# Patient Record
Sex: Male | Born: 1960 | Race: White | Hispanic: No | Marital: Married | Smoking: Current every day smoker
Health system: Southern US, Community
[De-identification: ages and names within clinical notes are randomized; demographics above are authoritative.]

## PROBLEM LIST (undated history)

## (undated) DIAGNOSIS — E785 Hyperlipidemia, unspecified: Secondary | ICD-10-CM

## (undated) DIAGNOSIS — I739 Peripheral vascular disease, unspecified: Secondary | ICD-10-CM

## (undated) HISTORY — PX: SHOULDER SURGERY: SHX246

## (undated) HISTORY — DX: Hyperlipidemia, unspecified: E78.5

## (undated) HISTORY — DX: Peripheral vascular disease, unspecified: I73.9

## (undated) HISTORY — PX: OTHER SURGICAL HISTORY: SHX169

## (undated) HISTORY — PX: TONSILLECTOMY: SUR1361

---

## 2001-02-23 ENCOUNTER — Emergency Department (HOSPITAL_COMMUNITY): Admission: EM | Admit: 2001-02-23 | Discharge: 2001-02-23 | Payer: Self-pay | Admitting: Emergency Medicine

## 2016-03-27 DIAGNOSIS — G5603 Carpal tunnel syndrome, bilateral upper limbs: Secondary | ICD-10-CM | POA: Insufficient documentation

## 2016-12-31 LAB — LIPID PANEL
Cholesterol: 165 mg/dL (ref 0–200)
HDL: 66 mg/dL (ref 35–70)
LDL CALC: 88 mg/dL
Triglycerides: 63 mg/dL (ref 40–160)

## 2017-01-06 ENCOUNTER — Ambulatory Visit: Payer: Self-pay | Admitting: Nurse Practitioner

## 2017-01-06 ENCOUNTER — Ambulatory Visit (INDEPENDENT_AMBULATORY_CARE_PROVIDER_SITE_OTHER)
Admission: RE | Admit: 2017-01-06 | Discharge: 2017-01-06 | Disposition: A | Discharge status: Home / Self Care | Payer: BLUE CROSS/BLUE SHIELD | Source: Ambulatory Visit | Attending: Nurse Practitioner | Admitting: Nurse Practitioner

## 2017-01-06 ENCOUNTER — Ambulatory Visit (INDEPENDENT_AMBULATORY_CARE_PROVIDER_SITE_OTHER): Payer: BLUE CROSS/BLUE SHIELD | Admitting: Nurse Practitioner

## 2017-01-06 ENCOUNTER — Other Ambulatory Visit: Payer: Self-pay | Admitting: Nurse Practitioner

## 2017-01-06 ENCOUNTER — Encounter: Payer: Self-pay | Admitting: Nurse Practitioner

## 2017-01-06 VITALS — BP 164/86 | HR 75 | Temp 98.3°F | Ht 69.0 in | Wt 162.0 lb

## 2017-01-06 DIAGNOSIS — G8929 Other chronic pain: Secondary | ICD-10-CM | POA: Diagnosis not present

## 2017-01-06 DIAGNOSIS — M5416 Radiculopathy, lumbar region: Secondary | ICD-10-CM

## 2017-01-06 DIAGNOSIS — I739 Peripheral vascular disease, unspecified: Secondary | ICD-10-CM | POA: Diagnosis not present

## 2017-01-06 DIAGNOSIS — M25552 Pain in left hip: Secondary | ICD-10-CM

## 2017-01-06 DIAGNOSIS — F17200 Nicotine dependence, unspecified, uncomplicated: Secondary | ICD-10-CM

## 2017-01-06 DIAGNOSIS — M51369 Other intervertebral disc degeneration, lumbar region without mention of lumbar back pain or lower extremity pain: Secondary | ICD-10-CM

## 2017-01-06 DIAGNOSIS — M5136 Other intervertebral disc degeneration, lumbar region: Secondary | ICD-10-CM | POA: Diagnosis not present

## 2017-01-06 MED ORDER — GABAPENTIN 100 MG PO CAPS: 100.0000 mg | ORAL_CAPSULE | Freq: Three times a day (TID) | ORAL | 1 refills | Status: DC

## 2017-01-06 MED ORDER — ATORVASTATIN CALCIUM 20 MG PO TABS: 20.0000 mg | ORAL_TABLET | Freq: Every day | ORAL | 3 refills | Status: DC

## 2017-01-06 MED ORDER — ATORVASTATIN CALCIUM 40 MG PO TABS: 40.0000 mg | ORAL_TABLET | Freq: Every day | ORAL | 3 refills | Status: DC

## 2017-01-06 MED ORDER — PREDNISONE 10 MG (21) PO TBPK: ORAL_TABLET | ORAL | 0 refills | Status: DC

## 2017-01-06 MED ORDER — CILOSTAZOL 100 MG PO TABS: 100.0000 mg | ORAL_TABLET | Freq: Two times a day (BID) | ORAL | 3 refills | Status: DC

## 2017-01-06 NOTE — Progress Notes (Signed)
Pre visit review using our clinic review tool, if applicable. No additional management support is needed unless otherwise documented below in the visit note. 

## 2017-01-06 NOTE — Patient Instructions (Addendum)
Go to basement for x-ray. You will be called with results.  Please sign release to get lab results from wellness visit.   PAD vs Lumbar radiculopathy? Or a combination of both. Refer to vascular surgeon if severe PAD per ABI.  Intermittent Claudication Intermittent claudication is pain in your leg that occurs when you walk or exercise and goes away when you rest. The pain can occur in one or both legs. What are the causes? Intermittent claudication is caused by the buildup of plaque within the major arteries in the body (atherosclerosis). The plaque, which makes arteries stiff and narrow, prevents enough blood from reaching your leg muscles. The pain occurs when you walk or exercise because your muscles need more blood when you are moving and exercising. What increases the risk? Risk factors include:  A family history of atherosclerosis.  A personal history of stroke or heart disease.  Older age.  Being inactive or overweight.  Smoking cigarettes.  Having another health condition such as:  Diabetes.  High blood pressure.  High cholesterol. What are the signs or symptoms? Your hip or leg may:  Ache.  Cramp.  Feel tight.  Feel weak.  Feel heavy. Over time, you may feel pain in your calf, thigh, or hip. How is this diagnosed? Your health care provider may diagnose intermittent claudication based on your symptoms and medical history. Your health care provider may also do tests to learn more about your condition. These may include:  Blood tests.  An ultrasound.  Imaging tests such as angiography, magnetic resonance angiography (MRA), and computed tomography angiography (CTA). How is this treated? You may be treated for problems such as:  High blood pressure.  High cholesterol.  Diabetes. Other treatments may include:  Lifestyle changes such as:  Starting an exercise program.  Losing weight.  Quitting smoking.  Medicines to help restore blood flow  through your legs.  Blood vessel surgery (angioplasty) to restore blood flow if your intermittent claudication is caused by severe peripheral artery disease. Follow these instructions at home:  Manage any other health conditions you have.  Eat a diet low in saturated fats and calories to maintain a healthy weight.  Quit smoking, if you smoke.  Take medicines only as directed by your health care provider.  If your health care provider recommended an exercise program for you, follow it as directed. Your exercise program may involve:  Walking three or more times a week.  Walking until you have certain symptoms of intermittent claudication.  Resting until symptoms go away.  Gradually increasing walking time to about 50 minutes a day. Contact a health care provider if: Your condition is not getting better or is getting worse. Get help right away if:  You have chest pain.  You have difficulty breathing.  You develop arm weakness.  You have trouble speaking.  Your face begins to droop. This information is not intended to replace advice given to you by your health care provider. Make sure you discuss any questions you have with your health care provider. Document Released: 08/15/2004 Document Revised: 03/20/2016 Document Reviewed: 01/19/2014 Elsevier Interactive Patient Education  2017 [employer]

## 2017-01-06 NOTE — Progress Notes (Signed)
Subjective:    Patient ID: Eddie Chan, male    DOB: 12-11-1960, 56 y.o.   MRN: [id number]  Patient presents today for establish care (new patient)  Back Pain  This is a chronic problem. Episode onset: 6months ago. The problem occurs intermittently. The problem has been gradually worsening since onset. Pain location: left gluteal and hip. The quality of the pain is described as aching and shooting. The pain radiates to the left foot and left knee. The pain is at a severity of 10/10. The pain is severe. The pain is worse during the night. The symptoms are aggravated by lying down (and walking). Associated symptoms include leg pain, numbness, paresthesias and tingling. Pertinent negatives include no abdominal pain, bladder incontinence, bowel incontinence, chest pain, dysuria, fever, headaches, paresis, pelvic pain, perianal numbness, weakness or weight loss. 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 He has tried NSAIDs for the symptoms. The treatment provided no relief.  wears wallet on right side.   CPE done last week, labs done.  Immunizations: (TDAP, Hep C screen, Pneumovax, Influenza, zoster)  Health Maintenance  Topic Date Due  .  Hepatitis C: One time screening is recommended by Center for Disease Control  (CDC) for  adults born from [birth year] through 1966.   02-May-1961  . HIV Screening  04/30/1975  . Colon Cancer Screening  04/29/2010  . Flu Shot  01/24/2017*  . Tetanus Vaccine  03/27/2026  *Topic was postponed. The date shown is not the original due date.   Diet:regular Weight:  Wt Readings from Last 3 Encounters:  01/06/17 162 lb (73.5 kg)   Exercise:none Fall Risk: Fall Risk  01/06/2017  Falls in the past year? Yes  Number falls in past yr: 1  Injury with Fall? No   Home Safety:home with wife Depression/Suicide: Depression screen [facility] 2/9 01/06/2017  Decreased Interest 0  Down, Depressed, Hopeless 0  PHQ - 2 Score 0   No flowsheet data  found. Colonoscopy (every 5-31yrs, >50-9yrs):needed PSA (yearly, >32yrs):needed Vision:needed Dental:needed Sexual History (birth control, marital status, STD):married, sexually active  Medications and allergies reviewed with patient and updated if appropriate.  Patient Active Problem List   Diagnosis Date Noted  . DDD (degenerative disc disease), lumbar 01/06/2017  . Tobacco use disorder 01/06/2017  . Chronic radicular pain of lower back 01/06/2017  . Chronic left hip pain 01/06/2017  . Intermittent claudication (HCC) 01/06/2017    No current outpatient prescriptions on file prior to visit.   No current facility-administered medications on file prior to visit.     History reviewed. No pertinent past medical history.  Past Surgical History:  Procedure Laterality Date  . SHOULDER SURGERY      Social History   Social History  . Marital status: Single    Spouse name: N/A  . Number of children: N/A  . Years of education: N/A   Social History Main Topics  . Smoking status: Current Every Day Smoker    Packs/day: 0.50    Years: 20.00    Types: Cigarettes  . Smokeless tobacco: Never Used     Comment: quit in past then resumed, nothing used  . Alcohol use 1.8 oz/week    3 Cans of beer per week     Comment: social  . Drug use: No  . Sexual activity: Yes    Birth control/ protection: None   Other Topics Concern  . None   Social History Narrative  . None    Family  History  Problem Relation Age of Onset  . Cancer Mother     lung cancer  . Heart disease Father   . Diabetes Father         Review of Systems  Constitutional: Negative for fever, malaise/fatigue and weight loss.  HENT: Negative for congestion and sore throat.   Eyes:       Negative for visual changes  Respiratory: Negative for cough and shortness of breath.   Cardiovascular: Negative for chest pain, palpitations and leg swelling.  Gastrointestinal: Negative for abdominal pain, blood in stool,  bowel incontinence, constipation, diarrhea and heartburn.  Genitourinary: Negative for bladder incontinence, dysuria, frequency, pelvic pain and urgency.  Musculoskeletal: Positive for back pain. Negative for falls, joint pain and myalgias.  Skin: Negative for rash.  Neurological: Positive for tingling, numbness and paresthesias. Negative for dizziness, sensory change, weakness and headaches.  Endo/Heme/Allergies: Does not bruise/bleed easily.  Psychiatric/Behavioral: Negative for depression, substance abuse and suicidal ideas. The patient is not nervous/anxious.     Objective:   Vitals:   01/06/17 1016  BP: (!) 164/86  Pulse: 75  Temp: 98.3 F (36.8 C)    Body mass index is 23.92 kg/m.   Physical Examination:  Physical Exam  Constitutional: He is oriented to person, place, and time. No distress.  Neck: Neck supple. Thyromegaly present.  Cardiovascular: Normal rate, regular rhythm and normal heart sounds.   Pulmonary/Chest: Effort normal and breath sounds normal.  Musculoskeletal: He exhibits tenderness. He exhibits no edema.       Right hip: Normal.       Left hip: He exhibits tenderness. He exhibits normal range of motion, normal strength and no bony tenderness.       Right knee: Normal.       Left knee: Normal.       Right ankle: Normal.       Left ankle: Normal.       Lumbar back: He exhibits tenderness and pain. He exhibits normal range of motion, no bony tenderness, no edema, no spasm and normal pulse.       Right upper leg: Normal.       Left upper leg: Normal.       Right lower leg: Normal.       Left lower leg: Normal.       Right foot: Normal.       Left foot: Normal.  Lymphadenopathy:    He has no cervical adenopathy.  Neurological: He is alert and oriented to person, place, and time. No cranial nerve deficit. Gait normal. Coordination normal.  Decreased microfilament to dorsal aspect of left foot. Negative straight leg raise.  Vitals  reviewed.   ASSESSMENT and PLAN:  Eddie Chan was seen today for establish care.  Diagnoses and all orders for this visit:  Intermittent claudication (HCC) -     gabapentin (NEURONTIN) 100 MG capsule; Take 1 capsule (100 mg total) by mouth 3 (three) times daily. -     [place] ABI W/WO TBI; Future -     cilostazol (PLETAL) 100 MG tablet; Take 1 tablet (100 mg total) by mouth 2 (two) times daily. before or 2hrs after meal -     Discontinue: atorvastatin (LIPITOR) 20 MG tablet; Take 1 tablet (20 mg total) by mouth daily at 6 PM. -     atorvastatin (LIPITOR) 40 MG tablet; Take 1 tablet (40 mg total) by mouth daily.  Chronic left hip pain -     DG Lumbar Spine  Complete; Future -     Cancel: DG HIP UNILAT WITH PELVIS 1V LEFT; Future -     gabapentin (NEURONTIN) 100 MG capsule; Take 1 capsule (100 mg total) by mouth 3 (three) times daily. -     Discontinue: predniSONE (STERAPRED UNI-PAK 21 TAB) 10 MG (21) TBPK tablet; Take by mouth as directed. -     US ABI W/WO TBI; Future -     atorvastatin (LIPITOR) 40 MG tablet; Take 1 tablet (40 mg total) by mouth daily.  Chronic radicular pain of lower back -     DG Lumbar Spine Complete; Future -     Cancel: DG HIP UNILAT WITH PELVIS 1V LEFT; Future -     gabapentin (NEURONTIN) 100 MG capsule; Take 1 capsule (100 mg total) by mouth 3 (three) times daily. -     Discontinue: predniSONE (STERAPRED UNI-PAK 21 TAB) 10 MG (21) TBPK tablet; Take by mouth as directed. -     US ABI W/WO TBI; Future -     atorvastatin (LIPITOR) 40 MG tablet; Take 1 tablet (40 mg total) by mouth daily.  Tobacco use disorder -     US ABI W/WO TBI; Future -     cilostazol (PLETAL) 100 MG tablet; Take 1 tablet (100 mg total) by mouth 2 (two) times daily. 30mins before or 2hrs after meal -     Discontinue: atorvastatin (LIPITOR) 20 MG tablet; Take 1 tablet (20 mg total) by mouth daily at 6 PM. -     atorvastatin (LIPITOR) 40 MG tablet; Take 1 tablet (40 mg total) by mouth  daily.  DDD (degenerative disc disease), lumbar   No problem-specific Assessment & Plan notes found for this encounter.     Follow up: Return in about 2 weeks (around 01/20/2017) for left hip and back pain.  Alysia Pennaharlotte Phelix Fudala, NP

## 2017-01-08 ENCOUNTER — Telehealth: Payer: Self-pay | Admitting: Nurse Practitioner

## 2017-01-08 NOTE — Telephone Encounter (Signed)
The pts wife call earlier checking on the [facility] [place] ABI W/WO TBI-60. I gave her the information to [facility] to check and see if they have scheduled him an appointment yet and she said that they acted like they would not be able to do this. Please advise. Thanks [employer]

## 2017-01-09 NOTE — Addendum Note (Signed)
Addended by: Alysia PennaNCHE, Codylee Patil L on: 01/09/2017 09:01 AM   Modules accepted: Orders

## 2017-01-12 NOTE — Telephone Encounter (Signed)
Order fixed by Claris Gowercharlotte, [facility].

## 2017-01-15 ENCOUNTER — Ambulatory Visit
Admission: RE | Admit: 2017-01-15 | Discharge: 2017-01-15 | Disposition: A | Discharge status: Home / Self Care | Payer: BLUE CROSS/BLUE SHIELD | Source: Ambulatory Visit | Attending: Nurse Practitioner | Admitting: Nurse Practitioner

## 2017-01-15 DIAGNOSIS — M25552 Pain in left hip: Secondary | ICD-10-CM

## 2017-01-15 DIAGNOSIS — M5416 Radiculopathy, lumbar region: Secondary | ICD-10-CM

## 2017-01-15 DIAGNOSIS — I739 Peripheral vascular disease, unspecified: Secondary | ICD-10-CM

## 2017-01-15 DIAGNOSIS — F17200 Nicotine dependence, unspecified, uncomplicated: Secondary | ICD-10-CM

## 2017-01-15 DIAGNOSIS — G8929 Other chronic pain: Secondary | ICD-10-CM

## 2017-01-20 ENCOUNTER — Ambulatory Visit: Payer: BLUE CROSS/BLUE SHIELD | Admitting: Nurse Practitioner

## 2017-01-26 ENCOUNTER — Telehealth: Payer: Self-pay | Admitting: Nurse Practitioner

## 2017-01-26 ENCOUNTER — Encounter: Payer: Self-pay | Admitting: Nurse Practitioner

## 2017-01-26 NOTE — Telephone Encounter (Signed)
Spoke with pt spouse (on [place]). She said his left foot is in severe pain and really need something to help with this pain. Pt has an appt with you on 01/28/17 and I sent a massage to check on referral already. Please advise.

## 2017-01-26 NOTE — Progress Notes (Signed)
Abstracted result and sent to scan  

## 2017-01-26 NOTE — Telephone Encounter (Signed)
Wife called stating that she thought follow up on wed was about patients foot.  Requesting call back in regard.

## 2017-01-27 NOTE — Telephone Encounter (Signed)
appt is set on 01/28/17.

## 2017-01-27 NOTE — Telephone Encounter (Signed)
Needs OV.  

## 2017-01-28 ENCOUNTER — Encounter: Payer: Self-pay | Admitting: Nurse Practitioner

## 2017-01-28 ENCOUNTER — Ambulatory Visit (INDEPENDENT_AMBULATORY_CARE_PROVIDER_SITE_OTHER): Payer: BLUE CROSS/BLUE SHIELD | Admitting: Nurse Practitioner

## 2017-01-28 ENCOUNTER — Other Ambulatory Visit (INDEPENDENT_AMBULATORY_CARE_PROVIDER_SITE_OTHER): Payer: BLUE CROSS/BLUE SHIELD

## 2017-01-28 VITALS — BP 134/72 | HR 82 | Temp 98.1°F | Ht 69.0 in | Wt 164.0 lb

## 2017-01-28 DIAGNOSIS — F17200 Nicotine dependence, unspecified, uncomplicated: Secondary | ICD-10-CM

## 2017-01-28 DIAGNOSIS — M5416 Radiculopathy, lumbar region: Secondary | ICD-10-CM | POA: Diagnosis not present

## 2017-01-28 DIAGNOSIS — I739 Peripheral vascular disease, unspecified: Secondary | ICD-10-CM

## 2017-01-28 DIAGNOSIS — G8929 Other chronic pain: Secondary | ICD-10-CM

## 2017-01-28 DIAGNOSIS — M25552 Pain in left hip: Secondary | ICD-10-CM | POA: Diagnosis not present

## 2017-01-28 DIAGNOSIS — I70222 Atherosclerosis of native arteries of extremities with rest pain, left leg: Secondary | ICD-10-CM | POA: Insufficient documentation

## 2017-01-28 LAB — CBC
HEMATOCRIT: 43.9 % (ref 39.0–52.0)
Hemoglobin: 15.4 g/dL (ref 13.0–17.0)
MCHC: 35.1 g/dL (ref 30.0–36.0)
MCV: 97.8 fl (ref 78.0–100.0)
PLATELETS: 194 10*3/uL (ref 150.0–400.0)
RBC: 4.49 Mil/uL (ref 4.22–5.81)
RDW: 12.9 % (ref 11.5–15.5)
WBC: 9.2 10*3/uL (ref 4.0–10.5)

## 2017-01-28 LAB — COMPREHENSIVE METABOLIC PANEL
ALT: 24 U/L (ref 0–53)
AST: 20 U/L (ref 0–37)
Albumin: 4.2 g/dL (ref 3.5–5.2)
Alkaline Phosphatase: 55 U/L (ref 39–117)
BUN: 7 mg/dL (ref 6–23)
CALCIUM: 9.5 mg/dL (ref 8.4–10.5)
CO2: 32 mEq/L (ref 19–32)
CREATININE: 1.13 mg/dL (ref 0.40–1.50)
Chloride: 104 mEq/L (ref 96–112)
GFR: 71.15 mL/min (ref >60.00)
GLUCOSE: 97 mg/dL (ref 70–99)
Potassium: 4 mEq/L (ref 3.5–5.1)
Sodium: 140 mEq/L (ref 135–145)
TOTAL PROTEIN: 6.7 g/dL (ref 6.0–8.3)
Total Bilirubin: 0.3 mg/dL (ref 0.2–1.2)

## 2017-01-28 MED ORDER — PRAVASTATIN SODIUM 40 MG PO TABS: 40.0000 mg | ORAL_TABLET | Freq: Every day | ORAL | 3 refills | Status: DC

## 2017-01-28 MED ORDER — ASPIRIN EC 81 MG PO TBEC: 81.0000 mg | DELAYED_RELEASE_TABLET | Freq: Every day | ORAL | Status: DC

## 2017-01-28 MED ORDER — CO Q-10 100 MG PO CAPS: 1.0000 | ORAL_CAPSULE | Freq: Every day | ORAL | 3 refills | Status: DC

## 2017-01-28 MED ORDER — GABAPENTIN 300 MG PO CAPS: 300.0000 mg | ORAL_CAPSULE | Freq: Three times a day (TID) | ORAL | 3 refills | Status: DC

## 2017-01-28 MED ORDER — TRAMADOL HCL 50 MG PO TABS: 100.0000 mg | ORAL_TABLET | Freq: Every evening | ORAL | 0 refills | Status: DC | PRN

## 2017-01-28 NOTE — Progress Notes (Signed)
Pre visit review using our clinic review tool, if applicable. No additional management support is needed unless otherwise documented below in the visit note. 

## 2017-01-28 NOTE — Patient Instructions (Addendum)
You have an appointment with [facility] 02/05/2017 at 10:30am. [address], [place], [place] [address] [phone].   Patient declined tramadol prescription due to risk of impairing his driving. He has a Music therapist.  Peripheral Vascular Disease Peripheral vascular disease (PVD) is a disease of the blood vessels that are not part of your heart and brain. A simple term for PVD is poor circulation. In most cases, PVD narrows the blood vessels that carry blood from your heart to the rest of your body. This can result in a decreased supply of blood to your arms, legs, and internal organs, like your stomach or kidneys. However, it most often affects a person's lower legs and feet. There are two types of PVD.  Organic PVD. This is the more common type. It is caused by damage to the structure of blood vessels.  Functional PVD. This is caused by conditions that make blood vessels contract and tighten (spasm). Without treatment, PVD tends to get worse over time. PVD can also lead to acute ischemic limb. This is when an arm or limb suddenly has trouble getting enough blood. This is a medical emergency. Follow these instructions at home:  Take medicines only as told by your doctor.  Do not use any tobacco products, including cigarettes, chewing tobacco, or electronic cigarettes. If you need help quitting, ask your doctor.  Lose weight if you are overweight, and maintain a healthy weight as told by your doctor.  Eat a diet that is low in fat and cholesterol. If you need help, ask your doctor.  Exercise regularly. Ask your doctor for some good activities for you.  Take good care of your feet.  Wear comfortable shoes that fit well.  Check your feet often for any cuts or sores. Contact a doctor if:  You have cramps in your legs while walking.  You have leg pain when you are at rest.  You have coldness in a leg or foot.  Your skin changes.  You are unable to get  or have an erection (erectile dysfunction).  You have cuts or sores on your feet that are not healing. Get help right away if:  Your arm or leg turns cold and blue.  Your arms or legs become red, warm, swollen, painful, or numb.  You have chest pain or trouble breathing.  You suddenly have weakness in your face, arm, or leg.  You become very confused or you cannot speak.  You suddenly have a very bad headache.  You suddenly cannot see. This information is not intended to replace advice given to you by your health care provider. Make sure you discuss any questions you have with your health care provider. Document Released: 01/07/2010 Document Revised: 03/20/2016 Document Reviewed: 03/23/2014 Elsevier Interactive Patient Education  2017 [employer].

## 2017-01-28 NOTE — Progress Notes (Signed)
Normal results

## 2017-01-28 NOTE — Progress Notes (Signed)
Subjective:  Patient ID: Eddie Chan, male    DOB: February 11, 1961  Age: 56 y.o. MRN: [id number]  CC: Follow-up (2 wk fu for left foot pain/none of the meds work,BP,cholesterol,gabapentin did not owrk. )   HPI Intermittent claudication:  Presents with worsening left leg pain, scheduled to see vascular surgeon next month. He could not be scheduled sooner. he is unable to sleep, nor ambulate more 1000 feet without stopping due to left calf pain. FH of CAD and PAD with stents (father and brother)  Tobacco use: States it is unlikely that he will stop smoking. And is not interested in quitting.  He was unable to tolerate Lipitor after 1dose. Caused back pain per patient.  Outpatient Medications Prior to Visit  Medication Sig Dispense Refill  . cilostazol (PLETAL) 100 MG tablet Take 1 tablet (100 mg total) by mouth 2 (two) times daily. before or 2hrs after meal 60 tablet 3  . gabapentin (NEURONTIN) 100 MG capsule Take 1 capsule (100 mg total) by mouth 3 (three) times daily. 30 capsule 1  . atorvastatin (LIPITOR) 40 MG tablet Take 1 tablet (40 mg total) by mouth daily. (Patient not taking: Reported on 01/28/2017) 30 tablet 3   No facility-administered medications prior to visit.     ROS See HPI  Objective:  BP 134/72   Pulse 82   Temp 98.1 F (36.7 C)   Ht  (1.753 m)   Wt 164 lb (74.4 kg)   SpO2 95%   BMI 24.22 kg/m   BP Readings from Last 3 Encounters:  01/28/17 134/72  01/06/17 (!) 164/86    Wt Readings from Last 3 Encounters:  01/28/17 164 lb (74.4 kg)  01/06/17 162 lb (73.5 kg)    Physical Exam  Constitutional: He is oriented to person, place, and time.  Cardiovascular: Normal rate.   Pulmonary/Chest: Effort normal.  Musculoskeletal: He exhibits tenderness. He exhibits no edema.  Left LE with Ruby color and cool temperature.  Neurological: He is alert and oriented to person, place, and time.  Skin: Skin is dry. There is erythema.  Vitals  reviewed.   Lab Results  Component Value Date   CHOL 165 12/31/2016   TRIG 63 12/31/2016   HDL 66 12/31/2016   LDLCALC 88 12/31/2016    US Arterial Seg Multiple  Result Date: 01/15/2017 CLINICAL DATA:  Left calf claudication. Left foot rest pain. Atherosclerosis, hyperlipidemia, previous tobacco abuse. EXAM: NONINVASIVE PHYSIOLOGIC VASCULAR STUDY OF BILATERAL LOWER EXTREMITIES TECHNIQUE: Evaluation of both lower extremities was performed at rest, including calculation of ankle-brachial indices, multiple segmental pressure evaluation, segmental Doppler and segmental pulse volume recording. COMPARISON:  None. FINDINGS: Right ABI:  0.86 Left ABI:  0.42 Right Lower Extremity: Triphasic waveforms throughout. Toe brachial index 0.69. Left Lower Extremity: Segmental pressures suggest primarily inflow occlusive disease. There are monophasic waveforms throughout. Toe brachial index 0.33. Pulse volume regarding demonstrates attenuated waveforms throughout the left lower extremity compared to the right. IMPRESSION: 1. 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 2. Mild right lower extremity arterial occlusive disease at rest. Electronically Signed   By: Corlis Leak M.D.   On: 01/15/2017 15:08    Assessment & Plan:   Eddie Chan was seen today for follow-up.  Diagnoses and all orders for this visit:  PAD (peripheral artery disease) (HCC) -     gabapentin (NEURONTIN) 300 MG capsule; Take 1 capsule (300 mg total) by mouth 3 (three) times daily. -  pravastatin (PRAVACHOL) 40 MG tablet; Take 1 tablet (40 mg total) by mouth daily. -     aspirin EC 81 MG tablet; Take 1 tablet (81 mg total) by mouth daily. -     Coenzyme Q10 (CO Q-10) 100 MG CAPS; Take 1 capsule by mouth daily. -     Comprehensive metabolic panel; Future -     CBC; Future  Intermittent claudication (HCC) -     gabapentin (NEURONTIN) 300 MG capsule; Take 1 capsule (300 mg total) by mouth 3 (three)  times daily. -     pravastatin (PRAVACHOL) 40 MG tablet; Take 1 tablet (40 mg total) by mouth daily. -     aspirin EC 81 MG tablet; Take 1 tablet (81 mg total) by mouth daily. -     Coenzyme Q10 (CO Q-10) 100 MG CAPS; Take 1 capsule by mouth daily. -     Comprehensive metabolic panel; Future -     CBC; Future  Chronic left hip pain -     gabapentin (NEURONTIN) 300 MG capsule; Take 1 capsule (300 mg total) by mouth 3 (three) times daily. -     Discontinue: traMADol (ULTRAM) 50 MG tablet; Take 2 tablets (100 mg total) by mouth at bedtime as needed. -     Coenzyme Q10 (CO Q-10) 100 MG CAPS; Take 1 capsule by mouth daily.  Chronic radicular pain of lower back -     gabapentin (NEURONTIN) 300 MG capsule; Take 1 capsule (300 mg total) by mouth 3 (three) times daily. -     Discontinue: traMADol (ULTRAM) 50 MG tablet; Take 2 tablets (100 mg total) by mouth at bedtime as needed. -     Coenzyme Q10 (CO Q-10) 100 MG CAPS; Take 1 capsule by mouth daily.  Tobacco use disorder   I have discontinued Eddie Chan gabapentin, atorvastatin, and traMADol. I am also having him start on gabapentin, pravastatin, aspirin EC, and Co Q-10. Additionally, I am having him maintain his cilostazol.  Meds ordered this encounter  Medications  . gabapentin (NEURONTIN) 300 MG capsule    Sig: Take 1 capsule (300 mg total) by mouth 3 (three) times daily.    Dispense:  90 capsule    Refill:  3    Order Specific Question:   Supervising Provider    Answer:   Eddie Chan [[id number]]  . pravastatin (PRAVACHOL) 40 MG tablet    Sig: Take 1 tablet (40 mg total) by mouth daily.    Dispense:  30 tablet    Refill:  3    Order Specific Question:   Supervising Provider    Answer:   Eddie Chan [[id number]]  . aspirin EC 81 MG tablet    Sig: Take 1 tablet (81 mg total) by mouth daily.    Dispense:  30 tablet    Order Specific Question:   Supervising Provider    Answer:   Eddie Chan [[id number]]  . DISCONTD:  traMADol (ULTRAM) 50 MG tablet    Sig: Take 2 tablets (100 mg total) by mouth at bedtime as needed.    Dispense:  10 tablet    Refill:  0    Order Specific Question:   Supervising Provider    Answer:   Eddie Chan [[id number]]  . Coenzyme Q10 (CO Q-10) 100 MG CAPS    Sig: Take 1 capsule by mouth daily.    Dispense:  30 each    Refill:  3    Order Specific Question:  Supervising Provider    Answer:   Cassandria Anger [[id number]]    Follow-up: Return if symptoms worsen or fail to improve.  Wilfred Lacy, NP

## 2017-02-13 ENCOUNTER — Telehealth: Payer: Self-pay | Admitting: Nurse Practitioner

## 2017-02-13 NOTE — Telephone Encounter (Signed)
Incoming records from [facility].

## 2017-03-09 ENCOUNTER — Telehealth: Payer: Self-pay | Admitting: Nurse Practitioner

## 2017-03-09 DIAGNOSIS — G8929 Other chronic pain: Secondary | ICD-10-CM

## 2017-03-09 DIAGNOSIS — M5136 Other intervertebral disc degeneration, lumbar region: Secondary | ICD-10-CM

## 2017-03-09 DIAGNOSIS — M5416 Radiculopathy, lumbar region: Secondary | ICD-10-CM

## 2017-03-09 DIAGNOSIS — M79672 Pain in left foot: Secondary | ICD-10-CM

## 2017-03-09 DIAGNOSIS — G629 Polyneuropathy, unspecified: Secondary | ICD-10-CM | POA: Insufficient documentation

## 2017-03-09 NOTE — Telephone Encounter (Signed)
Pt called in and said that Dr Milderd MeagerNestor Curz told pt that he needs to see a Neurologist for his foot. He would like to know if Claris GowerCharlotte can put a referral in for a neurologist

## 2017-03-09 NOTE — Telephone Encounter (Signed)
Referral entered  

## 2017-03-09 NOTE — Telephone Encounter (Signed)
Please advise 

## 2017-03-10 NOTE — Telephone Encounter (Signed)
Pt verbalized understand.  

## 2017-03-11 ENCOUNTER — Encounter (HOSPITAL_COMMUNITY): Payer: BLUE CROSS/BLUE SHIELD

## 2017-03-11 ENCOUNTER — Encounter: Payer: BLUE CROSS/BLUE SHIELD | Admitting: Vascular Surgery

## 2017-03-17 ENCOUNTER — Encounter: Payer: Self-pay | Admitting: Neurology

## 2017-03-17 ENCOUNTER — Telehealth: Payer: Self-pay | Admitting: Nurse Practitioner

## 2017-03-17 NOTE — Telephone Encounter (Signed)
Pt wife wants a new referral to Neurology and [facility] [address]

## 2017-03-17 NOTE — Telephone Encounter (Signed)
Spoke to pt - referral sent to [facility]

## 2017-06-04 ENCOUNTER — Telehealth: Payer: Self-pay | Admitting: Nurse Practitioner

## 2017-06-04 DIAGNOSIS — I739 Peripheral vascular disease, unspecified: Secondary | ICD-10-CM

## 2017-06-04 MED ORDER — PRAVASTATIN SODIUM 40 MG PO TABS: 40.0000 mg | ORAL_TABLET | Freq: Every day | ORAL | 3 refills | Status: DC

## 2017-06-04 NOTE — Telephone Encounter (Signed)
Notified pt wife [facility] has [facility]

## 2017-06-04 NOTE — Telephone Encounter (Signed)
Pt called regarding his pravastatin (PRAVACHOL) 40 MG tablet He would like a refill  Last seen in 01/28/17

## 2017-06-05 ENCOUNTER — Encounter: Payer: Self-pay | Admitting: *Deleted

## 2017-06-05 ENCOUNTER — Encounter: Payer: Self-pay | Admitting: Neurology

## 2017-06-05 ENCOUNTER — Ambulatory Visit (INDEPENDENT_AMBULATORY_CARE_PROVIDER_SITE_OTHER): Payer: BLUE CROSS/BLUE SHIELD | Admitting: Neurology

## 2017-06-05 VITALS — BP 148/80 | HR 64 | Ht 69.0 in | Wt 164.1 lb

## 2017-06-05 DIAGNOSIS — M792 Neuralgia and neuritis, unspecified: Secondary | ICD-10-CM

## 2017-06-05 DIAGNOSIS — Z720 Tobacco use: Secondary | ICD-10-CM | POA: Diagnosis not present

## 2017-06-05 DIAGNOSIS — G63 Polyneuropathy in diseases classified elsewhere: Secondary | ICD-10-CM | POA: Diagnosis not present

## 2017-06-05 DIAGNOSIS — I739 Peripheral vascular disease, unspecified: Secondary | ICD-10-CM | POA: Diagnosis not present

## 2017-06-05 MED ORDER — NORTRIPTYLINE HCL 10 MG PO CAPS: ORAL_CAPSULE | ORAL | 5 refills | Status: DC

## 2017-06-05 NOTE — Patient Instructions (Addendum)
Start nortriptyline 10mg  at bedtime for 2 week, then increase to 2 tablet at bedtime Start over the counter lidocaine ointment to your left foot NCS/EMG of the left leg Encouraged to stop smoking  Return to clinic 4 months

## 2017-06-05 NOTE — Progress Notes (Signed)
[facility] Neurology Division Clinic Note - Initial Visit   Date: 06/05/17  Eddie Chan MRN: [id number] DOB: May 08, 1961   Dear Alysia Penna, NP:  Thank you for your kind referral of Eddie Chan for consultation of left foot pain. Although his history is well known to you, please allow [place] to reiterate it for the purpose of our medical record. The patient was accompanied to the clinic by wife who also provides collateral information.     History of Present Illness: Eddie Chan is a 56 y.o. right-handed Caucasian male with peripheral arterial disease s/p left iliac stenting, tobacco use, and alcohol use presenting for evaluation of left foot pain.  Starting early 2018, he started having left leg pain which was exacerbated by exercise and exertion.  He was found to have left iliac disease and underwent stenting in May which provided relief of claudication, but he has burning pain over the medial lower leg, dorsum, and into the left 1st - 3rd toes on the left.  He has constant numbness, but there is extreme hypersensitivity to touch.  Pain is worse when a blanket touches his skin or when he wears socks and shoes. When he showers, he has severe pain as if there are sharp needles poking it. Pain is ranked as 8/10 when severe.  He denies weakness of the toes or imbalance.  He tried gabapentin which did not provide relief and made him very sleepy where he could not function (works as a dump Naval architect).  His Vascular Surgery clinic notes were reviewed.  Past Medical History:  Diagnosis Date  . Peripheral arterial disease [facility])     Past Surgical History:  Procedure Laterality Date  . Left iliac stenting    . SHOULDER SURGERY       Medications:  Outpatient Encounter Prescriptions as of 06/05/2017  Medication Sig  . cilostazol (PLETAL) 100 MG tablet Take 1 tablet (100 mg total) by mouth 2 (two) times daily. before or 2hrs after meal  . clopidogrel (PLAVIX) 75  MG tablet Take 75 mg by mouth daily.  Marland Kitchen ibuprofen (ADVIL,MOTRIN) 200 MG tablet Take 200 mg by mouth every 6 (six) hours as needed.  . naproxen sodium (ANAPROX) 220 MG tablet Take 220 mg by mouth 2 (two) times daily with a meal.  . pravastatin (PRAVACHOL) 40 MG tablet Take 1 tablet (40 mg total) by mouth daily.  . [DISCONTINUED] aspirin EC 81 MG tablet Take 1 tablet (81 mg total) by mouth daily.  . [DISCONTINUED] Coenzyme Q10 (CO Q-10) 100 MG CAPS Take 1 capsule by mouth daily.  . [DISCONTINUED] gabapentin (NEURONTIN) 300 MG capsule Take 1 capsule (300 mg total) by mouth 3 (three) times daily.   No facility-administered encounter medications on file as of 06/05/2017.      Allergies:  Allergies  Allergen Reactions  . Penicillins Other (See Comments)    Break out    Family History: Family History  Problem Relation Age of Onset  . Cancer Mother        lung cancer  . Heart disease Father   . Diabetes Father     Social History: Social History  Substance Use Topics  . Smoking status: Current Every Day Smoker    Packs/day: 1.50    Years: 40.00    Types: Cigarettes  . Smokeless tobacco: Never Used     Comment: quit in past then resumed, nothing used, previously smoked 3PPD  x + 20 years  . Alcohol use  1.8 oz/week    3 Cans of beer per week     Comment: social, 2 beers nightly (12 oz)    Social History   Social History Narrative   He drives a dump truck.  Lives at wife and mother.     He has two children.   Highest level of education: 7th grade    Review of Systems:  CONSTITUTIONAL: No fevers, chills, night sweats, or weight loss.   EYES: No visual changes or eye pain ENT: No hearing changes.  No history of nose bleeds.   RESPIRATORY: No cough, wheezing and shortness of breath.   CARDIOVASCULAR: Negative for chest pain, and palpitations.   GI: Negative for abdominal discomfort, blood in stools or black stools.  No recent change in bowel habits.   GU:  No history of  incontinence.   MUSCLOSKELETAL: No history of joint pain or swelling.  No myalgias.  +left foot pain SKIN: Negative for lesions, rash, and itching.   HEMATOLOGY/ONCOLOGY: Negative for prolonged bleeding, bruising easily, and swollen nodes.  No history of cancer.   ENDOCRINE: Negative for cold or heat intolerance, polydipsia or goiter.   PSYCH:  No depression or anxiety symptoms.   NEURO: As Above.   Vital Signs:  BP (!) 148/80   Pulse 64   Ht 5\' 9"  (1.753 m)   Wt 164 lb 2 oz (74.4 kg)   SpO2 96%   BMI 24.24 kg/m    General Medical Exam:   General:  Well appearing, comfortable.   Eyes/ENT: see cranial nerve examination.   Neck: No masses appreciated.  Full range of motion without tenderness.  No carotid bruits. Respiratory:  Clear to auscultation, good air entry bilaterally.   Cardiac:  Regular rate and rhythm, no murmur.   Extremities:  No deformities, edema, or skin discoloration.  Skin:  No rashes or lesions.  Neurological Exam: MENTAL STATUS including orientation to time, place, person, recent and remote memory, attention span and concentration, language, and fund of knowledge is normal.  Speech is not dysarthric.  CRANIAL NERVES: II:  No visual field defects.  Unremarkable fundi.   III-IV-VI: Pupils equal round and reactive to light.  Normal conjugate, extra-ocular eye movements in all directions of gaze.  No nystagmus.  No ptosis.   V:  Normal facial sensation.   VII:  Normal facial symmetry and movements.  VIII:  Normal hearing and vestibular function.   IX-X:  Normal palatal movement.   XI:  Normal shoulder shrug and head rotation.   XII:  Normal tongue strength and range of motion, no deviation or fasciculation.  MOTOR:  No atrophy, fasciculations or abnormal movements.  No pronator drift.  Tone is normal.    Right Upper Extremity:    Left Upper Extremity:    Deltoid  5/5   Deltoid  5/5   Biceps  5/5   Biceps  5/5   Triceps  5/5   Triceps  5/5   Wrist extensors   5/5   Wrist extensors  5/5   Wrist flexors  5/5   Wrist flexors  5/5   Finger extensors  5/5   Finger extensors  5/5   Finger flexors  5/5   Finger flexors  5/5   Dorsal interossei  5/5   Dorsal interossei  5/5   Abductor pollicis  5/5   Abductor pollicis  5/5   Tone (Ashworth scale)  0  Tone (Ashworth scale)  0   Right Lower Extremity:  Left Lower Extremity:    Hip flexors  5/5   Hip flexors  5/5   Hip extensors  5/5   Hip extensors  5/5   Knee flexors  5/5   Knee flexors  5/5   Knee extensors  5/5   Knee extensors  5/5   Dorsiflexors  5/5   Dorsiflexors  4/5   Eversion 5/5  Eversion 4/5  Inversion 5/5  Inversion 5/5  Plantarflexors  5/5   Plantarflexors  5/5   Toe extensors  5/5   Toe extensors  4/5   Toe flexors  5/5   Toe flexors  4/5   Tone (Ashworth scale)  0  Tone (Ashworth scale)  0   MSRs:  Right                                                                 Left brachioradialis 2+  brachioradialis 2+  biceps 2+  biceps 2+  triceps 2+  triceps 2+  patellar 2+  patellar 2+  ankle jerk 1+  ankle jerk 1+  Hoffman no  Hoffman no  plantar response down  plantar response down   SENSORY:  Hyperesthesia to temperature, light touch, pin prick, and vibration over the dorsum of the foot and into the first 3 toes on the left.   COORDINATION/GAIT: Normal finger-to- nose-finger.  Gait is unassisted and slightly antalgic with the left foot inverted as to prevent weight bearing on the medial foot.  He is able to stand on heels and toes.    IMPRESSION: 1.  Left neuropathic foot pain with hyperesthesia over the superficial peroneal nerve, weakness with dorsiflexion, eversion, and toe flexion suggesting he may have has some ischemic involvement of his more proximal sciatic nerve, as both tibial and peroneal nerves seem to be involved.  Symptoms were present before he had iliac stenting and therefore are unrelated to his procedure.  I recommend NCS/EMG to help localize symptoms.  For  symptom management, he will be started on nortriptyline 10mg  at bedtime for 2 week, then increase to 2 tablet at bedtime.  OTC lidocaine ointment was also recommended.    2.  Peripheral arterial disease s/p left iliac stent  3.  Long history of tobacco use since the age of 56.  Previously up to 3ppd now down to 1.5 ppd and is not keen on stopping.  Smoking cessation instruction/counseling given:  counseled patient on the dangers of tobacco use, advised patient to stop smoking, and reviewed strategies to maximize success  4.  Letter provided to limit night time driving due to potential side effects of medication  Return to clinic in 4 months.   The duration of this appointment visit was 45 minutes of face-to-face time with the patient.  Greater than 50% of this time was spent in counseling, explanation of diagnosis, planning of further management, and coordination of care.   Thank you for allowing me to participate in patient's care.  If I can answer any additional questions, I would be pleased to do so.    Sincerely,    Meghann Landing K. Allena KatzPatel, DO

## 2017-06-16 NOTE — Telephone Encounter (Signed)
Error

## 2017-06-18 ENCOUNTER — Encounter: Payer: Self-pay | Admitting: Nurse Practitioner

## 2017-06-18 ENCOUNTER — Ambulatory Visit (INDEPENDENT_AMBULATORY_CARE_PROVIDER_SITE_OTHER): Payer: BLUE CROSS/BLUE SHIELD | Admitting: Nurse Practitioner

## 2017-06-18 ENCOUNTER — Other Ambulatory Visit (INDEPENDENT_AMBULATORY_CARE_PROVIDER_SITE_OTHER): Payer: BLUE CROSS/BLUE SHIELD

## 2017-06-18 VITALS — BP 146/80 | HR 61 | Temp 97.7°F | Ht 69.0 in | Wt 165.0 lb

## 2017-06-18 DIAGNOSIS — G629 Polyneuropathy, unspecified: Secondary | ICD-10-CM

## 2017-06-18 DIAGNOSIS — I739 Peripheral vascular disease, unspecified: Secondary | ICD-10-CM | POA: Diagnosis not present

## 2017-06-18 DIAGNOSIS — F17200 Nicotine dependence, unspecified, uncomplicated: Secondary | ICD-10-CM | POA: Diagnosis not present

## 2017-06-18 LAB — CBC
HCT: 45 % (ref 39.0–52.0)
Hemoglobin: 15.4 g/dL (ref 13.0–17.0)
MCHC: 34.3 g/dL (ref 30.0–36.0)
MCV: 97 fl (ref 78.0–100.0)
PLATELETS: 194 10*3/uL (ref 150.0–400.0)
RBC: 4.64 Mil/uL (ref 4.22–5.81)
RDW: 12.7 % (ref 11.5–15.5)
WBC: 7.4 10*3/uL (ref 4.0–10.5)

## 2017-06-18 LAB — BASIC METABOLIC PANEL
BUN: 9 mg/dL (ref 6–23)
CALCIUM: 9.7 mg/dL (ref 8.4–10.5)
CO2: 31 meq/L (ref 19–32)
Chloride: 102 mEq/L (ref 96–112)
Creatinine, Ser: 0.85 mg/dL (ref 0.40–1.50)
GFR: 98.7 mL/min (ref >60.00)
Glucose, Bld: 100 mg/dL — ABNORMAL HIGH (ref 70–99)
POTASSIUM: 4.3 meq/L (ref 3.5–5.1)
SODIUM: 139 meq/L (ref 135–145)

## 2017-06-18 LAB — VITAMIN B12: VITAMIN B 12: 257 pg/mL (ref 211–911)

## 2017-06-18 LAB — LIPID PANEL
Cholesterol: 127 mg/dL (ref 0–200)
HDL: 51.2 mg/dL (ref >39.00)
LDL CALC: 66 mg/dL (ref 0–99)
NONHDL: 75.48
Total CHOL/HDL Ratio: 2
Triglycerides: 47 mg/dL (ref 0.0–149.0)
VLDL: 9.4 mg/dL (ref 0.0–40.0)

## 2017-06-18 LAB — TSH: TSH: 1.56 u[IU]/mL (ref 0.35–4.50)

## 2017-06-18 LAB — FOLATE: Folate: 8.1 ng/mL (ref >5.9)

## 2017-06-18 MED ORDER — LIDOCAINE 5 % EX CREA: 1.0000 "application " | TOPICAL_CREAM | CUTANEOUS | Status: DC | PRN

## 2017-06-18 MED ORDER — PREGABALIN 25 MG PO CAPS: 25.0000 mg | ORAL_CAPSULE | Freq: Two times a day (BID) | ORAL | 0 refills | Status: DC

## 2017-06-18 NOTE — Patient Instructions (Addendum)
Ok to order cologuard.   continue follow up with neurology and vascular surgeon  Go to basement for blood draw.  Continue follow up with Dr. Vincente Liberty and Dr. Sondra Come.  Avoid use of NSAIDs. Use tylenol only prn for pain.

## 2017-06-18 NOTE — Progress Notes (Signed)
Subjective:  Patient ID: Eddie Chan, male    DOB: 08-Nov-1960  Age: 56 y.o. MRN: [id number]  CC: Follow-up (medication refills/left foot still painful---has appt with 06/25/17 the check the foot out. )   HPI  PAD with neuropathy: Under care of Dr. Sondra Come (vascular surgeon) and neurology (Dr. Allena Katz) Has not been able to tolerate gabapentin and nortriptyline due to increased sedation. Resolved right leg claudication with left iliac stent, but has persistent numbness and tingling of left foot. Current use of pletal and palvix. Continuous tobacco and ETOH use. He has no interest in quitting. Denies any signs of GI bleed.  Outpatient Medications Prior to Visit  Medication Sig Dispense Refill  . cilostazol (PLETAL) 100 MG tablet Take 1 tablet (100 mg total) by mouth 2 (two) times daily. before or 2hrs after meal 60 tablet 3  . clopidogrel (PLAVIX) 75 MG tablet Take 75 mg by mouth daily.  5  . pravastatin (PRAVACHOL) 40 MG tablet Take 1 tablet (40 mg total) by mouth daily. 30 tablet 3  . ibuprofen (ADVIL,MOTRIN) 200 MG tablet Take 200 mg by mouth every 6 (six) hours as needed.    . naproxen sodium (ANAPROX) 220 MG tablet Take 220 mg by mouth 2 (two) times daily with a meal.    . nortriptyline (PAMELOR) 10 MG capsule Start nortriptyline 10mg  at bedtime for 2 week, then increase to 2 tablet at bedtime (Patient not taking: Reported on 06/18/2017) 60 capsule 5   No facility-administered medications prior to visit.     ROS See HPI  Objective:  BP (!) 146/80   Pulse 61   Temp 97.7 F (36.5 C)   Ht 5\' 9"  (1.753 m)   Wt 165 lb (74.8 kg)   SpO2 98%   BMI 24.37 kg/m   BP Readings from Last 3 Encounters:  06/18/17 (!) 146/80  06/05/17 (!) 148/80  01/28/17 134/72    Wt Readings from Last 3 Encounters:  06/18/17 165 lb (74.8 kg)  06/05/17 164 lb 2 oz (74.4 kg)  01/28/17 164 lb (74.4 kg)    Physical Exam  Constitutional: He is oriented to person, place, and time. No  distress.  Cardiovascular: Normal rate, regular rhythm, normal heart sounds and intact distal pulses.   Pulmonary/Chest: Effort normal and breath sounds normal.  Abdominal: Soft. Bowel sounds are normal.  Musculoskeletal: Normal range of motion. He exhibits no edema.  Neurological: He is alert and oriented to person, place, and time.  Diminished sensation on left dorsal foot.  Skin: Skin is warm and dry. No rash noted. No erythema.  Psychiatric: He has a normal mood and affect. His behavior is normal.  Vitals reviewed.   Lab Results  Component Value Date   WBC 7.4 06/18/2017   HGB 15.4 06/18/2017   HCT 45.0 06/18/2017   PLT 194.0 06/18/2017   GLUCOSE 100 (H) 06/18/2017   CHOL 127 06/18/2017   TRIG 47.0 06/18/2017   HDL 51.20 06/18/2017   LDLCALC 66 06/18/2017   ALT 24 01/28/2017   AST 20 01/28/2017   NA 139 06/18/2017   K 4.3 06/18/2017   CL 102 06/18/2017   CREATININE 0.85 06/18/2017   BUN 9 06/18/2017   CO2 31 06/18/2017   TSH 1.56 06/18/2017    US Arterial Seg Multiple  Result Date: 01/15/2017 CLINICAL DATA:  Left calf claudication. Left foot rest pain. Atherosclerosis, hyperlipidemia, previous tobacco abuse. EXAM: NONINVASIVE PHYSIOLOGIC VASCULAR STUDY OF BILATERAL LOWER EXTREMITIES TECHNIQUE: Evaluation of both lower  extremities was performed at rest, including calculation of ankle-brachial indices, multiple segmental pressure evaluation, segmental Doppler and segmental pulse volume recording. COMPARISON:  None. FINDINGS: Right ABI:  0.86 Left ABI:  0.42 Right Lower Extremity: Triphasic waveforms throughout. Toe brachial index 0.69. Left Lower Extremity: Segmental pressures suggest primarily inflow occlusive disease. There are monophasic waveforms throughout. Toe brachial index 0.33. Pulse volume regarding demonstrates attenuated waveforms throughout the left lower extremity compared to the right. IMPRESSION: 1. 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 2. Mild right lower extremity arterial occlusive disease at rest. Electronically Signed   By: Corlis Leak M.D.   On: 01/15/2017 15:08    Assessment & Plan:   Eddie Chan was seen today for follow-up.  Diagnoses and all orders for this visit:  PAD (peripheral artery disease) (HCC) -     Lipid panel; Future -     Basic metabolic panel; Future -     CBC; Future  Neuropathy -     Lipid panel; Future -     Basic metabolic panel; Future -     CBC; Future -     pregabalin (LYRICA) 25 MG capsule; Take 1 capsule (25 mg total) by mouth 2 (two) times daily. -     Lidocaine 5 % CREA; Apply 1 application topically every 2 (two) hours as needed. -     TSH; Future -     B12; Future -     Vitamin D 1,25 dihydroxy; Future -     Folate; Future  Tobacco use disorder   I have discontinued Eddie Chan ibuprofen, naproxen sodium, and nortriptyline. I am also having him start on pregabalin and Lidocaine. Additionally, I am having him maintain his cilostazol, pravastatin, and clopidogrel.  Meds ordered this encounter  Medications  . pregabalin (LYRICA) 25 MG capsule    Sig: Take 1 capsule (25 mg total) by mouth 2 (two) times daily.    Dispense:  30 capsule    Refill:  0    Order Specific Question:   Supervising Provider    Answer:   Tresa Garter [[id number]]  . Lidocaine 5 % CREA    Sig: Apply 1 application topically every 2 (two) hours as needed.    Order Specific Question:   Supervising Provider    Answer:   Tresa Garter [[id number]]    Follow-up: Return in about 4 weeks (around 07/16/2017) for neuropathy.  Alysia Penna, NP

## 2017-06-19 ENCOUNTER — Ambulatory Visit: Payer: BLUE CROSS/BLUE SHIELD | Admitting: Nurse Practitioner

## 2017-06-22 LAB — VITAMIN D 1,25 DIHYDROXY
VITAMIN D3 1, 25 (OH): 43 pg/mL
Vitamin D 1, 25 (OH)2 Total: 43 pg/mL (ref 18–72)

## 2017-06-25 ENCOUNTER — Encounter: Payer: BLUE CROSS/BLUE SHIELD | Admitting: Neurology

## 2017-06-30 ENCOUNTER — Telehealth: Payer: Self-pay | Admitting: Nurse Practitioner

## 2017-06-30 NOTE — Telephone Encounter (Signed)
Pt wife called stating the pts insurance company will not cover his pregabalin (LYRICA) 25 MG capsule  Can a PA be started?

## 2017-07-01 NOTE — Telephone Encounter (Signed)
PA for Lyrica 25mg  started  Demon Postell (Key: [id number]).

## 2017-07-06 NOTE — Telephone Encounter (Signed)
Spoke with PA department, they said this med is still under review and it can take up to 15 days after the PA started.

## 2017-07-09 NOTE — Telephone Encounter (Signed)
PA for this rx is denied. Please advise.

## 2017-07-09 NOTE — Telephone Encounter (Signed)
Please inform patient. I do not have any other alternative

## 2017-07-09 NOTE — Telephone Encounter (Signed)
Spoke with spouse, advise her of the result of PA and [place] response.   Advise pt to speak with the neurologist for more information about other med to help.   appt on 07/16/2017 is cancel--pt will call back to reschedule.

## 2017-07-14 ENCOUNTER — Encounter: Payer: BLUE CROSS/BLUE SHIELD | Admitting: Neurology

## 2017-07-16 ENCOUNTER — Ambulatory Visit: Payer: BLUE CROSS/BLUE SHIELD | Admitting: Nurse Practitioner

## 2017-07-23 ENCOUNTER — Telehealth: Payer: Self-pay | Admitting: Nurse Practitioner

## 2017-07-23 DIAGNOSIS — I739 Peripheral vascular disease, unspecified: Secondary | ICD-10-CM

## 2017-07-23 DIAGNOSIS — F17200 Nicotine dependence, unspecified, uncomplicated: Secondary | ICD-10-CM

## 2017-07-23 MED ORDER — CILOSTAZOL 100 MG PO TABS: 100.0000 mg | ORAL_TABLET | Freq: Two times a day (BID) | ORAL | 0 refills | Status: DC

## 2017-07-23 NOTE — Telephone Encounter (Signed)
Pt needs an appt for future refills. 

## 2017-07-23 NOTE — Telephone Encounter (Signed)
Pt wife called for the pt, he need a refill of the  cilostazol (PLETAL) 100 MG tablet  [facility] [address]  Would like this done today if possible, please advise

## 2017-08-28 ENCOUNTER — Telehealth: Payer: Self-pay | Admitting: Nurse Practitioner

## 2017-08-28 DIAGNOSIS — I739 Peripheral vascular disease, unspecified: Secondary | ICD-10-CM

## 2017-08-28 DIAGNOSIS — F17200 Nicotine dependence, unspecified, uncomplicated: Secondary | ICD-10-CM

## 2017-08-28 MED ORDER — CILOSTAZOL 100 MG PO TABS: 100.0000 mg | ORAL_TABLET | Freq: Two times a day (BID) | ORAL | 0 refills | Status: DC

## 2017-08-28 NOTE — Telephone Encounter (Signed)
Inform will send 30 day only no future refills will be given until appt...Raechel Chute/lmb

## 2017-08-28 NOTE — Telephone Encounter (Signed)
Pt wife called in said that pt need refill of the cilostazol (PLETAL) 100 MG tablet [[id number]][[id number]]   she said he completely out and needs it asap.  Told her that he would need appt.  She said that he has been working out of town and hasnt been able to come in?

## 2017-10-07 ENCOUNTER — Telehealth: Payer: Self-pay | Admitting: Nurse Practitioner

## 2017-10-07 NOTE — Telephone Encounter (Signed)
Copied from CRM [id number]. Topic: Quick Communication - See Telephone Encounter >> Oct 07, 2017  4:14 PM Floria RavelingStovall, Shana A wrote: CRM for notification. See Telephone encounter for: pt wife called in and pt is out on the rd,  he is not due to come back til next month.  He was a pt of [place].  I explain to pt wife that he was told last month that he would have to come in for appt for any more refills. I also explain to her that [place].  [facility] [place] [facility] [place] in [place]. Pt is aware he will have to est will new provider but not sure what to do about this med?  She wanted me to send over a message and see if pt could get enough to make it to [place] and then he can come in?        Best number  [phone]    10/07/17.

## 2017-10-08 NOTE — Telephone Encounter (Signed)
Per chart appt has not been set up yet. Appt must be made before a rx can be sent to pharmacy. Pls call pt/wife make an appt w/new provider then we will send a 30 day to whichever pharmacy he would like...Raechel Chute/lmb

## 2017-10-08 NOTE — Telephone Encounter (Signed)
Copied from CRM [phone]. Topic: Appointment Scheduling - Scheduling Inquiry for Clinic >> Oct 08, 2017  1:19 PM Debroah LoopLander, Lumin L wrote: Reason for CRM: Patient's wife Rosey Batheresa see's Dr. Okey Duprerawford and her husband was [facility]. She wants to know if Dr. Okey Duprerawford would be willing to take him on as a new "transfer" patient? [place] office is too far top see Nche.  Dr.Crawford would you want to accept patient has a transfer?

## 2017-10-09 ENCOUNTER — Telehealth: Payer: Self-pay | Admitting: Internal Medicine

## 2017-10-09 ENCOUNTER — Other Ambulatory Visit: Payer: Self-pay | Admitting: Nurse Practitioner

## 2017-10-09 NOTE — Telephone Encounter (Signed)
Refill not on protocol.

## 2017-10-09 NOTE — Telephone Encounter (Signed)
Copied from CRM [phone]. Topic: Inquiry >> Oct 09, 2017  1:20 PM Moton, Tresa EndoKelly, [place] wrote: Reason for CRM: Patient wife called again because she is a patient of Dr.Crawford and he husband would like to become a patient of hers as well. He is a former patient of Dr.Nche. Wife stated that the drive is to far for them to go to the [place]. That's why he would like to change doctors so that they will be coming to the same doctors office. If someone could please give her a call back about this matter at [phone]

## 2017-10-09 NOTE — Telephone Encounter (Signed)
He was informed about the need for an office visit 07/23/2017 and 08/28/2017. I will not be refilling the medication.

## 2017-10-09 NOTE — Telephone Encounter (Signed)
FYI  Advise pt's spouse that [facility]is [facility] wants to see him before she can give him any more refills, 08/28/2017 30 day supply sent and advise pt to make an appt before he runs out.   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 She she is concern and doesn't him to have blood clot.   They are trying to get in with Dr. Okey Duprerawford as well.

## 2017-10-09 NOTE — Telephone Encounter (Signed)
Would you be willing to see this patient to establish care? °

## 2017-10-09 NOTE — Telephone Encounter (Signed)
Copied from CRM [phone]. Topic: Quick Communication - See Telephone Encounter >> Oct 09, 2017  1:15 PM Everardo PacificMoton, Whittaker Lenis, [place] wrote: CRM for notification. See Telephone encounter for: Patient wife called because her husband needs a refill on his Cilostazol. If someone could please give her a call back at [phone]  10/09/17.

## 2017-10-09 NOTE — Telephone Encounter (Signed)
This is duplicative, please close this encounter.

## 2017-10-12 ENCOUNTER — Telehealth: Payer: Self-pay | Admitting: Internal Medicine

## 2017-10-12 DIAGNOSIS — F17200 Nicotine dependence, unspecified, uncomplicated: Secondary | ICD-10-CM

## 2017-10-12 DIAGNOSIS — I739 Peripheral vascular disease, unspecified: Secondary | ICD-10-CM

## 2017-10-12 MED ORDER — PRAVASTATIN SODIUM 40 MG PO TABS: 40.0000 mg | ORAL_TABLET | Freq: Every day | ORAL | 0 refills | Status: DC

## 2017-10-12 MED ORDER — CLOPIDOGREL BISULFATE 75 MG PO TABS: 75.0000 mg | ORAL_TABLET | Freq: Every day | ORAL | 0 refills | Status: DC

## 2017-10-12 MED ORDER — CILOSTAZOL 100 MG PO TABS: 100.0000 mg | ORAL_TABLET | Freq: Two times a day (BID) | ORAL | 0 refills | Status: DC

## 2017-10-12 NOTE — Telephone Encounter (Signed)
Should go to PCP.

## 2017-10-12 NOTE — Telephone Encounter (Signed)
I will not be refilling lyrica. Please do not give any further refill under my name. He has not maintained his f/up appts. Thank you

## 2017-10-12 NOTE — Telephone Encounter (Signed)
Spoke with patient & his wife. The wife will call back and set up a Transfer care appointment to Dr.Crawford.

## 2017-10-12 NOTE — Telephone Encounter (Signed)
Per office policy sent 30 day to local pharmacy until appt on Maintenance meds. pls advise on Lyrica.....Raechel Chute/lmb

## 2017-10-12 NOTE — Telephone Encounter (Signed)
Copied from CRM #[id number]. Topic: Quick Communication - Rx Refill/Question >> Oct 12, 2017 12:56 PM Leafy Roobinson, Norma J wrote: Has the patient contacted their pharmacy? no (Agent: If no, request that the patient contact the pharmacy for the refill.) pt has a new pt appt with dr Okey Duprecrawford on 11-18-17.  Pt needs cilostazol 100 mg #60 with one refill and pravastatin#60 and lyrica 25 mg #60 Pharmacy (with phone number or street name):[facility] [place] [phone] Agent: Please be advised that RX refills may take up to 3 business days. We ask that you follow-up with your pharmacy.

## 2017-10-12 NOTE — Addendum Note (Signed)
Addended by: Deatra JamesBRAND, Caroline Matters M on: 10/12/2017 01:17 PM   Modules accepted: Orders

## 2017-10-12 NOTE — Telephone Encounter (Signed)
fine

## 2017-11-18 ENCOUNTER — Encounter: Payer: Self-pay | Admitting: Internal Medicine

## 2017-11-18 ENCOUNTER — Ambulatory Visit (INDEPENDENT_AMBULATORY_CARE_PROVIDER_SITE_OTHER): Payer: BLUE CROSS/BLUE SHIELD | Admitting: Internal Medicine

## 2017-11-18 VITALS — BP 138/78 | HR 70 | Temp 98.1°F | Ht 69.0 in | Wt 166.0 lb

## 2017-11-18 DIAGNOSIS — F17200 Nicotine dependence, unspecified, uncomplicated: Secondary | ICD-10-CM

## 2017-11-18 DIAGNOSIS — G629 Polyneuropathy, unspecified: Secondary | ICD-10-CM | POA: Diagnosis not present

## 2017-11-18 DIAGNOSIS — Z0000 Encounter for general adult medical examination without abnormal findings: Secondary | ICD-10-CM | POA: Diagnosis not present

## 2017-11-18 DIAGNOSIS — I739 Peripheral vascular disease, unspecified: Secondary | ICD-10-CM | POA: Diagnosis not present

## 2017-11-18 NOTE — Patient Instructions (Signed)
You can try capsaicin cream over the counter for the pain in the foot.   We do not need labs today.  Think about quitting smoking as this is the best thing you can do for the health.

## 2017-11-18 NOTE — Progress Notes (Signed)
   Subjective:    Patient ID: Eddie Chan, male    DOB: 07/30/1961, 57 y.o.   MRN: [id number]  HPI The patient is a 57 YO man coming in for physical and transfer care from another provider. He denies new problems. Has been struggling with PAD for some time.   [facility], [facility], social history reviewed and updated.   Review of Systems  Constitutional: Negative for activity change, appetite change, diaphoresis, fatigue and unexpected weight change.  HENT: Negative.   Eyes: Negative.   Respiratory: Negative for cough, chest tightness and shortness of breath.   Cardiovascular: Negative for chest pain, palpitations and leg swelling.  Gastrointestinal: Negative for abdominal distention, abdominal pain, constipation, diarrhea, nausea and vomiting.  Musculoskeletal: Positive for myalgias.  Skin: Negative.   Neurological: Positive for numbness. Negative for dizziness, tremors, syncope and weakness.  Psychiatric/Behavioral: Negative.       Objective:   Physical Exam  Constitutional: He is oriented to person, place, and time. He appears well-developed and well-nourished.  HENT:  Head: Normocephalic and atraumatic.  Eyes: EOM are normal.  Neck: Normal range of motion.  Cardiovascular: Normal rate and regular rhythm.  Pulmonary/Chest: Effort normal and breath sounds normal. No respiratory distress. He has no wheezes. He has no rales.  Abdominal: Soft. Bowel sounds are normal. He exhibits no distension. There is no tenderness. There is no rebound.  Musculoskeletal: He exhibits no edema.  Neurological: He is alert and oriented to person, place, and time. Coordination normal.  Skin: Skin is warm and dry.  Psychiatric: He has a normal mood and affect.   Vitals:   11/18/17 0951  BP: 138/78  Pulse: 70  Temp: 98.1 F (36.7 C)  TempSrc: Oral  SpO2: 98%  Weight: 166 lb (75.3 kg)  Height: 5\' 9"  (1.753 m)      Assessment & Plan:

## 2017-11-20 DIAGNOSIS — Z0000 Encounter for general adult medical examination without abnormal findings: Secondary | ICD-10-CM | POA: Insufficient documentation

## 2017-11-20 NOTE — Assessment & Plan Note (Signed)
Flu declines and tetanus up to date. Cologuard ordered. Counseled about sun safety and mole surveillance. Declines hiv screening. Given 10 year screening recommendations. Counseled about need to stop smoking.

## 2017-11-20 NOTE — Assessment & Plan Note (Signed)
Cannot take gabapentin or lyrica due to sedation. Will remove from list.

## 2017-11-20 NOTE — Assessment & Plan Note (Signed)
Taking cilostazol. Still smoking and we talked about how this is his largest risk for progression.

## 2017-11-20 NOTE — Assessment & Plan Note (Signed)
He is still smoking.Take cilostazol. He has had prior stenting and is high risk for recurrence of blockages due to ongoing smoking.

## 2017-11-20 NOTE — Assessment & Plan Note (Signed)
Time spent counseling about tobacco usage: 3 minutes. I have asked about smoking and is smoking same as usual. The patient is advised to quit. The patient is not willing to quit. They would like to try to quit in the next 6 months. We will follow up with them in 6 months. 

## 2017-12-14 ENCOUNTER — Other Ambulatory Visit: Payer: Self-pay | Admitting: Internal Medicine

## 2017-12-14 DIAGNOSIS — I739 Peripheral vascular disease, unspecified: Secondary | ICD-10-CM

## 2017-12-14 NOTE — Telephone Encounter (Signed)
Routing to dr crawford---last labs aug/2018--previously prescribed by former pcp, [facility]---are you ok with sending refill?  Please advise, thanks

## 2017-12-17 ENCOUNTER — Telehealth: Payer: Self-pay | Admitting: Internal Medicine

## 2017-12-17 NOTE — Telephone Encounter (Signed)
Patient states that he is unable to get his results. Had a blockage in his left leg and had a stent in for him. States he had a test done and no one has called him with results and when he calls they do not give him his results stating that doctor has not signed off on the results. States that he is having horrible foot pain and wants to know if another test can be [place]

## 2017-12-17 NOTE — Telephone Encounter (Signed)
I'm not sure what test he wants. Would recommend a visit to see if more testing is appropriate. Or he can call the office he already had testing done or make a follow up with them.

## 2017-12-17 NOTE — Telephone Encounter (Signed)
Contacted and informed patient of MD response making a visit to see Dr. Okey Duprerawford

## 2017-12-17 NOTE — Telephone Encounter (Signed)
Copied from CRM [id number]. Topic: Quick Communication - See Telephone Encounter >> Dec 17, 2017  1:06 PM Cipriano BunkerLambe, Annette S wrote: CRM for notification. See Telephone encounter for:   Rosey Batheresa [facility] and they have not heard anything from them.. They have called but won't discuss with them..  Please call patient and let him know what the results were.  12/17/17.

## 2017-12-24 ENCOUNTER — Other Ambulatory Visit: Payer: Self-pay | Admitting: Internal Medicine

## 2017-12-24 DIAGNOSIS — I739 Peripheral vascular disease, unspecified: Secondary | ICD-10-CM

## 2017-12-24 DIAGNOSIS — F17200 Nicotine dependence, unspecified, uncomplicated: Secondary | ICD-10-CM

## 2017-12-24 MED ORDER — CILOSTAZOL 100 MG PO TABS: 100.0000 mg | ORAL_TABLET | Freq: Two times a day (BID) | ORAL | 0 refills | Status: DC

## 2017-12-24 NOTE — Telephone Encounter (Signed)
LOV 11/18/2017 with Dr. Okey Duprerawford / Refill request for Cilostazol / Refilled per protocol

## 2017-12-24 NOTE — Telephone Encounter (Signed)
Copied from CRM [id number]. Topic: Quick Communication - Rx Refill/Question >> Dec 24, 2017 10:06 AM Guinevere FerrariMorris, Ala Capri E, NT wrote: Medication: cilostazol (PLETAL) 100 MG tablet   Has the patient contacted their pharmacy? Yes. Patient has been out of medication for over a week.    (Agent: If no, request that the patient contact the pharmacy for the refill.)   Preferred Pharmacy (with phone number or street name): [id number]- [place], [place] - [address] [phone] (Phone) [phone] (Fax)     Agent: Please be advised that RX refills may take up to 3 business days. We ask that you follow-up with your pharmacy.

## 2017-12-28 ENCOUNTER — Encounter: Payer: Self-pay | Admitting: Internal Medicine

## 2017-12-28 ENCOUNTER — Ambulatory Visit (INDEPENDENT_AMBULATORY_CARE_PROVIDER_SITE_OTHER): Payer: BLUE CROSS/BLUE SHIELD | Admitting: Internal Medicine

## 2017-12-28 VITALS — BP 132/70 | HR 85 | Temp 98.6°F | Ht 69.0 in | Wt 163.0 lb

## 2017-12-28 DIAGNOSIS — I739 Peripheral vascular disease, unspecified: Secondary | ICD-10-CM | POA: Diagnosis not present

## 2017-12-28 NOTE — Progress Notes (Signed)
   Subjective:    Patient ID: Eddie Chan, male    DOB: 08/19/1961, 57 y.o.   MRN: [id number]  HPI The patient is a 57 YO man coming in for frustration with his vascular office. He was not able to get in with the group in town quickly enough so stenting was done [facility]. He had a recent visit with them about 1 month ago and a procedure done to check circulation. His doctor left abruptly from that group and he never heard about the results. He has called a lot and they have not given him the results. He is still having pain in the left leg.   Review of Systems  Constitutional: Positive for activity change. Negative for appetite change, chills, fatigue, fever and unexpected weight change.  Respiratory: Negative.   Cardiovascular: Negative.        Claudication  Gastrointestinal: Negative.   Musculoskeletal: Negative for arthralgias, back pain, gait problem, myalgias, neck pain and neck stiffness.  Skin: Negative.   Neurological: Negative.   Psychiatric/Behavioral: Negative.       Objective:   Physical Exam  Constitutional: He is oriented to person, place, and time. He appears well-developed and well-nourished.  HENT:  Head: Normocephalic and atraumatic.  Eyes: EOM are normal.  Neck: Normal range of motion.  Cardiovascular: Normal rate and regular rhythm.  Pulmonary/Chest: Effort normal and breath sounds normal. No respiratory distress. He has no wheezes. He has no rales.  Abdominal: Soft. He exhibits no distension. There is no tenderness. There is no rebound.  Musculoskeletal: He exhibits no edema.  Neurological: He is alert and oriented to person, place, and time. Coordination normal.  Skin: Skin is warm and dry.   Vitals:   12/28/17 1534  BP: 132/70  Pulse: 85  Temp: 98.6 F (37 C)  TempSrc: Oral  SpO2: 93%  Weight: 163 lb (73.9 kg)  Height: 5\' 9"  (1.753 m)      Assessment & Plan:

## 2017-12-28 NOTE — Patient Instructions (Signed)
Think about taking zyrtec daily to help the drainage to get the cough better quicker.   Stop smoking as this is the best thing you can do for your health.

## 2017-12-29 NOTE — Assessment & Plan Note (Signed)
Referral done to vascular surgery to establish in town. He signed records release form to get results from the group [place] [facility]. He is encouraged that he also can request his medical records from the group to get results.

## 2018-01-06 NOTE — Telephone Encounter (Signed)
We received the results from [facility] about the stent. Dr. Okey Duprerawford looked over results and I LVM informing patient that everything is unchanged and stent is still open. Let them know to call back with any questions

## 2018-01-29 ENCOUNTER — Other Ambulatory Visit: Payer: Self-pay | Admitting: Internal Medicine

## 2018-01-29 DIAGNOSIS — F17200 Nicotine dependence, unspecified, uncomplicated: Secondary | ICD-10-CM

## 2018-01-29 DIAGNOSIS — I739 Peripheral vascular disease, unspecified: Secondary | ICD-10-CM

## 2018-02-04 ENCOUNTER — Encounter: Payer: BLUE CROSS/BLUE SHIELD | Admitting: Vascular Surgery

## 2018-04-14 ENCOUNTER — Ambulatory Visit (INDEPENDENT_AMBULATORY_CARE_PROVIDER_SITE_OTHER): Payer: BLUE CROSS/BLUE SHIELD | Admitting: Internal Medicine

## 2018-04-14 ENCOUNTER — Encounter: Payer: Self-pay | Admitting: Internal Medicine

## 2018-04-14 VITALS — BP 140/90 | HR 97 | Temp 98.3°F | Ht 69.0 in | Wt 159.0 lb

## 2018-04-14 DIAGNOSIS — I739 Peripheral vascular disease, unspecified: Secondary | ICD-10-CM

## 2018-04-14 DIAGNOSIS — F17200 Nicotine dependence, unspecified, uncomplicated: Secondary | ICD-10-CM

## 2018-04-14 DIAGNOSIS — R030 Elevated blood-pressure reading, without diagnosis of hypertension: Secondary | ICD-10-CM | POA: Diagnosis not present

## 2018-04-14 NOTE — Patient Instructions (Addendum)
We will have you stop taking the cilostazol for about 1 week prior to DOT physical to help with blood pressure.   We will get you in with the vascular surgeon.   Stop smoking when you are able.

## 2018-04-14 NOTE — Progress Notes (Signed)
   Subjective:    Patient ID: Eddie Chan, male    DOB: 01/01/1961, 57 y.o.   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 He is wanting to get this under control so he can pass. He is concerned that the new cilostazol is causing the problem. He has never had high BP before and has never had problems with his DOT physical. Denies chest pains or SOB or abdominal pains.  He also needs new referral for monitoring of prior vascular stenting. He did not like other provider and they were out of network and far away. He is still smoking but is down to 1-2 packs per day from 3 packs per day at maximum. He does not feel able to quit at this time but he is working on getting down and off cigarettes.   Review of Systems  Constitutional: Negative.   HENT: Negative.   Eyes: Negative.   Respiratory: Negative for cough, chest tightness and shortness of breath.   Cardiovascular: Negative for chest pain, palpitations and leg swelling.  Gastrointestinal: Negative for abdominal distention, abdominal pain, constipation, diarrhea, nausea and vomiting.  Musculoskeletal: Negative.   Skin: Negative.   Neurological: Negative.   Psychiatric/Behavioral: Negative.       Objective:   Physical Exam  Constitutional: He is oriented to person, place, and time. He appears well-developed and well-nourished.  HENT:  Head: Normocephalic and atraumatic.  Eyes: EOM are normal.  Neck: Normal range of motion.  Cardiovascular: Normal rate and regular rhythm.  Pulmonary/Chest: Effort normal and breath sounds normal. No respiratory distress. He has no wheezes. He has no rales.  Abdominal: Soft. Bowel sounds are normal. He exhibits no distension. There is no tenderness. There is no rebound.  Musculoskeletal: He exhibits no edema.  Neurological: He is alert and oriented to person, place, and time. Coordination normal.  Skin: Skin is warm and dry.    Psychiatric: He has a normal mood and affect.   Vitals:   04/14/18 1036  BP: 140/90  Pulse: 97  Temp: 98.3 F (36.8 C)  TempSrc: Oral  SpO2: 96%  Weight: 159 lb (72.1 kg)  Height: 5\' 9"  (1.753 m)      Assessment & Plan:

## 2018-04-15 ENCOUNTER — Other Ambulatory Visit: Payer: Self-pay | Admitting: Internal Medicine

## 2018-04-15 ENCOUNTER — Telehealth: Payer: Self-pay

## 2018-04-15 MED ORDER — AMLODIPINE BESYLATE 10 MG PO TABS: 10.0000 mg | ORAL_TABLET | Freq: Every day | ORAL | 3 refills | Status: DC

## 2018-04-15 NOTE — Telephone Encounter (Signed)
Sent in amlodipine 10 mg, take 1 pill daily for BP. Can take 2 weeks to get in your system.

## 2018-04-15 NOTE — Telephone Encounter (Signed)
Patient's wife informed

## 2018-04-15 NOTE — Telephone Encounter (Signed)
Copied from CRM [id number]. Topic: Inquiry >> Apr 15, 2018 11:12 AM Yvonna Alanisobinson, Andra M wrote: Reason for CRM: Patient's wife Delmar Landaueresa Barker called stating that the patient now wants Dr. Okey Duprerawford to prescribe a blood pressure medication. Patient's wife states that the patient and Dr. Okey Duprerawford discussed this on yesterday at his visit. The patient did not want the medication at the time, but has changed his mind due to his BP being elevated. Patient's wife would,like a call back from Dr. Okey Duprerawford.       Thank You!!!

## 2018-04-16 DIAGNOSIS — R030 Elevated blood-pressure reading, without diagnosis of hypertension: Secondary | ICD-10-CM | POA: Insufficient documentation

## 2018-04-16 NOTE — Assessment & Plan Note (Signed)
BP in the 150s/90s at DOT physical and rx for amlodipine 10 mg daily to help get BP at goal so he can pass and continue his current employment.

## 2018-04-16 NOTE — Assessment & Plan Note (Signed)
Time spent counseling about tobacco usage: 3 minutes. I have asked about smoking and is smoking less than usual. The patient is advised to quit. The patient is not willing to quit. They would like to try to quit in the next 6 months. We will follow up with them in 6 months.  

## 2018-04-16 NOTE — Assessment & Plan Note (Signed)
Referral to vascular surgery for monitoring. He is on plavix and cilostazol for prevention of symptoms and we discussed again how continued smoking is detrimental to his health and vascular health in particular.

## 2018-05-18 ENCOUNTER — Ambulatory Visit: Payer: BLUE CROSS/BLUE SHIELD | Admitting: Internal Medicine

## 2018-05-18 ENCOUNTER — Telehealth: Payer: Self-pay | Admitting: Internal Medicine

## 2018-05-18 NOTE — Telephone Encounter (Signed)
Copied from CRM [id number]. Topic: Quick Communication - See Telephone Encounter >> May 18, 2018 11:46 AM Jolayne Hainesaylor, Brittany L wrote: CRM for notification. See Telephone encounter for: 05/18/18.  Patient's wife called and wants to know does he need to still be taking both of his blood thinner medications? She said the pharmacy brought it to his attention that he was on two.  cilostazol (PLETAL) 100 MG tablet & she does not know the name of the other one. Please advise.

## 2018-05-18 NOTE — Telephone Encounter (Signed)
Patients wife informed of MD response and stated understanding  

## 2018-05-18 NOTE — Telephone Encounter (Signed)
Plavix is the other medication. Please advise

## 2018-05-18 NOTE — Telephone Encounter (Signed)
Yes , should take both

## 2018-05-19 ENCOUNTER — Encounter: Payer: Self-pay | Admitting: Vascular Surgery

## 2018-05-19 ENCOUNTER — Ambulatory Visit (INDEPENDENT_AMBULATORY_CARE_PROVIDER_SITE_OTHER): Payer: BLUE CROSS/BLUE SHIELD | Admitting: Vascular Surgery

## 2018-05-19 VITALS — BP 139/86 | HR 66 | Resp 20 | Ht 69.0 in | Wt 160.2 lb

## 2018-05-19 DIAGNOSIS — I739 Peripheral vascular disease, unspecified: Secondary | ICD-10-CM

## 2018-05-19 NOTE — Progress Notes (Signed)
Patient name: Eddie Chan MRN: [id number] DOB: 03/01/1961 Sex: male   REASON FOR CONSULT:    Peripheral vascular disease.  The consult is requested by Dr. Hillard Danker  HPI:   Eddie Chan is a pleasant 57 y.o. male, who presents for follow-up of his peripheral vascular disease.  He reportedly has significant pain in the left leg and underwent angioplasty and stenting by Dr. Ivin Booty  in [facility].  He presents to have his follow-up here as I believe this position is no longer practicing there.  He states that he had angioplasty and stenting of the left leg but is not sure where the stent was placed.  He has had some paresthesias in the left leg since that time.  However his symptoms improved after his revascularization.  His main complaint now is pain in both calves which is brought on by ambulation and relieved with rest.  This is been going on for about 3 weeks.  His symptoms are stable.  There are no other aggravating or alleviating factors.  He does smoke greater than a pack per day of cigarettes and is been smoking for a long time.  He is on Pletal, Plavix, and a statin.  I do not get any history of rest pain or nonhealing ulcers.  He does have some hip esthesia is in the left foot.  This is not new.  Past Medical History:  Diagnosis Date  . Hyperlipidemia   . Peripheral arterial disease (HCC)     Family History  Problem Relation Age of Onset  . Cancer Mother        lung cancer  . Heart disease Father   . Diabetes Father     SOCIAL HISTORY: Social History   Socioeconomic History  . Marital status: Married    Spouse name: Not on file  . Number of children: 2  . Years of education: 7  . Highest education level: Not on file  Occupational History  . Occupation: truck Public librarian Needs  . Financial resource strain: Not on file  . Food insecurity:    Worry: Not on file    Inability: Not on file  . Transportation needs:    Medical: Not  on file    Non-medical: Not on file  Tobacco Use  . Smoking status: Current Every Day Smoker    Packs/day: 1.50    Years: 40.00    Pack years: 60.00    Types: Cigarettes  . Smokeless tobacco: Never Used  . Tobacco comment: quit in past then resumed, nothing used, previously smoked 3PPD  x + 20 years  Substance and Sexual Activity  . Alcohol use: Yes    Alcohol/week: 1.8 oz    Types: 3 Cans of beer per week    Comment: social, 2 beers nightly (12 oz)   . Drug use: No  . Sexual activity: Yes    Birth control/protection: None  Lifestyle  . Physical activity:    Days per week: Not on file    Minutes per session: Not on file  . Stress: Not on file  Relationships  . Social connections:    Talks on phone: Not on file    Gets together: Not on file    Attends religious service: Not on file    Active member of club or organization: Not on file    Attends meetings of clubs or organizations: Not on file    Relationship status: Not on file  .  Intimate partner violence:    Fear of current or ex partner: Not on file    Emotionally abused: Not on file    Physically abused: Not on file    Forced sexual activity: Not on file  Other Topics Concern  . Not on file  Social History Narrative   He drives a dump truck.  Lives at wife and mother in law in a one story home.    He has two children.   Highest level of education: 7th grade    Allergies  Allergen Reactions  . Penicillins Other (See Comments)    Break out    Current Outpatient Medications  Medication Sig Dispense Refill  . amLODipine (NORVASC) 10 MG tablet Take 1 tablet (10 mg total) by mouth daily. 90 tablet 3  . cilostazol (PLETAL) 100 MG tablet TAKE 1 TABLET (100 MG TOTAL) BY MOUTH 2 (TWO) TIMES DAILY. BEFORE OR 2HRS AFTER MEAL 60 tablet 3  . clopidogrel (PLAVIX) 75 MG tablet TAKE 1 TABLET (75 MG TOTAL) BY MOUTH DAILY. 90 tablet 3  . pravastatin (PRAVACHOL) 40 MG tablet Take 1 tablet (40 mg total) by mouth daily. 90  tablet 3   No current facility-administered medications for this visit.     REVIEW OF SYSTEMS:  [X]  denotes positive finding, [ ]  denotes negative finding Cardiac  Comments:  Chest pain or chest pressure:    Shortness of breath upon exertion:    Short of breath when lying flat:    Irregular heart rhythm:        Vascular    Pain in calf, thigh, or hip brought on by ambulation:    Pain in feet at night that wakes you up from your sleep:     Blood clot in your veins:    Leg swelling:         Pulmonary    Oxygen at home:    Productive cough:     Wheezing:         Neurologic    Sudden weakness in arms or legs:     Sudden numbness in arms or legs:     Sudden onset of difficulty speaking or slurred speech:    Temporary loss of vision in one eye:     Problems with dizziness:         Gastrointestinal    Blood in stool:     Vomited blood:         Genitourinary    Burning when urinating:     Blood in urine:        Psychiatric    Major depression:         Hematologic    Bleeding problems:    Problems with blood clotting too easily:        Skin    Rashes or ulcers:        Constitutional    Fever or chills:     PHYSICAL EXAM:   Vitals:   05/19/18 1453  BP: 139/86  Pulse: 66  Resp: 20  SpO2: 98%  Weight: 160 lb 3.2 oz (72.7 kg)  Height: 5\' 9"  (1.753 m)    GENERAL: The patient is a well-nourished male, in no acute distress. The vital signs are documented above. CARDIAC: There is a regular rate and rhythm.  VASCULAR: I do not detect carotid bruits. On the right side, he has a palpable femoral, posterior tibial, and dorsalis pedis pulse.  He has biphasic Doppler signals in the right foot  in the dorsalis pedis and posterior tibial positions. On the left side he has a palpable femoral pulse.  He also has a palpable dorsalis pedis pulse.  I cannot palpate a posterior tibial pulse.  He has monophasic Doppler signals in the left foot in the dorsalis pedis and posterior  tibial positions. PULMONARY: There is good air exchange bilaterally without wheezing or rales. ABDOMEN: Soft and non-tender with normal pitched bowel sounds.  MUSCULOSKELETAL: There are no major deformities or cyanosis. NEUROLOGIC: No focal weakness or paresthesias are detected. SKIN: There are no ulcers or rashes noted. PSYCHIATRIC: The patient has a normal affect.  DATA:    He has not had any recent noninvasive studies that I can locate on the computer.  MEDICAL ISSUES:   PERIPHERAL VASCULAR DISEASE: This patient has had previous angioplasty and stenting of the left lower extremity in [facility].  I do not have these records and I do not know where the stent was placed.  However, he has palpable pedal pulses with stable claudication and I would not recommend any further work-up.  I have recommended that we get him on a follow-up protocol and have ordered follow-up ABIs and a left iliac duplex in 6 months.  I suspect that he has a left iliac stent.  I have encouraged him to get on a structured walking program.  We have discussed the importance of tobacco cessation.  I will see him back in 6 months.  He knows to call sooner if he has problems.  Waverly Ferrarihristopher Dickson [facility] [phone]

## 2018-05-19 NOTE — Telephone Encounter (Signed)
Pt's spouse called in to be advised. She would like to know if pt could stop taking his BP medication? If so, how should he wing himself off?   Please advise.

## 2018-05-20 NOTE — Telephone Encounter (Signed)
Called patients wife back and she stated that the reason patient was on BP medication was to pass DOT physical. Patient has since passed physical and BP has been normal, when asking what reading wife stated 138/74 is around what patient gets. I informed patient wife that Dr. Delight Starearwford will likely want patient to stay on BP medication but I will pass the note along to MD.

## 2018-05-20 NOTE — Telephone Encounter (Signed)
Patients wife informed of MD response and stated understanding  

## 2018-05-20 NOTE — Telephone Encounter (Signed)
Up to patient. Likely okay to stop.

## 2018-06-14 ENCOUNTER — Other Ambulatory Visit: Payer: Self-pay

## 2018-06-14 DIAGNOSIS — I739 Peripheral vascular disease, unspecified: Secondary | ICD-10-CM

## 2018-07-05 ENCOUNTER — Other Ambulatory Visit: Payer: Self-pay | Admitting: Internal Medicine

## 2018-07-05 DIAGNOSIS — F17200 Nicotine dependence, unspecified, uncomplicated: Secondary | ICD-10-CM

## 2018-07-05 DIAGNOSIS — I739 Peripheral vascular disease, unspecified: Secondary | ICD-10-CM

## 2018-08-25 ENCOUNTER — Encounter: Payer: Self-pay | Admitting: Internal Medicine

## 2018-08-25 ENCOUNTER — Ambulatory Visit (INDEPENDENT_AMBULATORY_CARE_PROVIDER_SITE_OTHER): Payer: BLUE CROSS/BLUE SHIELD | Admitting: Internal Medicine

## 2018-08-25 VITALS — BP 138/80 | HR 66 | Temp 97.4°F | Ht 69.0 in | Wt 160.0 lb

## 2018-08-25 DIAGNOSIS — R030 Elevated blood-pressure reading, without diagnosis of hypertension: Secondary | ICD-10-CM | POA: Diagnosis not present

## 2018-08-25 DIAGNOSIS — Z23 Encounter for immunization: Secondary | ICD-10-CM | POA: Diagnosis not present

## 2018-08-25 DIAGNOSIS — F17200 Nicotine dependence, unspecified, uncomplicated: Secondary | ICD-10-CM | POA: Diagnosis not present

## 2018-08-25 NOTE — Assessment & Plan Note (Signed)
He is off blood pressure medicine currently and BP at goal. He does have goal of <140/80 but preferred closer to 130/80 due to PAD. He is close to goal today. Will monitor closely.

## 2018-08-25 NOTE — Assessment & Plan Note (Addendum)
Patient does not feel able to make an attempt to quit at this time. Given pneumonia 23 at visit today due to smoker.

## 2018-08-25 NOTE — Progress Notes (Signed)
   Subjective:    Patient ID: Eddie Chan, male    DOB: 15-Jul-1961, 57 y.o.   MRN: [id number]  HPI The patient is a 57 YO man coming in for concerns about his blood pressure. He was given blood pressure medicine which he took to keep BP good for his DOT physical. This went fine and he was able to get approved with that. He then stopped taking the blood pressure medicine. He wanted to make sure that the blood pressure is okay. He is still smoking. Denies headaches or chest pains.   Review of Systems  Constitutional: Negative.   HENT: Negative.   Eyes: Negative.   Respiratory: Negative for cough, chest tightness and shortness of breath.   Cardiovascular: Negative for chest pain, palpitations and leg swelling.  Gastrointestinal: Negative for abdominal distention, abdominal pain, constipation, diarrhea, nausea and vomiting.  Musculoskeletal: Negative.   Skin: Negative.   Neurological: Negative.   Psychiatric/Behavioral: Negative.       Objective:   Physical Exam  Constitutional: He is oriented to person, place, and time. He appears well-developed and well-nourished.  HENT:  Head: Normocephalic and atraumatic.  Eyes: EOM are normal.  Neck: Normal range of motion.  Cardiovascular: Normal rate and regular rhythm.  Pulmonary/Chest: Effort normal and breath sounds normal. No respiratory distress. He has no wheezes. He has no rales.  Abdominal: Soft. Bowel sounds are normal. He exhibits no distension. There is no tenderness. There is no rebound.  Musculoskeletal: He exhibits no edema.  Neurological: He is alert and oriented to person, place, and time. Coordination normal.  Skin: Skin is warm and dry.  Psychiatric: He has a normal mood and affect.   Vitals:   08/25/18 0907  BP: 138/80  Pulse: 66  Temp: (!) 97.4 F (36.3 C)  TempSrc: Oral  SpO2: 96%  Weight: 160 lb (72.6 kg)  Height: 5\' 9"  (1.753 m)      Assessment & Plan:  Pneumonia 23 given at visit

## 2018-08-25 NOTE — Patient Instructions (Signed)
We have given you the pneumonia shot today.   

## 2018-10-05 ENCOUNTER — Other Ambulatory Visit: Payer: Self-pay | Admitting: Internal Medicine

## 2018-10-05 DIAGNOSIS — F17200 Nicotine dependence, unspecified, uncomplicated: Secondary | ICD-10-CM

## 2018-10-05 DIAGNOSIS — I739 Peripheral vascular disease, unspecified: Secondary | ICD-10-CM

## 2018-10-06 ENCOUNTER — Ambulatory Visit (INDEPENDENT_AMBULATORY_CARE_PROVIDER_SITE_OTHER): Payer: BLUE CROSS/BLUE SHIELD

## 2018-10-06 DIAGNOSIS — Z23 Encounter for immunization: Secondary | ICD-10-CM

## 2018-11-17 ENCOUNTER — Ambulatory Visit (INDEPENDENT_AMBULATORY_CARE_PROVIDER_SITE_OTHER): Payer: BLUE CROSS/BLUE SHIELD | Admitting: Vascular Surgery

## 2018-11-17 ENCOUNTER — Ambulatory Visit (INDEPENDENT_AMBULATORY_CARE_PROVIDER_SITE_OTHER)
Admission: RE | Admit: 2018-11-17 | Discharge: 2018-11-17 | Disposition: A | Discharge status: Home / Self Care | Payer: BLUE CROSS/BLUE SHIELD | Source: Ambulatory Visit | Attending: Family | Admitting: Family

## 2018-11-17 ENCOUNTER — Encounter: Payer: Self-pay | Admitting: Vascular Surgery

## 2018-11-17 ENCOUNTER — Ambulatory Visit (HOSPITAL_COMMUNITY)
Admission: RE | Admit: 2018-11-17 | Discharge: 2018-11-17 | Disposition: A | Discharge status: Home / Self Care | Payer: BLUE CROSS/BLUE SHIELD | Source: Ambulatory Visit | Attending: Family | Admitting: Family

## 2018-11-17 ENCOUNTER — Other Ambulatory Visit: Payer: Self-pay

## 2018-11-17 VITALS — BP 131/78 | HR 68 | Temp 97.6°F | Resp 16 | Ht 69.0 in | Wt 162.0 lb

## 2018-11-17 DIAGNOSIS — I739 Peripheral vascular disease, unspecified: Secondary | ICD-10-CM | POA: Diagnosis present

## 2018-11-17 NOTE — Progress Notes (Signed)
Patient name: Eddie Chan MRN: [id number] DOB: 10-Oct-1961 Sex: male  REASON FOR VISIT:   Follow-up of peripheral vascular disease.  HPI:   Eddie Chan is a pleasant 58 y.o. male who I last saw on 05/19/2018.  This patient had undergone previous angioplasty and stenting of a left common iliac artery stenosis at [facility].  We have put him on our protocol and he comes in for 52-month follow-up visit.  Patient does complain of some persistent pain in the left foot which she attributes to nerve related pain from prolonged ischemia prior to his intervention.  I do not get any history of claudication, rest pain, or nonhealing ulcers.  He does continue to smoke 1-1/2 packs/day of cigarettes and has been smoking for as long as he can remember.  There have been no significant changes to his medical history.  He is on Plavix, a statin, and Pletal.  Past Medical History:  Diagnosis Date  . Hyperlipidemia   . Peripheral arterial disease (HCC)     Family History  Problem Relation Age of Onset  . Cancer Mother        lung cancer  . Heart disease Father   . Diabetes Father     SOCIAL HISTORY: Social History   Tobacco Use  . Smoking status: Current Every Day Smoker    Packs/day: 1.50    Years: 40.00    Pack years: 60.00    Types: Cigarettes  . Smokeless tobacco: Never Used  Substance Use Topics  . Alcohol use: Yes    Alcohol/week: 3.0 standard drinks    Types: 3 Cans of beer per week    Comment: social, 2 beers nightly (12 oz)     Allergies  Allergen Reactions  . Penicillins Other (See Comments)    Break out    Current Outpatient Medications  Medication Sig Dispense Refill  . cilostazol (PLETAL) 100 MG tablet TAKE 1 TABLET (100 MG TOTAL) BY MOUTH 2 (TWO) TIMES DAILY. BEFORE OR 2HRS AFTER MEAL 180 tablet 1  . clopidogrel (PLAVIX) 75 MG tablet TAKE 1 TABLET (75 MG TOTAL) BY MOUTH DAILY. 90 tablet 3  . pravastatin (PRAVACHOL) 40 MG tablet Take 1  tablet (40 mg total) by mouth daily. 90 tablet 3   No current facility-administered medications for this visit.     REVIEW OF SYSTEMS:  [X]  denotes positive finding, [ ]  denotes negative finding Cardiac  Comments:  Chest pain or chest pressure:    Shortness of breath upon exertion:    Short of breath when lying flat:    Irregular heart rhythm:        Vascular    Pain in calf, thigh, or hip brought on by ambulation:    Pain in feet at night that wakes you up from your sleep:  x   Blood clot in your veins:    Leg swelling:         Pulmonary    Oxygen at home:    Productive cough:  x   Wheezing:         Neurologic    Sudden weakness in arms or legs:     Sudden numbness in arms or legs:     Sudden onset of difficulty speaking or slurred speech:    Temporary loss of vision in one eye:     Problems with dizziness:         Gastrointestinal    Blood in stool:  Vomited blood:         Genitourinary    Burning when urinating:     Blood in urine:        Psychiatric    Major depression:         Hematologic    Bleeding problems:    Problems with blood clotting too easily:        Skin    Rashes or ulcers:        Constitutional    Fever or chills:     PHYSICAL EXAM:   Vitals:   11/17/18 1022  BP: 131/78  Pulse: 68  Resp: 16  Temp: 97.6 F (36.4 C)  TempSrc: Oral  SpO2: 97%  Weight: 162 lb (73.5 kg)  Height: 5\' 9"  (1.753 m)   GENERAL: The patient is a well-nourished male, in no acute distress. The vital signs are documented above. CARDIAC: There is a regular rate and rhythm.  VASCULAR: I do not detect carotid bruits. On the left side, which is the symptomatic side, he has a palpable femoral pulse.  I cannot palpate a popliteal pulse.  He does have a diminished posterior tibial pulse. On the right side he has a palpable femoral, popliteal, dorsalis pedis, and posterior tibial pulse. He has no significant lower extremity swelling. PULMONARY: There is good air  exchange bilaterally without wheezing or rales. ABDOMEN: Soft and non-tender with normal pitched bowel sounds.  I do not palpate an abdominal aortic aneurysm. MUSCULOSKELETAL: There are no major deformities or cyanosis. NEUROLOGIC: No focal weakness or paresthesias are detected. SKIN: There are no ulcers or rashes noted. PSYCHIATRIC: The patient has a normal affect.  DATA:    ARTERIAL DOPPLER STUDY: I have independently interpreted his arterial Doppler study today.  On the right side he has a triphasic dorsalis pedis and posterior tibial signal.  ABI is 100%.  Toe pressure is 122 mmHg.  On the left side he has a biphasic dorsalis pedis and posterior tibial signal.  ABIs 93%.  Toe pressure is 107 mmHg.  MEDICAL ISSUES:   PERIPHERAL VASCULAR DISEASE: His left common iliac artery stent is widely patent.  He does have infrainguinal arterial occlusive disease on the left but is essentially asymptomatic except for some chronic nerve pain in the foot related to prolonged ischemia prior to his intervention.  I have encouraged him to stay as active as possible.  We have discussed the importance of tobacco cessation.  He does not seem especially interested in trying to quit.  He is on Plavix and a statin.  I have ordered follow-up studies in 1 year and I will see him back at that time.  He knows to call sooner if he has problems.  [name] [facility] [phone]

## 2019-01-13 ENCOUNTER — Telehealth: Payer: Self-pay | Admitting: Internal Medicine

## 2019-01-13 ENCOUNTER — Other Ambulatory Visit: Payer: Self-pay | Admitting: Internal Medicine

## 2019-01-13 DIAGNOSIS — I739 Peripheral vascular disease, unspecified: Secondary | ICD-10-CM

## 2019-01-13 DIAGNOSIS — F17200 Nicotine dependence, unspecified, uncomplicated: Secondary | ICD-10-CM

## 2019-01-13 MED ORDER — CLOPIDOGREL BISULFATE 75 MG PO TABS: ORAL_TABLET | ORAL | 1 refills | Status: DC

## 2019-01-13 MED ORDER — CILOSTAZOL 100 MG PO TABS: 100.0000 mg | ORAL_TABLET | Freq: Two times a day (BID) | ORAL | 1 refills | Status: DC

## 2019-01-13 NOTE — Telephone Encounter (Signed)
Copied from CRM [id number]. Topic: Quick Communication - Rx Refill/Question >> Jan 13, 2019  1:44 PM Jens Som A wrote: Medication: clopidogrel (PLAVIX) 75 MG tablet [[id number]]   Has the patient contacted their pharmacy? Yes  (Agent: If no, request that the patient contact the pharmacy for the refill.) (Agent: If yes, when and what did the pharmacy advise?)  Preferred Pharmacy (with phone number or street name): [id number] [place], [place] - [address] [phone] (Phone) [phone] (Fax)    Agent: Please be advised that RX refills may take up to 3 business days. We ask that you follow-up with your pharmacy.

## 2019-01-13 NOTE — Telephone Encounter (Signed)
Copied from CRM [id number]. Topic: Quick Communication - Rx Refill/Question >> Jan 13, 2019  1:43 PM Jens Som A wrote: Medication: cilostazol (PLETAL) 100 MG tablet [[id number]]   Has the patient contacted their pharmacy? Yes  (Agent: If no, request that the patient contact the pharmacy for the refill.) (Agent: If yes, when and what did the pharmacy advise?)  Preferred Pharmacy (with phone number or street name): [id number] [place], [place] - [address] [phone] (Phone) [phone] (Fax)    Agent: Please be advised that RX refills may take up to 3 business days. We ask that you follow-up with your pharmacy.

## 2019-01-13 NOTE — Telephone Encounter (Signed)
Taken care of in other telephone encounter.

## 2019-01-13 NOTE — Telephone Encounter (Signed)
rx sent

## 2019-04-08 ENCOUNTER — Other Ambulatory Visit: Payer: Self-pay | Admitting: Internal Medicine

## 2019-04-08 DIAGNOSIS — I739 Peripheral vascular disease, unspecified: Secondary | ICD-10-CM

## 2019-06-10 ENCOUNTER — Telehealth: Payer: Self-pay | Admitting: *Deleted

## 2019-06-10 DIAGNOSIS — Z205 Contact with and (suspected) exposure to viral hepatitis: Secondary | ICD-10-CM

## 2019-06-10 NOTE — Telephone Encounter (Signed)
Pt wife called back and advised pt's lab is ordered

## 2019-06-10 NOTE — Telephone Encounter (Signed)
Tried calling patient no answer and voicemail full  

## 2019-06-10 NOTE — Telephone Encounter (Signed)
Patient's wife, Helene Kelp recently diagnosed with hepatitis B and wants to know if/when patient can come be tested.

## 2019-06-10 NOTE — Telephone Encounter (Signed)
Labs placed to be tested for hepatitis.

## 2019-06-10 NOTE — Telephone Encounter (Signed)
Called pt- no answer, unable to leave vm.  

## 2019-06-10 NOTE — Telephone Encounter (Signed)
noted 

## 2019-06-20 ENCOUNTER — Other Ambulatory Visit: Payer: BLUE CROSS/BLUE SHIELD

## 2019-06-20 DIAGNOSIS — Z205 Contact with and (suspected) exposure to viral hepatitis: Secondary | ICD-10-CM

## 2019-06-21 ENCOUNTER — Other Ambulatory Visit: Payer: Self-pay | Admitting: Internal Medicine

## 2019-06-21 DIAGNOSIS — Z205 Contact with and (suspected) exposure to viral hepatitis: Secondary | ICD-10-CM

## 2019-06-21 LAB — HEPATITIS PANEL, ACUTE
Hep A IgM: NONREACTIVE
Hep B C IgM: NONREACTIVE
Hepatitis B Surface Ag: NONREACTIVE
Hepatitis C Ab: NONREACTIVE
SIGNAL TO CUT-OFF: 0.02 (ref <1.00)

## 2019-06-21 LAB — HEPATITIS B SURFACE ANTIBODY,QUALITATIVE: Hep B S Ab: NONREACTIVE

## 2019-06-29 ENCOUNTER — Telehealth: Payer: Self-pay | Admitting: Internal Medicine

## 2019-06-29 NOTE — Telephone Encounter (Signed)
[facility] called in and stated that pt does not understand why he was referred to them ?  She said that that she had to refer him back to [place] so we could tell him why he was referred to them .    Best number for inf [facility]  [phone] Schmid

## 2019-06-29 NOTE — Telephone Encounter (Signed)
Called patient to explain what the referral is for he stated understanding but wants to talk about it on Friday at [facility] appointment

## 2019-07-01 ENCOUNTER — Ambulatory Visit (INDEPENDENT_AMBULATORY_CARE_PROVIDER_SITE_OTHER): Payer: BLUE CROSS/BLUE SHIELD

## 2019-07-01 ENCOUNTER — Other Ambulatory Visit: Payer: Self-pay

## 2019-07-01 DIAGNOSIS — Z23 Encounter for immunization: Secondary | ICD-10-CM | POA: Diagnosis not present

## 2019-07-10 ENCOUNTER — Other Ambulatory Visit: Payer: Self-pay | Admitting: Internal Medicine

## 2019-07-10 DIAGNOSIS — I739 Peripheral vascular disease, unspecified: Secondary | ICD-10-CM

## 2019-07-13 ENCOUNTER — Other Ambulatory Visit: Payer: Self-pay | Admitting: Internal Medicine

## 2019-07-13 DIAGNOSIS — F17200 Nicotine dependence, unspecified, uncomplicated: Secondary | ICD-10-CM

## 2019-07-13 DIAGNOSIS — I739 Peripheral vascular disease, unspecified: Secondary | ICD-10-CM

## 2019-07-25 ENCOUNTER — Ambulatory Visit (INDEPENDENT_AMBULATORY_CARE_PROVIDER_SITE_OTHER): Payer: BLUE CROSS/BLUE SHIELD | Admitting: Family

## 2019-07-25 ENCOUNTER — Other Ambulatory Visit: Payer: Self-pay

## 2019-07-25 ENCOUNTER — Encounter: Payer: Self-pay | Admitting: Family

## 2019-07-25 VITALS — BP 154/79 | HR 73 | Temp 97.9°F

## 2019-07-25 DIAGNOSIS — Z205 Contact with and (suspected) exposure to viral hepatitis: Secondary | ICD-10-CM | POA: Insufficient documentation

## 2019-07-25 NOTE — Assessment & Plan Note (Signed)
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 He is outside the window for any treatment with HbIG at this point. Will retest Hepatitis B status and recommend that he continue with the Hepatitis B vaccination series. We discussed the pathophysiology, transmission, prevention, potential complication and treatments for Hepatitis B including vaccination. Await blood work results and follow up with ID as needed.

## 2019-07-25 NOTE — Patient Instructions (Addendum)
Nice to see you.  We will check your blood work today.  Recommend continuing with the Hepatitis B vaccination.  We will call with the results.  Information about Hepatitis B is below.   Hepatitis B Hepatitis B is a liver infection that is caused by a virus. Hepatitis B is contagious. This means that it can spread from person to person. You can get this disease through:  Blood.  Birth.  Body fluids, like breast milk, semen, vaginal fluids, and spit (saliva). Hepatitis B may last for six months or less (acute) or for longer than six months (chronic). Follow these instructions at home: Medicines   Take over-the-counter and prescription medicines only as told by your doctor.  Take your antiviral medicine as told by your doctor. Do not stop taking the antiviral medicine even if you start to feel better.  Do not take any over-the-counter medicines that have acetaminophen.  Do not take any new medicines unless your doctor says that this is okay. This includes over-the-counter medicines for fever or pain. Activity  Rest when you feel tired.  Do not have sex until your doctor says this is okay.  Ask your doctor when you may go back to school or work. Eating and drinking  Eat a balanced diet. Eat plenty of: ? Fruits and vegetables. ? Whole grains. ? Low-fat (lean) meats or non-meat proteins, such as beans or tofu.  Drink enough fluids to keep your pee (urine) clear or pale yellow.  Avoid alcohol. How is this prevented?  Get the hepatitis B vaccine.  Wash your hands often with soap and water. If you do not have soap and water, use hand sanitizer.  Do not share needles or syringes.  Practice safe sex and use condoms.  Do not handle blood or body fluids without gloves or other protection.  Avoid getting tattoos or piercings in places that are not clean. General instructions  Do not share any of these: ? Toothbrushes. ? Nail clippers. ? Razors.  Wash your hands  often with soap and water. If you do not have soap and water, use hand sanitizer.  Keep all follow-up visits as told by your doctor. This is important. Contact a doctor if:  You get a rash.  Your skin or the whites of your eyes turn yellow (jaundice).  You have a fever. Get help right away if:  You are not able to eat or drink.  You have a fever and you: ? Feel sick to your stomach (nauseous). ? Throw up (vomit).  You feel confused.  You have trouble breathing.  You have swelling in the following areas: ? Your skin. ? Your throat. ? Your mouth. ? Your face. ? Your stomach.  You have a fit of uncontrolled movements (seizures).  You get very sleepy.  You have trouble waking up. Summary  Hepatitis B is a liver infection that is caused by a virus.  The virus can be spread by sharing items like toothbrushes, nail clippers, or razors.  Do not take any new medicines unless your doctor says that this is okay.  Wash your hands often with soap and water. This helps to prevent infection. This information is not intended to replace advice given to you by your health care provider. Make sure you discuss any questions you have with your health care provider. Document Released: 03/01/2009 Document Revised: 09/25/2017 Document Reviewed: 11/18/2016 Elsevier Patient Education  2020 [employer].

## 2019-07-25 NOTE — Progress Notes (Signed)
Subjective:    Patient ID: Eddie Chan, male    DOB: 07/22/1961, 58 y.o.   MRN: [id number]  Chief Complaint  Patient presents with  . Hepatitis B    HPI:  Eddie Chan is a 58 y.o. male with previous medical history of peripheral arterial disease and hyperlipidemia presenting today for evaluation of exposure to Hepatitis B.   Eddie Chan has been previously tested for Hepatitis B in August with a negative Hepatitis B surface antigen, surface antibody, and core IgM. His wife was recently diagnosed with acute Hepatitis B likely acquired from the nursing care facility where she works. He has had no previous history of abdominal pain or liver disease. No current abdominal pain, nausea, jaundice, fatigue or scleral icterus. His occupation is a Naval architecttruck driver. He smokes tobacco on a regular basis and denies recreational or illicit drug use.  He has started taking the Hepatitis B vaccination series.    Allergies  Allergen Reactions  . Penicillins Other (See Comments)    Break out      Outpatient Medications Prior to Visit  Medication Sig Dispense Refill  . cilostazol (PLETAL) 100 MG tablet TAKE 1 TABLET (100 MG TOTAL) BY MOUTH 2 (TWO) TIMES DAILY. 30MINS BEFORE OR 2HRS AFTER MEAL 180 tablet 1  . clopidogrel (PLAVIX) 75 MG tablet TAKE 1 TABLET (75 MG TOTAL) BY MOUTH DAILY. 90 tablet 1  . pravastatin (PRAVACHOL) 40 MG tablet TAKE 1 TABLET (40 MG TOTAL) BY MOUTH DAILY. NEEDS ANNUAL EXAM FOR FURTHER REFILLS 90 tablet 0   No facility-administered medications prior to visit.      Past Medical History:  Diagnosis Date  . Hyperlipidemia   . Peripheral arterial disease [facility](HCC)       Past Surgical History:  Procedure Laterality Date  . Left iliac stenting    . SHOULDER SURGERY        Family History  Problem Relation Age of Onset  . Cancer Mother        lung cancer  . Heart disease Father   . Diabetes Father       Social History   Socioeconomic History  . Marital  status: Married    Spouse name: Not on file  . Number of children: 2  . Years of education: 7  . Highest education level: Not on file  Occupational History  . Occupation: truck Public librariandriver  Social Needs  . Financial resource strain: Not on file  . Food insecurity    Worry: Not on file    Inability: Not on file  . Transportation needs    Medical: Not on file    Non-medical: Not on file  Tobacco Use  . Smoking status: Current Every Day Smoker    Packs/day: 1.50    Years: 40.00    Pack years: 60.00    Types: Cigarettes  . Smokeless tobacco: Never Used  Substance and Sexual Activity  . Alcohol use: Yes    Alcohol/week: 3.0 standard drinks    Types: 3 Cans of beer per week    Comment: social, 2 beers nightly (12 oz)   . Drug use: No  . Sexual activity: Yes    Birth control/protection: None  Lifestyle  . Physical activity    Days per week: Not on file    Minutes per session: Not on file  . Stress: Not on file  Relationships  . Social Musicianconnections    Talks on phone: Not on file    Gets together:  Not on file    Attends religious service: Not on file    Active member of club or organization: Not on file    Attends meetings of clubs or organizations: Not on file    Relationship status: Not on file  . Intimate partner violence    Fear of current or ex partner: Not on file    Emotionally abused: Not on file    Physically abused: Not on file    Forced sexual activity: Not on file  Other Topics Concern  . Not on file  Social History Narrative   He drives a dump truck.  Lives at wife and mother in law in a one story home.    He has two children.   Highest level of education: 7th grade      Review of Systems  Constitutional: Negative for chills, diaphoresis, fatigue and fever.  Respiratory: Negative for cough, chest tightness, shortness of breath and wheezing.   Cardiovascular: Negative for chest pain.  Gastrointestinal: Negative for abdominal distention, abdominal pain,  constipation, diarrhea, nausea and vomiting.  Neurological: Negative for weakness and headaches.  Hematological: Does not bruise/bleed easily.       Objective:    BP (!) 154/79   Pulse 73   Temp 97.9 F (36.6 C)   SpO2 95%  Nursing note and vital signs reviewed.  Physical Exam Constitutional:      General: He is not in acute distress.    Appearance: He is well-developed.  Cardiovascular:     Rate and Rhythm: Normal rate and regular rhythm.     Heart sounds: Normal heart sounds. No murmur. No friction rub. No gallop.   Pulmonary:     Effort: Pulmonary effort is normal. No respiratory distress.     Breath sounds: Normal breath sounds. No wheezing or rales.  Chest:     Chest wall: No tenderness.  Abdominal:     General: Bowel sounds are normal. There is no distension.     Palpations: Abdomen is soft. There is no mass.     Tenderness: There is no abdominal tenderness. There is no guarding or rebound.  Skin:    General: Skin is warm and dry.  Neurological:     Mental Status: He is alert and oriented to person, place, and time.  Psychiatric:        Behavior: Behavior normal.        Thought Content: Thought content normal.        Judgment: Judgment normal.         Assessment & Plan:   Patient Active Problem List   Diagnosis Date Noted  . Exposure to hepatitis B 07/25/2019  . Elevated blood pressure reading in office without diagnosis of hypertension 04/16/2018  . Routine general medical examination at a health care facility 11/20/2017  . Neuropathy 03/09/2017  . PAD (peripheral artery disease) ([place]) 01/28/2017  . DDD (degenerative disc disease), lumbar 01/06/2017  . Tobacco use disorder 01/06/2017  . Chronic radicular pain of lower back 01/06/2017  . Chronic left hip pain 01/06/2017  . Intermittent claudication ([place]) 01/06/2017     Problem List Items Addressed This Visit      Other   Exposure to hepatitis B - Primary    Eddie Chan has tested negative for  Hepatitis B with exposure in his household positive with Acute Hepatitis B. He is outside the window for any treatment with HbIG at this point. Will retest Hepatitis B status and recommend that he continue  with the Hepatitis B vaccination series. We discussed the pathophysiology, transmission, prevention, potential complication and treatments for Hepatitis B including vaccination. Await blood work results and follow up with ID as needed.       Relevant Orders   Hepatitis B core antibody, IgM   Hepatitis B surface antibody,qualitative   Hepatitis B surface antigen       I am having Eddie A. Woelfel "Alinda Money" maintain his clopidogrel, pravastatin, and cilostazol.   Follow-up: Return if symptoms worsen or fail to improve.    Marcos Eke, MSN, FNP-C Nurse Practitioner [facility] Office phone: [phone] Pager: [phone] [facility] Main number: [phone]

## 2019-07-26 LAB — HEPATITIS B SURFACE ANTIBODY,QUALITATIVE: Hep B S Ab: NONREACTIVE

## 2019-07-26 LAB — HEPATITIS B CORE ANTIBODY, IGM: Hep B C IgM: NONREACTIVE

## 2019-07-26 LAB — HEPATITIS B SURFACE ANTIGEN: Hepatitis B Surface Ag: NONREACTIVE

## 2019-07-28 ENCOUNTER — Telehealth: Payer: Self-pay

## 2019-07-28 NOTE — Telephone Encounter (Signed)
-----   Message from [place], [place] sent at 07/28/2019  3:58 PM EDT ----- Please inform Eddie Chan that his Hepatitis B lab work is negative and recommend continuation with Hepatitis B vaccination series. No further follow up with ID.

## 2019-07-28 NOTE — Telephone Encounter (Signed)
Attempted to contact patient to relay lab results per Terri Piedra, FNP. Spoke with patient's wife who was able to take a message. Informed wife that patient is negative for Hep B,and is recommended to continue hep b vaccine series. Patient's wife understands and will have patient follow up with PCP. [place]

## 2019-08-01 ENCOUNTER — Ambulatory Visit: Payer: BLUE CROSS/BLUE SHIELD

## 2019-08-05 ENCOUNTER — Other Ambulatory Visit: Payer: Self-pay

## 2019-08-05 ENCOUNTER — Ambulatory Visit (INDEPENDENT_AMBULATORY_CARE_PROVIDER_SITE_OTHER): Payer: BLUE CROSS/BLUE SHIELD

## 2019-08-05 DIAGNOSIS — Z23 Encounter for immunization: Secondary | ICD-10-CM | POA: Diagnosis not present

## 2019-08-19 ENCOUNTER — Other Ambulatory Visit: Payer: Self-pay

## 2019-08-19 DIAGNOSIS — Z20822 Contact with and (suspected) exposure to covid-19: Secondary | ICD-10-CM

## 2019-08-20 LAB — NOVEL CORONAVIRUS, NAA: SARS-CoV-2, NAA: NOT DETECTED

## 2019-08-26 ENCOUNTER — Other Ambulatory Visit: Payer: Self-pay

## 2019-08-26 DIAGNOSIS — Z20822 Contact with and (suspected) exposure to covid-19: Secondary | ICD-10-CM

## 2019-08-28 LAB — NOVEL CORONAVIRUS, NAA: SARS-CoV-2, NAA: NOT DETECTED

## 2019-08-29 ENCOUNTER — Telehealth: Payer: Self-pay | Admitting: *Deleted

## 2019-08-29 NOTE — Telephone Encounter (Signed)
Reviewed negative [phone] with patient and his wife. No questions asked.

## 2019-10-13 ENCOUNTER — Other Ambulatory Visit: Payer: Self-pay | Admitting: Internal Medicine

## 2019-10-13 DIAGNOSIS — I739 Peripheral vascular disease, unspecified: Secondary | ICD-10-CM

## 2019-11-28 ENCOUNTER — Other Ambulatory Visit: Payer: Self-pay | Admitting: Internal Medicine

## 2020-01-14 ENCOUNTER — Other Ambulatory Visit: Payer: Self-pay | Admitting: Internal Medicine

## 2020-01-14 DIAGNOSIS — F17200 Nicotine dependence, unspecified, uncomplicated: Secondary | ICD-10-CM

## 2020-01-14 DIAGNOSIS — I739 Peripheral vascular disease, unspecified: Secondary | ICD-10-CM

## 2020-01-17 ENCOUNTER — Other Ambulatory Visit: Payer: Self-pay | Admitting: *Deleted

## 2020-01-17 DIAGNOSIS — I739 Peripheral vascular disease, unspecified: Secondary | ICD-10-CM

## 2020-01-18 ENCOUNTER — Ambulatory Visit: Payer: BLUE CROSS/BLUE SHIELD | Admitting: Vascular Surgery

## 2020-01-18 ENCOUNTER — Ambulatory Visit (HOSPITAL_COMMUNITY): Payer: BLUE CROSS/BLUE SHIELD

## 2020-01-22 ENCOUNTER — Emergency Department (HOSPITAL_COMMUNITY): Payer: BLUE CROSS/BLUE SHIELD

## 2020-01-22 ENCOUNTER — Emergency Department (HOSPITAL_COMMUNITY)
Admission: EM | Admit: 2020-01-22 | Discharge: 2020-01-22 | Disposition: A | Discharge status: Home / Self Care | Payer: BLUE CROSS/BLUE SHIELD | Attending: Emergency Medicine | Admitting: Emergency Medicine

## 2020-01-22 ENCOUNTER — Other Ambulatory Visit: Payer: Self-pay

## 2020-01-22 ENCOUNTER — Encounter (HOSPITAL_COMMUNITY): Payer: Self-pay

## 2020-01-22 DIAGNOSIS — Z79899 Other long term (current) drug therapy: Secondary | ICD-10-CM | POA: Diagnosis not present

## 2020-01-22 DIAGNOSIS — S39012A Strain of muscle, fascia and tendon of lower back, initial encounter: Secondary | ICD-10-CM | POA: Diagnosis not present

## 2020-01-22 DIAGNOSIS — S3992XA Unspecified injury of lower back, initial encounter: Secondary | ICD-10-CM | POA: Diagnosis present

## 2020-01-22 DIAGNOSIS — Y929 Unspecified place or not applicable: Secondary | ICD-10-CM | POA: Insufficient documentation

## 2020-01-22 DIAGNOSIS — Y939 Activity, unspecified: Secondary | ICD-10-CM | POA: Diagnosis not present

## 2020-01-22 DIAGNOSIS — F17210 Nicotine dependence, cigarettes, uncomplicated: Secondary | ICD-10-CM | POA: Diagnosis not present

## 2020-01-22 DIAGNOSIS — X500XXA Overexertion from strenuous movement or load, initial encounter: Secondary | ICD-10-CM | POA: Insufficient documentation

## 2020-01-22 DIAGNOSIS — Y999 Unspecified external cause status: Secondary | ICD-10-CM | POA: Diagnosis not present

## 2020-01-22 IMAGING — DX DG LUMBAR SPINE COMPLETE 4+V
5 series · 5 of 5 positions shown · non-contrast
Comparison: [DATE]

CLINICAL DATA: Low back pain for 2 days.

EXAM:
LUMBAR SPINE - COMPLETE 4+ VIEW

[l-spine ap]
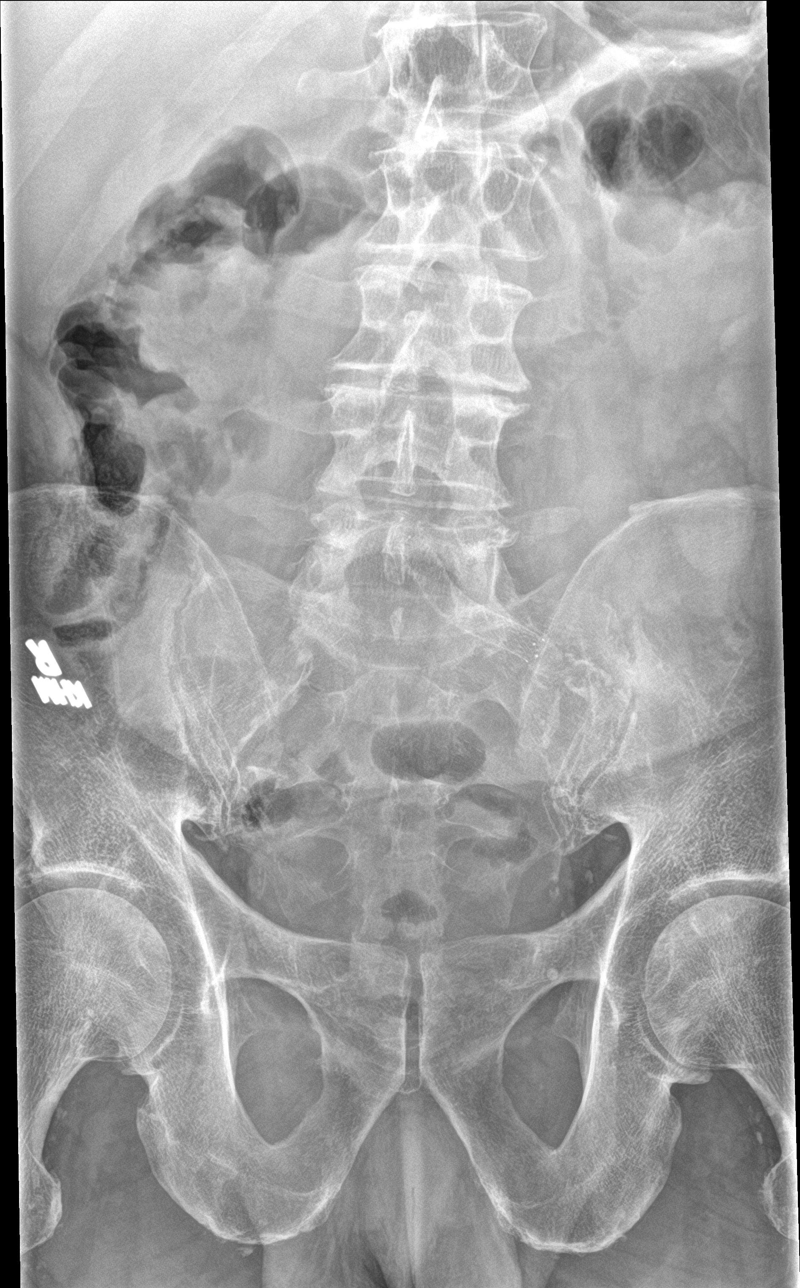

[l-spine obl (1 of 2)]
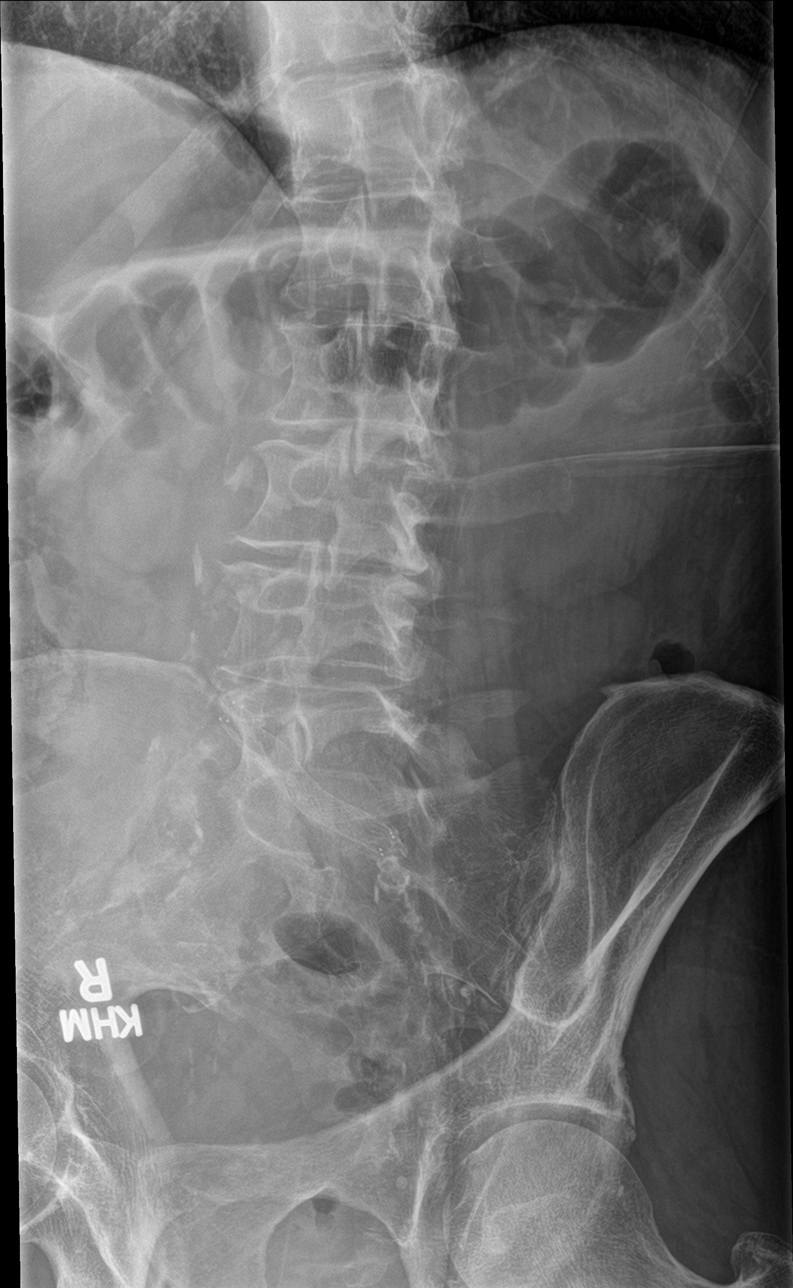

[l-spine obl (2 of 2)]
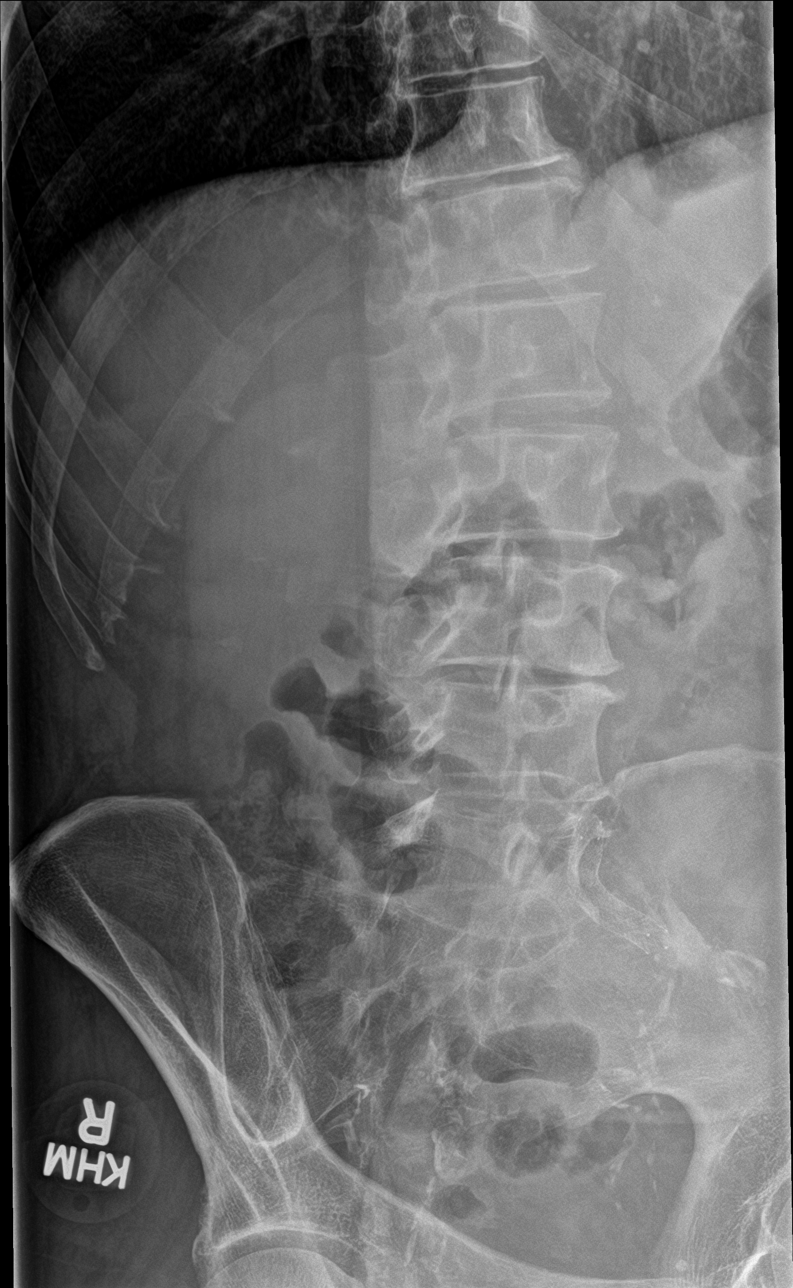

[l-spine lat]
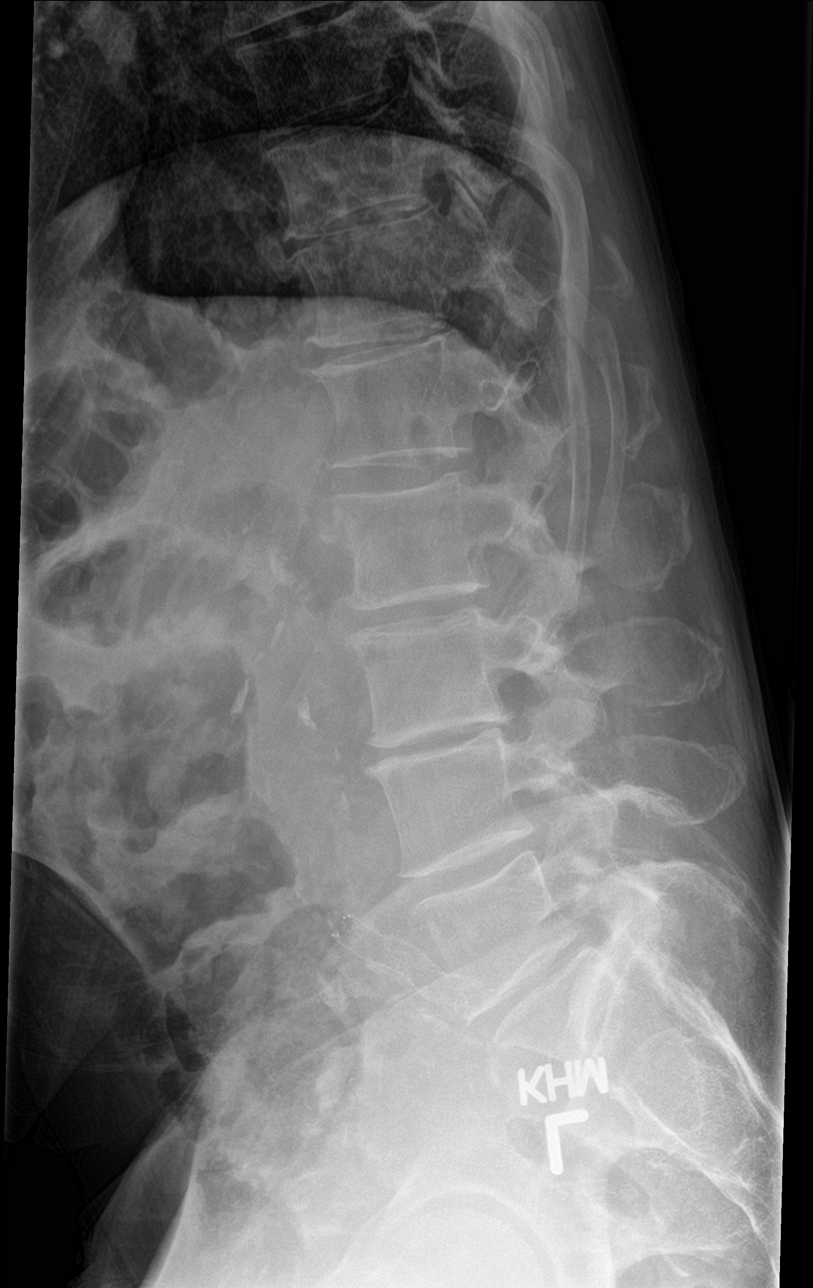

[l-spine spot]
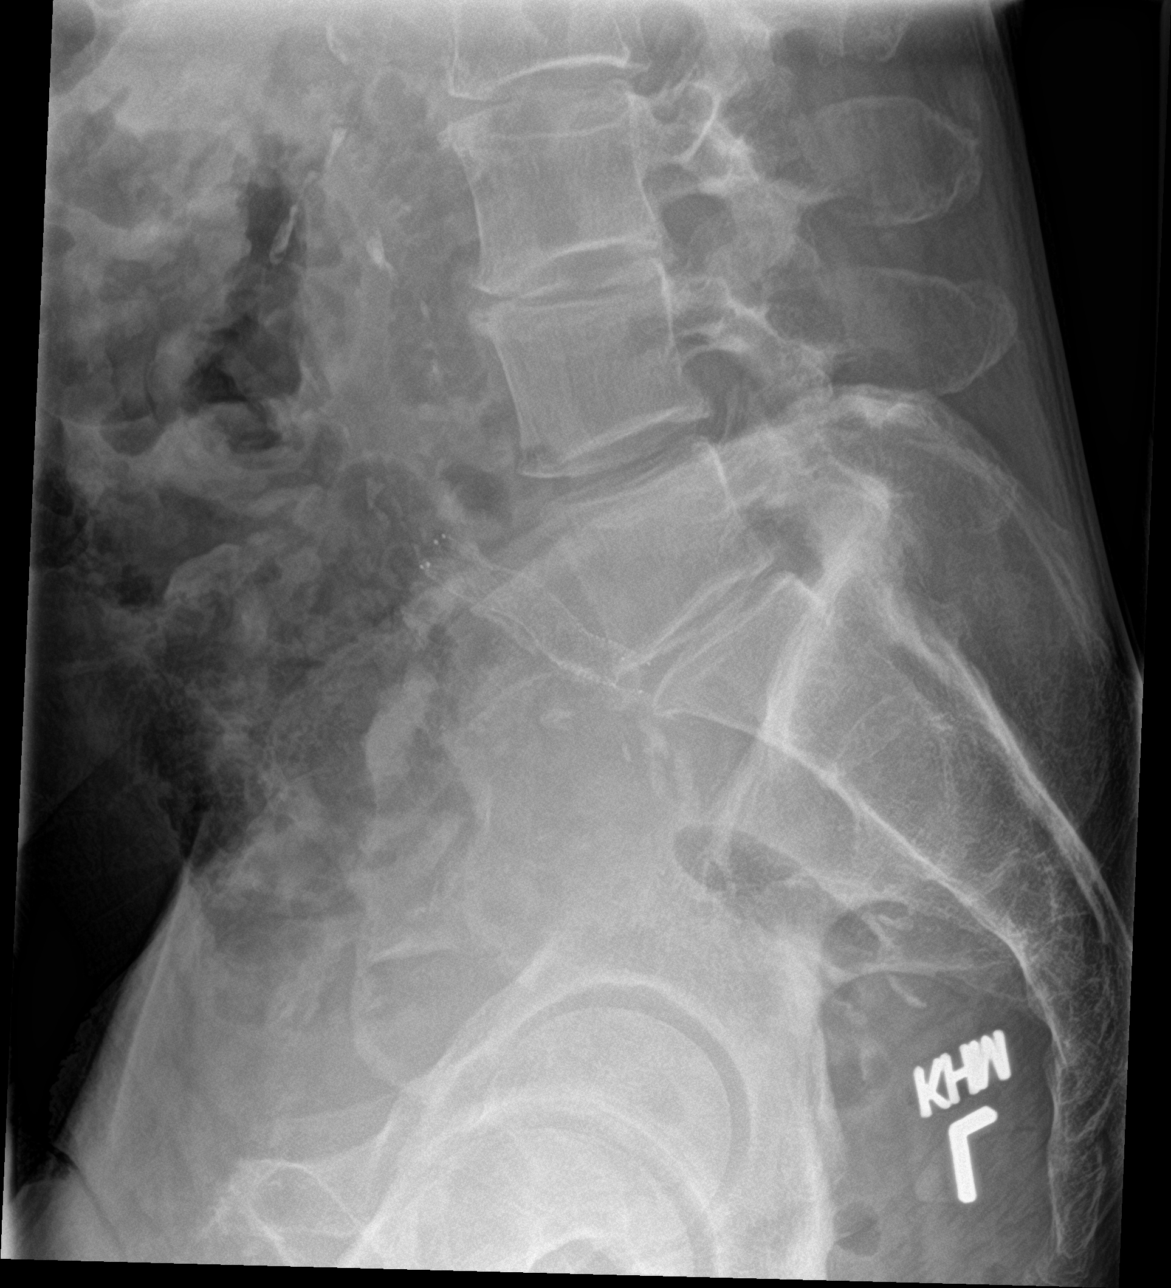

[5 of 5 positions shown; findings below may reference images not displayed]

FINDINGS: No acute fracture or dislocation is identified. Degenerative joint
changes with narrowed joint space and osteophyte formation are
identified throughout lumbar spine.
IMPRESSION: Degenerative joint changes of lumbar spine. Stable compared prior
exam.

## 2020-01-22 MED ORDER — METHOCARBAMOL 500 MG PO TABS: 500.0000 mg | ORAL_TABLET | Freq: Two times a day (BID) | ORAL | 0 refills | Status: DC | PRN

## 2020-01-22 MED ORDER — HYDROCODONE-ACETAMINOPHEN 5-325 MG PO TABS
2.0000 | ORAL_TABLET | Freq: Once | ORAL | Status: AC
  Administered 2020-01-22: 2 via ORAL
  Filled 2020-01-22: qty 2

## 2020-01-22 MED ORDER — DEXAMETHASONE SODIUM PHOSPHATE 10 MG/ML IJ SOLN
10.0000 mg | Freq: Once | INTRAMUSCULAR | Status: DC
  Filled 2020-01-22: qty 1

## 2020-01-22 MED ORDER — DEXAMETHASONE SODIUM PHOSPHATE 10 MG/ML IJ SOLN
10.0000 mg | Freq: Once | INTRAMUSCULAR | Status: AC
  Administered 2020-01-22: 10 mg via INTRAMUSCULAR

## 2020-01-22 MED ORDER — MELOXICAM 7.5 MG PO TABS: 7.5000 mg | ORAL_TABLET | Freq: Two times a day (BID) | ORAL | 0 refills | Status: DC | PRN
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
## 2020-01-22 NOTE — ED Triage Notes (Signed)
Pt reports has been moving wood and c/o pain in lower back x 2 days.  Denies any numbness or tingling in extremities, denies any problems with bowels or bladder.

## 2020-01-22 NOTE — Discharge Instructions (Signed)
Please rest for the next 48 hours, take the medications exactly as prescribed  Naproxen 500 mg by mouth twice a day or alternatively Mobic twice a day  Robaxin, 500 mg twice a day as needed for muscle spasms  Apply ice, rest, seek medical exam for increasing weakness or numbness of the legs.

## 2020-01-23 ENCOUNTER — Ambulatory Visit: Payer: BLUE CROSS/BLUE SHIELD | Admitting: Family

## 2020-02-03 ENCOUNTER — Ambulatory Visit (INDEPENDENT_AMBULATORY_CARE_PROVIDER_SITE_OTHER): Payer: BLUE CROSS/BLUE SHIELD | Admitting: Family

## 2020-02-03 ENCOUNTER — Other Ambulatory Visit: Payer: Self-pay

## 2020-02-03 ENCOUNTER — Encounter: Payer: Self-pay | Admitting: Family

## 2020-02-03 VITALS — BP 150/74 | HR 67 | Temp 98.1°F | Ht 72.0 in | Wt 145.6 lb

## 2020-02-03 DIAGNOSIS — M545 Low back pain, unspecified: Secondary | ICD-10-CM

## 2020-02-03 DIAGNOSIS — R030 Elevated blood-pressure reading, without diagnosis of hypertension: Secondary | ICD-10-CM | POA: Diagnosis not present

## 2020-02-03 MED ORDER — PREDNISONE 20 MG PO TABS: 40.0000 mg | ORAL_TABLET | Freq: Every day | ORAL | 0 refills | Status: DC

## 2020-02-03 NOTE — Patient Instructions (Signed)
Please start checking your blood pressure regularly as discussed; please let Dr. Okey Dupre know if your blood pressure is above 140/90.

## 2020-02-03 NOTE — Progress Notes (Signed)
Eddie Chan is a 59 y.o. male with the following history as recorded in [employer]:  Patient Active Problem List   Diagnosis Date Noted  . Exposure to hepatitis B 07/25/2019  . Elevated blood pressure reading in office without diagnosis of hypertension 04/16/2018  . Routine general medical examination at a health care facility 11/20/2017  . Neuropathy 03/09/2017  . PAD (peripheral artery disease) (HCC) 01/28/2017  . DDD (degenerative disc disease), lumbar 01/06/2017  . Tobacco use disorder 01/06/2017  . Chronic radicular pain of lower back 01/06/2017  . Chronic left hip pain 01/06/2017  . Intermittent claudication (HCC) 01/06/2017    Current Outpatient Medications  Medication Sig Dispense Refill  . cilostazol (PLETAL) 100 MG tablet TAKE 1 TABLET (100 MG TOTAL) BY MOUTH 2 (TWO) TIMES DAILY. BEFORE OR 2HRS AFTER MEAL 180 tablet 1  . clopidogrel (PLAVIX) 75 MG tablet TAKE 1 TABLET BY MOUTH EVERY DAY 90 tablet 1  . pravastatin (PRAVACHOL) 40 MG tablet TAKE 1 TABLET (40 MG TOTAL) BY MOUTH DAILY. NEEDS ANNUAL EXAM FOR FURTHER REFILLS 90 tablet 0  . predniSONE (DELTASONE) 20 MG tablet Take 2 tablets (40 mg total) by mouth daily with breakfast. 10 tablet 0   No current facility-administered medications for this visit.    Allergies: Penicillins  Past Medical History:  Diagnosis Date  . Hyperlipidemia   . Peripheral arterial disease [facility])     Past Surgical History:  Procedure Laterality Date  . Left iliac stenting    . SHOULDER SURGERY      Family History  Problem Relation Age of Onset  . Cancer Mother        lung cancer  . Heart disease Father   . Diabetes Father     Social History   Tobacco Use  . Smoking status: Current Every Day Smoker    Packs/day: 1.50    Years: 40.00    Pack years: 60.00    Types: Cigarettes  . Smokeless tobacco: Never Used  Substance Use Topics  . Alcohol use: Yes    Alcohol/week: 3.0 standard drinks    Types: 3 Cans of beer per week   Comment: social, 2 beers nightly (12 oz)     Subjective:  Seen at ER on 01/22/2020 with low back pain; X-ray did not show neurologic changes; using Mobic bid prn; not currently taking Robaxin due to side effects; does not want to do PT;   Objective:  Vitals:   02/03/20 1136  BP: (!) 150/74  Pulse: 67  Temp: 98.1 F (36.7 C)  TempSrc: Oral  SpO2: 96%  Weight: 145 lb 9.6 oz (66 kg)  Height: 6' (1.829 m)    General: Well developed, well nourished, in no acute distress  Skin : Warm and dry.  Head: Normocephalic and atraumatic  Lungs: Respirations unlabored;  Musculoskeletal: No deformities; no active joint inflammation  Neurologic: Alert and oriented; speech intact; face symmetrical; moves all extremities well; CNII-XII intact without focal deficit   Assessment:  1. Low back pain, unspecified back pain laterality, unspecified chronicity, unspecified whether sciatica present   2. Elevated blood pressure reading in office without diagnosis of hypertension     Plan:  1. Patient does not want to do PT; d/c Mobic; Rx for Prednisone 40 mg qd x 5 days; follow-up worse, no better. 2. Encouraged to start checking his blood pressure regularly- follow-up if consistently above 140/90.  This visit occurred during the SARS-CoV-2 public health emergency.  Safety protocols were in place,  including screening questions prior to the visit, additional usage of staff PPE, and extensive cleaning of exam room while observing appropriate contact time as indicated for disinfecting solutions.      No follow-ups on file.  No orders of the defined types were placed in this encounter.   Requested Prescriptions   Signed Prescriptions Disp Refills  . predniSONE (DELTASONE) 20 MG tablet 10 tablet 0    Sig: Take 2 tablets (40 mg total) by mouth daily with breakfast.

## 2020-03-04 ENCOUNTER — Other Ambulatory Visit: Payer: Self-pay | Admitting: Internal Medicine

## 2020-03-04 DIAGNOSIS — I739 Peripheral vascular disease, unspecified: Secondary | ICD-10-CM

## 2020-03-07 ENCOUNTER — Other Ambulatory Visit: Payer: Self-pay | Admitting: Internal Medicine

## 2020-03-07 DIAGNOSIS — I739 Peripheral vascular disease, unspecified: Secondary | ICD-10-CM

## 2020-03-09 ENCOUNTER — Other Ambulatory Visit: Payer: Self-pay | Admitting: Internal Medicine

## 2020-03-09 DIAGNOSIS — I739 Peripheral vascular disease, unspecified: Secondary | ICD-10-CM

## 2020-03-30 ENCOUNTER — Other Ambulatory Visit: Payer: Self-pay | Admitting: *Deleted

## 2020-03-30 DIAGNOSIS — I739 Peripheral vascular disease, unspecified: Secondary | ICD-10-CM

## 2020-04-04 ENCOUNTER — Encounter: Payer: Self-pay | Admitting: Vascular Surgery

## 2020-04-04 ENCOUNTER — Ambulatory Visit (HOSPITAL_COMMUNITY)
Admission: RE | Admit: 2020-04-04 | Discharge: 2020-04-04 | Disposition: A | Discharge status: Home / Self Care | Payer: BLUE CROSS/BLUE SHIELD | Source: Ambulatory Visit | Attending: Vascular Surgery | Admitting: Vascular Surgery

## 2020-04-04 ENCOUNTER — Ambulatory Visit (INDEPENDENT_AMBULATORY_CARE_PROVIDER_SITE_OTHER): Payer: BLUE CROSS/BLUE SHIELD | Admitting: Vascular Surgery

## 2020-04-04 ENCOUNTER — Other Ambulatory Visit: Payer: Self-pay

## 2020-04-04 ENCOUNTER — Ambulatory Visit (INDEPENDENT_AMBULATORY_CARE_PROVIDER_SITE_OTHER)
Admission: RE | Admit: 2020-04-04 | Discharge: 2020-04-04 | Disposition: A | Discharge status: Home / Self Care | Payer: BLUE CROSS/BLUE SHIELD | Source: Ambulatory Visit | Attending: Vascular Surgery | Admitting: Vascular Surgery

## 2020-04-04 ENCOUNTER — Ambulatory Visit: Payer: BLUE CROSS/BLUE SHIELD | Admitting: Internal Medicine

## 2020-04-04 ENCOUNTER — Other Ambulatory Visit (HOSPITAL_COMMUNITY): Payer: Self-pay | Admitting: Vascular Surgery

## 2020-04-04 VITALS — BP 150/86 | HR 71 | Temp 97.6°F | Resp 20 | Ht 72.0 in | Wt 142.0 lb

## 2020-04-04 DIAGNOSIS — I739 Peripheral vascular disease, unspecified: Secondary | ICD-10-CM

## 2020-04-04 NOTE — Progress Notes (Signed)
REASON FOR VISIT:   Follow-up of peripheral vascular disease.  MEDICAL ISSUES:   PERIPHERAL VASCULAR DISEASE: This patient had a previous left common iliac artery stent in [facility]  He comes in for a yearly follow-up visit.  He has developed a superficial femoral artery stenosis on the left with claudication.  We have again discussed the importance of tobacco cessation.  He does not seem very interested in quitting tobacco.  He is on Plavix and a statin.  I encouraged him to consider taking 81 mg of aspirin daily.  We discussed the importance of getting on a structured walking program we also discussed the importance of nutrition.  I explained that if he felt his symptoms were severe enough that certainly we could consider arteriography as the stenosis in the left superficial femoral artery may be amenable to angioplasty.  However currently he feels like his symptoms are tolerable.  He did not wish to proceed with arteriography.  I will shorten his follow-up to 9 months and I have ordered ABIs and a duplex of his left iliac stent in 9 months.  He knows to call sooner if he has problems.  HPI:   Eddie Chan is a pleasant 59 y.o. male who I last saw on 11/17/2018.  He did undergone a previous left common iliac artery stent that was widely patent.  Of note the left common iliac artery stent was done in [facility].  ABI was 100% on the right and 93% on the left.  I encouraged him to stay as active as possible.  We discussed the importance of tobacco cessation.  He was on Plavix and a statin.  He comes in for a 1 year follow-up visit.  Since I saw him last he has developed left calf claudication which began about 2 months ago.  This came on gradually.  He gets pain in his left calf which is brought on by ambulation and relieved with rest.  This occurs when he is working in the yard.  He has had no symptoms on the right side.  He denies any history of rest pain or  nonhealing ulcers.  He does have a numbness in the left foot which is chronic.  I suspect he had chronic severe peripheral vascular disease prior to his stent.  His risk factors for peripheral vascular disease are heavy tobacco use.  He smokes 1-1/2 packs/day and has been smoking for many years.  He denies any history of diabetes, hypertension, hypercholesterolemia, or family history of premature cardiovascular disease.  Past Medical History:  Diagnosis Date  . Hyperlipidemia   . Peripheral arterial disease (HCC)     Family History  Problem Relation Age of Onset  . Cancer Mother        lung cancer  . Heart disease Father   . Diabetes Father     SOCIAL HISTORY: Social History   Tobacco Use  . Smoking status: Current Every Day Smoker    Packs/day: 1.50    Years: 40.00    Pack years: 60.00    Types: Cigarettes  . Smokeless tobacco: Never Used  Substance Use Topics  . Alcohol use: Yes    Alcohol/week: 3.0 standard drinks    Types: 3 Cans of beer per week    Comment: social, 2 beers nightly (12 oz)     Allergies  Allergen Reactions  . Penicillins Other (See Comments)    Break out    Current Outpatient  Medications  Medication Sig Dispense Refill  . cilostazol (PLETAL) 100 MG tablet TAKE 1 TABLET (100 MG TOTAL) BY MOUTH 2 (TWO) TIMES DAILY. BEFORE OR 2HRS AFTER MEAL 180 tablet 1  . clopidogrel (PLAVIX) 75 MG tablet TAKE 1 TABLET BY MOUTH EVERY DAY 90 tablet 1  . pravastatin (PRAVACHOL) 40 MG tablet TAKE 1 TABLET (40 MG TOTAL) BY MOUTH DAILY. NEEDS ANNUAL EXAM FOR FURTHER REFILLS (Patient not taking: Reported on 04/04/2020) 90 tablet 0   No current facility-administered medications for this visit.    REVIEW OF SYSTEMS:  [X]  denotes positive finding, [ ]  denotes negative finding Cardiac  Comments:  Chest pain or chest pressure:    Shortness of breath upon exertion:    Short of breath when lying flat:    Irregular heart rhythm:        Vascular    Pain in calf,  thigh, or hip brought on by ambulation: x   Pain in feet at night that wakes you up from your sleep:     Blood clot in your veins:    Leg swelling:         Pulmonary    Oxygen at home:    Productive cough:     Wheezing:         Neurologic    Sudden weakness in arms or legs:     Sudden numbness in arms or legs:     Sudden onset of difficulty speaking or slurred speech:    Temporary loss of vision in one eye:     Problems with dizziness:         Gastrointestinal    Blood in stool:     Vomited blood:         Genitourinary    Burning when urinating:     Blood in urine:        Psychiatric    Major depression:         Hematologic    Bleeding problems:    Problems with blood clotting too easily:        Skin    Rashes or ulcers:        Constitutional    Fever or chills:     PHYSICAL EXAM:   Vitals:   04/04/20 0857  BP: (!) 150/86  Pulse: 71  Resp: 20  Temp: 97.6 F (36.4 C)  SpO2: 96%  Weight: 142 lb (64.4 kg)  Height: 6' (1.829 m)    GENERAL: The patient is a well-nourished male, in no acute distress. The vital signs are documented above. CARDIAC: There is a regular rate and rhythm.  VASCULAR: I do not detect carotid bruits. On the right side he has a palpable femoral, popliteal, posterior tibial, and dorsalis pedis pulse. On the left side he has a palpable femoral pulse and diminished popliteal pulse.  I cannot palpate a posterior tibial pulse.  He has a diminished dorsalis pedis pulse. He has no significant lower extremity swelling. PULMONARY: There is good air exchange bilaterally without wheezing or rales. ABDOMEN: Soft and non-tender with normal pitched bowel sounds.  MUSCULOSKELETAL: There are no major deformities or cyanosis. NEUROLOGIC: No focal weakness or paresthesias are detected. SKIN: There are no ulcers or rashes noted. PSYCHIATRIC: The patient has a normal affect.  DATA:    ARTERIAL DOPPLER STUDY: I have independently interpreted his arterial  Doppler study today.  On the right side he has a biphasic dorsalis pedis and posterior tibial signal.  His ABI is  91%.  His toe pressure is 97 mmHg.  On the left side he has a biphasic dorsalis pedis and posterior tibial signal.  His ABIs 75%.   AORTOILIAC DUPLEX: I have independently interpreted his duplex of his left iliac stent.  There is biphasic flow throughout the stent without evidence of significant stenosis in the left iliac stent.  There is mild disease in the left external iliac artery of less than 49%.  LEFT LOWER EXTREMITY ARTERIAL DUPLEX: I have independently interpreted his lower extremity arterial duplex scan.  This shows a greater than 75% stenosis in the distal left superficial femoral artery.  [name] [facility] [phone]

## 2020-04-10 ENCOUNTER — Other Ambulatory Visit: Payer: Self-pay | Admitting: *Deleted

## 2020-04-10 DIAGNOSIS — I739 Peripheral vascular disease, unspecified: Secondary | ICD-10-CM

## 2020-04-13 ENCOUNTER — Ambulatory Visit: Payer: BLUE CROSS/BLUE SHIELD | Admitting: Internal Medicine

## 2020-05-23 ENCOUNTER — Other Ambulatory Visit: Payer: Self-pay | Admitting: Internal Medicine

## 2020-05-25 ENCOUNTER — Ambulatory Visit (INDEPENDENT_AMBULATORY_CARE_PROVIDER_SITE_OTHER): Payer: BLUE CROSS/BLUE SHIELD | Admitting: Internal Medicine

## 2020-05-25 ENCOUNTER — Encounter: Payer: Self-pay | Admitting: Internal Medicine

## 2020-05-25 ENCOUNTER — Other Ambulatory Visit: Payer: Self-pay

## 2020-05-25 VITALS — BP 138/82 | HR 66 | Temp 97.9°F | Ht 72.0 in | Wt 148.0 lb

## 2020-05-25 DIAGNOSIS — Z0000 Encounter for general adult medical examination without abnormal findings: Secondary | ICD-10-CM | POA: Diagnosis not present

## 2020-05-25 DIAGNOSIS — F17200 Nicotine dependence, unspecified, uncomplicated: Secondary | ICD-10-CM

## 2020-05-25 DIAGNOSIS — G629 Polyneuropathy, unspecified: Secondary | ICD-10-CM

## 2020-05-25 DIAGNOSIS — I739 Peripheral vascular disease, unspecified: Secondary | ICD-10-CM

## 2020-05-25 MED ORDER — CLOPIDOGREL BISULFATE 75 MG PO TABS: 75.0000 mg | ORAL_TABLET | Freq: Every day | ORAL | 3 refills | Status: DC

## 2020-05-25 MED ORDER — CILOSTAZOL 100 MG PO TABS: 100.0000 mg | ORAL_TABLET | Freq: Two times a day (BID) | ORAL | 3 refills | Status: DC

## 2020-05-25 MED ORDER — PRAVASTATIN SODIUM 40 MG PO TABS: 40.0000 mg | ORAL_TABLET | Freq: Every day | ORAL | 3 refills | Status: DC

## 2020-05-25 NOTE — Patient Instructions (Signed)

## 2020-05-25 NOTE — Assessment & Plan Note (Signed)
Stable and needs another stent but is delaying that for now.

## 2020-05-25 NOTE — Progress Notes (Signed)
   Subjective:   Patient ID: Eddie Chan, male    DOB: 1961/01/14, 59 y.o.   MRN: [id number]  HPI The patient is a 59 YO man coming in for physical.   PMH, FMH, social history reviewed and updated  Review of Systems  Constitutional: Negative.   HENT: Negative.   Eyes: Negative.   Respiratory: Negative for cough, chest tightness and shortness of breath.   Cardiovascular: Negative for chest pain, palpitations and leg swelling.  Gastrointestinal: Negative for abdominal distention, abdominal pain, constipation, diarrhea, nausea and vomiting.  Musculoskeletal: Positive for arthralgias and myalgias.  Skin: Negative.   Neurological: Positive for numbness.  Psychiatric/Behavioral: Negative.     Objective:  Physical Exam Constitutional:      Appearance: He is well-developed.  HENT:     Head: Normocephalic and atraumatic.  Cardiovascular:     Rate and Rhythm: Normal rate and regular rhythm.  Pulmonary:     Effort: Pulmonary effort is normal. No respiratory distress.     Breath sounds: Normal breath sounds. No wheezing or rales.  Abdominal:     General: Bowel sounds are normal. There is no distension.     Palpations: Abdomen is soft.     Tenderness: There is no abdominal tenderness. There is no rebound.  Musculoskeletal:     Cervical back: Normal range of motion.  Skin:    General: Skin is warm and dry.     Comments: Poor peripheral pulses  Neurological:     Mental Status: He is alert and oriented to person, place, and time.     Coordination: Coordination normal.     Vitals:   05/25/20 1406  BP: (!) 138/82  Pulse: 66  Temp: 97.9 F (36.6 C)  TempSrc: Oral  SpO2: 99%  Weight: 148 lb (67.1 kg)  Height: 6' (1.829 m)    This visit occurred during the SARS-CoV-2 public health emergency.  Safety protocols were in place, including screening questions prior to the visit, additional usage of staff PPE, and extensive cleaning of exam room while observing appropriate contact  time as indicated for disinfecting solutions.   Assessment & Plan:

## 2020-05-25 NOTE — Assessment & Plan Note (Signed)
Flu shot yearly. Covid-19 declines. Shingrix declines. Tetanus up to date. Colonoscopy declines, counseled. Counseled about sun safety and mole surveillance. Counseled about the dangers of distracted driving. Given 10 year screening recommendations.

## 2020-05-25 NOTE — Assessment & Plan Note (Signed)
Seeing vascular. Taking lipitor and plavix, advised to add aspirin 81 mg daily.

## 2020-05-25 NOTE — Assessment & Plan Note (Signed)
Taking plavix and out of pletal. Advised to take aspirin 81 mg daily as well by vascular and reinforced need to stop smoking.

## 2020-05-25 NOTE — Assessment & Plan Note (Signed)
Advised to quit and reminded about risks and harms from smoking.

## 2020-05-26 LAB — CBC
HCT: 44.4 % (ref 38.5–50.0)
Hemoglobin: 15.3 g/dL (ref 13.2–17.1)
MCH: 34.5 pg — ABNORMAL HIGH (ref 27.0–33.0)
MCHC: 34.5 g/dL (ref 32.0–36.0)
MCV: 100.2 fL — ABNORMAL HIGH (ref 80.0–100.0)
MPV: 9.3 fL (ref 7.5–12.5)
Platelets: 186 10*3/uL (ref 140–400)
RBC: 4.43 10*6/uL (ref 4.20–5.80)
RDW: 11.8 % (ref 11.0–15.0)
WBC: 5.6 10*3/uL (ref 3.8–10.8)

## 2020-05-26 LAB — COMPREHENSIVE METABOLIC PANEL
AG Ratio: 1.8 (calc) (ref 1.0–2.5)
ALT: 10 U/L (ref 9–46)
AST: 13 U/L (ref 10–35)
Albumin: 4.3 g/dL (ref 3.6–5.1)
Alkaline phosphatase (APISO): 45 U/L (ref 35–144)
BUN/Creatinine Ratio: 7 (calc) (ref 6–22)
BUN: 6 mg/dL — ABNORMAL LOW (ref 7–25)
CO2: 32 mmol/L (ref 20–32)
Calcium: 9.4 mg/dL (ref 8.6–10.3)
Chloride: 100 mmol/L (ref 98–110)
Creat: 0.83 mg/dL (ref 0.70–1.25)
Globulin: 2.4 g/dL (calc) (ref 1.9–3.7)
Glucose, Bld: 82 mg/dL (ref 65–99)
Potassium: 4 mmol/L (ref 3.5–5.3)
Sodium: 138 mmol/L (ref 135–146)
Total Bilirubin: 0.6 mg/dL (ref 0.2–1.2)
Total Protein: 6.7 g/dL (ref 6.1–8.1)

## 2020-05-26 LAB — LIPID PANEL
Cholesterol: 158 mg/dL (ref <200)
HDL: 55 mg/dL (ref >=40)
LDL Cholesterol (Calc): 86 mg/dL (calc)
Non-HDL Cholesterol (Calc): 103 mg/dL (calc) (ref <130)
Total CHOL/HDL Ratio: 2.9 (calc) (ref <5.0)
Triglycerides: 79 mg/dL (ref <150)

## 2020-09-17 ENCOUNTER — Other Ambulatory Visit: Payer: Self-pay

## 2020-09-27 ENCOUNTER — Other Ambulatory Visit: Payer: Self-pay

## 2020-09-27 DIAGNOSIS — E785 Hyperlipidemia, unspecified: Secondary | ICD-10-CM | POA: Insufficient documentation

## 2020-09-27 DIAGNOSIS — I82409 Acute embolism and thrombosis of unspecified deep veins of unspecified lower extremity: Secondary | ICD-10-CM | POA: Insufficient documentation

## 2020-09-27 DIAGNOSIS — I739 Peripheral vascular disease, unspecified: Secondary | ICD-10-CM

## 2020-10-03 ENCOUNTER — Ambulatory Visit (HOSPITAL_COMMUNITY)
Admission: RE | Admit: 2020-10-03 | Discharge: 2020-10-03 | Disposition: A | Discharge status: Home / Self Care | Payer: BLUE CROSS/BLUE SHIELD | Source: Ambulatory Visit | Attending: Physician Assistant | Admitting: Physician Assistant

## 2020-10-03 ENCOUNTER — Ambulatory Visit (INDEPENDENT_AMBULATORY_CARE_PROVIDER_SITE_OTHER)
Admission: RE | Admit: 2020-10-03 | Discharge: 2020-10-03 | Disposition: A | Discharge status: Home / Self Care | Payer: BLUE CROSS/BLUE SHIELD | Source: Ambulatory Visit | Attending: Physician Assistant | Admitting: Physician Assistant

## 2020-10-03 ENCOUNTER — Other Ambulatory Visit: Payer: Self-pay

## 2020-10-03 ENCOUNTER — Ambulatory Visit (INDEPENDENT_AMBULATORY_CARE_PROVIDER_SITE_OTHER): Payer: BLUE CROSS/BLUE SHIELD | Admitting: Physician Assistant

## 2020-10-03 VITALS — BP 148/86 | HR 98 | Temp 98.6°F | Resp 20 | Ht 72.0 in | Wt 141.5 lb

## 2020-10-03 DIAGNOSIS — I739 Peripheral vascular disease, unspecified: Secondary | ICD-10-CM

## 2020-10-03 NOTE — H&P (View-Only) (Signed)
Office Note     CC:  follow up Requesting Provider:  Myrlene Broker, *  HPI: Eddie Chan is a 59 y.o. (Dec 27, 1960) male who presents for follow up of peripheral artery disease. He has history of left common iliac artery stent that was performed in [place], [place]. He has been followed by Dr. Edilia Bo. He was last seen in June of this year, at which time he was having some left lower extremity claudication. He has known SFA stenosis. Angiography was offered but patient did not want to proceed at that time. He has been compliant with Plavix and Statin. Daily Aspirin 81 mg was encouraged. Smoking cessation was discussed in depth with patient but he was interested in stopping at time of his visit. He was also encouraged to do a walking program. He was given shorter interval follow up.  He presents today for that follow up and to review his non invasive studies. He says his claudication symptoms, especially over the past 1 month, have become worse. He explains that he can barely walk far without pain in his calf. Left leg is worse than right significantly. He gets tightness in right calf with more prolonged ambulation. He does have neuropathy like pain in his legs as well and this often wakes him at night. He explains he cannot tolerate the sheets touching his feet. He does not have any open wounds. He does continue to take Statin and Plavix. He is also taking Pletal  The pt is on a statin for cholesterol management.  The pt is not on a daily aspirin.   Other AC: Plavix The pt is not on medication for hypertension.   The pt is not diabetic.  Tobacco hx: current  Past Medical History:  Diagnosis Date  . Hyperlipidemia   . Peripheral arterial disease [facility])     Past Surgical History:  Procedure Laterality Date  . Left iliac stenting    . SHOULDER SURGERY      Social History   Socioeconomic History  . Marital status: Married    Spouse name: Not on file  . Number of children: 2  .  Years of education: 7  . Highest education level: Not on file  Occupational History  . Occupation: truck Hospital doctor  Tobacco Use  . Smoking status: Current Every Day Smoker    Packs/day: 1.50    Years: 40.00    Pack years: 60.00    Types: Cigarettes  . Smokeless tobacco: Never Used  Vaping Use  . Vaping Use: Never used  Substance and Sexual Activity  . Alcohol use: Yes    Alcohol/week: 3.0 standard drinks    Types: 3 Cans of beer per week    Comment: social, 2 beers nightly (12 oz)   . Drug use: No  . Sexual activity: Yes    Birth control/protection: None  Other Topics Concern  . Not on file  Social History Narrative   He drives a dump truck.  Lives at wife and mother in law in a one story home.    He has two children.   Highest level of education: 7th grade   Social Determinants of Health   Financial Resource Strain:   . Difficulty of Paying Living Expenses: Not on file  Food Insecurity:   . Worried About Programme researcher, broadcasting/film/video in the Last Year: Not on file  . Ran Out of Food in the Last Year: Not on file  Transportation Needs:   . Lack of  Transportation (Medical): Not on file  . Lack of Transportation (Non-Medical): Not on file  Physical Activity:   . Days of Exercise per Week: Not on file  . Minutes of Exercise per Session: Not on file  Stress:   . Feeling of Stress : Not on file  Social Connections:   . Frequency of Communication with Friends and Family: Not on file  . Frequency of Social Gatherings with Friends and Family: Not on file  . Attends Religious Services: Not on file  . Active Member of Clubs or Organizations: Not on file  . Attends Banker Meetings: Not on file  . Marital Status: Not on file  Intimate Partner Violence:   . Fear of Current or Ex-Partner: Not on file  . Emotionally Abused: Not on file  . Physically Abused: Not on file  . Sexually Abused: Not on file    Family History  Problem Relation Age of Onset  . Cancer Mother         lung cancer  . Heart disease Father   . Diabetes Father     Current Outpatient Medications  Medication Sig Dispense Refill  . cilostazol (PLETAL) 100 MG tablet Take 1 tablet (100 mg total) by mouth 2 (two) times daily. before or 2hrs after meal 180 tablet 3  . clopidogrel (PLAVIX) 75 MG tablet Take 1 tablet (75 mg total) by mouth daily. 90 tablet 3  . pravastatin (PRAVACHOL) 40 MG tablet Take 1 tablet (40 mg total) by mouth daily. 90 tablet 3   No current facility-administered medications for this visit.    Allergies  Allergen Reactions  . Penicillins Other (See Comments)    Break out     REVIEW OF SYSTEMS:   [X]  denotes positive finding, [ ]  denotes negative finding Cardiac  Comments:  Chest pain or chest pressure:    Shortness of breath upon exertion:    Short of breath when lying flat:    Irregular heart rhythm:        Vascular    Pain in calf, thigh, or hip brought on by ambulation:    Pain in feet at night that wakes you up from your sleep:     Blood clot in your veins:    Leg swelling:         Pulmonary    Oxygen at home:    Productive cough:     Wheezing:         Neurologic    Sudden weakness in arms or legs:     Sudden numbness in arms or legs:     Sudden onset of difficulty speaking or slurred speech:    Temporary loss of vision in one eye:     Problems with dizziness:         Gastrointestinal    Blood in stool:     Vomited blood:         Genitourinary    Burning when urinating:     Blood in urine:        Psychiatric    Major depression:         Hematologic    Bleeding problems:    Problems with blood clotting too easily:        Skin    Rashes or ulcers:        Constitutional    Fever or chills:      PHYSICAL EXAMINATION:  Vitals:   10/03/20 0853  BP: (!) 148/86  Pulse: 98  Resp: 20  Temp: 98.6 F (37 C)  TempSrc: Temporal  SpO2: 95%  Weight: 141 lb 8 oz (64.2 kg)  Height: 6' (1.829 m)    General:  WDWN in NAD; vital  signs documented above Gait: Normal HENT: WNL, normocephalic Pulmonary: normal non-labored breathing , without Rales, rhonchi,  wheezing Cardiac: regular HR, without  Murmurs without carotid bruit Abdomen: soft, NT, no masses Vascular Exam/Pulses:  Right Left  Radial 2+ (normal) 2+ (normal)  Femoral 1+ (weak) 2+ (normal)  Popliteal 2+ (normal) Not palpable  DP 2+ (normal) Not palpable  PT Not palpable Not palpable   Extremities: without ischemic changes, without Gangrene , without cellulitis; without open wounds;  Musculoskeletal: no muscle wasting or atrophy  Neurologic: A&O X 3;  No focal weakness or paresthesias are detected Psychiatric:  The pt has Normal affect.   Non-Invasive Vascular Imaging:   10/03/20 +-------+-----------+-----------+------------+------------+  ABI/TBIToday's ABIToday's TBIPrevious ABIPrevious TBI  +-------+-----------+-----------+------------+------------+  Right 0.94    0.67       0.91     0.60      +-------+-----------+-----------+------------+------------+  Left  0.55    0.00       0.75      0.00     +-------+-----------+-----------+------------+------------+     Abdominal Aorta Findings:  +-------------+-------+----------+----------+---------+--------+--------+  Location   AP (cm)Trans (cm)PSV (cm/s)Waveform ThrombusComments  +-------------+-------+----------+----------+---------+--------+--------+  Proximal   2.01  2.36   51                   +-------------+-------+----------+----------+---------+--------+--------+  RT CIA Distal         115    triphasic          +-------------+-------+----------+----------+---------+--------+--------+   Limited imaging of left leg shows distal occlusion of superficial femoral artery.    Left Stent(s):  +---------------+--------+--------+--------+--------+  common iliac  PSV  cm/sStenosisWaveformComments  +---------------+--------+--------+--------+--------+  Prox to Stent 108                 +---------------+--------+--------+--------+--------+  Proximal Stent 114                 +---------------+--------+--------+--------+--------+  Mid Stent   106                 +---------------+--------+--------+--------+--------+  Distal Stent  112                 +---------------+--------+--------+--------+--------+  Distal to Stent86                 +---------------+--------+--------+--------+--------+   Patent stent with no evidence of restenosis.   ASSESSMENT/PLAN:: 59 y.o. male here for follow up for peripheral artery disease. He has had progression of his claudication symptoms from his last visit. He had high grade stenosis in the left SFA on prior duplex and this area is now occluded on duplex today. His left iliac stent is otherwise patent. His symptoms are now lifestyle limiting and he is agreeable now to proceed with Angiography with possible intervention. Risks/ benefits were discussed with the patient and his questions were answered - I discussed again the importance of smoking cessation, walking program and continuing his Statin and Plavix -I will arrange for him to have Aortogram/ Arteriogram with Dr. Edilia Bo at his next earliest availability   Graceann Congress, [place] [facility] [phone]  Clinic MD:  Dr. Randie Heinz

## 2020-10-03 NOTE — Progress Notes (Signed)
Office Note     CC:  follow up Requesting Provider:  Myrlene Broker, *  HPI: Eddie Chan is a 59 y.o. (Dec 27, 1960) male who presents for follow up of peripheral artery disease. He has history of left common iliac artery stent that was performed in [place], [place]. He has been followed by Dr. Edilia Bo. He was last seen in June of this year, at which time he was having some left lower extremity claudication. He has known SFA stenosis. Angiography was offered but patient did not want to proceed at that time. He has been compliant with Plavix and Statin. Daily Aspirin 81 mg was encouraged. Smoking cessation was discussed in depth with patient but he was interested in stopping at time of his visit. He was also encouraged to do a walking program. He was given shorter interval follow up.  He presents today for that follow up and to review his non invasive studies. He says his claudication symptoms, especially over the past 1 month, have become worse. He explains that he can barely walk far without pain in his calf. Left leg is worse than right significantly. He gets tightness in right calf with more prolonged ambulation. He does have neuropathy like pain in his legs as well and this often wakes him at night. He explains he cannot tolerate the sheets touching his feet. He does not have any open wounds. He does continue to take Statin and Plavix. He is also taking Pletal  The pt is on a statin for cholesterol management.  The pt is not on a daily aspirin.   Other AC: Plavix The pt is not on medication for hypertension.   The pt is not diabetic.  Tobacco hx: current  Past Medical History:  Diagnosis Date  . Hyperlipidemia   . Peripheral arterial disease [facility])     Past Surgical History:  Procedure Laterality Date  . Left iliac stenting    . SHOULDER SURGERY      Social History   Socioeconomic History  . Marital status: Married    Spouse name: Not on file  . Number of children: 2  .  Years of education: 7  . Highest education level: Not on file  Occupational History  . Occupation: truck Hospital doctor  Tobacco Use  . Smoking status: Current Every Day Smoker    Packs/day: 1.50    Years: 40.00    Pack years: 60.00    Types: Cigarettes  . Smokeless tobacco: Never Used  Vaping Use  . Vaping Use: Never used  Substance and Sexual Activity  . Alcohol use: Yes    Alcohol/week: 3.0 standard drinks    Types: 3 Cans of beer per week    Comment: social, 2 beers nightly (12 oz)   . Drug use: No  . Sexual activity: Yes    Birth control/protection: None  Other Topics Concern  . Not on file  Social History Narrative   He drives a dump truck.  Lives at wife and mother in law in a one story home.    He has two children.   Highest level of education: 7th grade   Social Determinants of Health   Financial Resource Strain:   . Difficulty of Paying Living Expenses: Not on file  Food Insecurity:   . Worried About Programme researcher, broadcasting/film/video in the Last Year: Not on file  . Ran Out of Food in the Last Year: Not on file  Transportation Needs:   . Lack of  Transportation (Medical): Not on file  . Lack of Transportation (Non-Medical): Not on file  Physical Activity:   . Days of Exercise per Week: Not on file  . Minutes of Exercise per Session: Not on file  Stress:   . Feeling of Stress : Not on file  Social Connections:   . Frequency of Communication with Friends and Family: Not on file  . Frequency of Social Gatherings with Friends and Family: Not on file  . Attends Religious Services: Not on file  . Active Member of Clubs or Organizations: Not on file  . Attends Banker Meetings: Not on file  . Marital Status: Not on file  Intimate Partner Violence:   . Fear of Current or Ex-Partner: Not on file  . Emotionally Abused: Not on file  . Physically Abused: Not on file  . Sexually Abused: Not on file    Family History  Problem Relation Age of Onset  . Cancer Mother         lung cancer  . Heart disease Father   . Diabetes Father     Current Outpatient Medications  Medication Sig Dispense Refill  . cilostazol (PLETAL) 100 MG tablet Take 1 tablet (100 mg total) by mouth 2 (two) times daily. before or 2hrs after meal 180 tablet 3  . clopidogrel (PLAVIX) 75 MG tablet Take 1 tablet (75 mg total) by mouth daily. 90 tablet 3  . pravastatin (PRAVACHOL) 40 MG tablet Take 1 tablet (40 mg total) by mouth daily. 90 tablet 3   No current facility-administered medications for this visit.    Allergies  Allergen Reactions  . Penicillins Other (See Comments)    Break out     REVIEW OF SYSTEMS:   [X]  denotes positive finding, [ ]  denotes negative finding Cardiac  Comments:  Chest pain or chest pressure:    Shortness of breath upon exertion:    Short of breath when lying flat:    Irregular heart rhythm:        Vascular    Pain in calf, thigh, or hip brought on by ambulation:    Pain in feet at night that wakes you up from your sleep:     Blood clot in your veins:    Leg swelling:         Pulmonary    Oxygen at home:    Productive cough:     Wheezing:         Neurologic    Sudden weakness in arms or legs:     Sudden numbness in arms or legs:     Sudden onset of difficulty speaking or slurred speech:    Temporary loss of vision in one eye:     Problems with dizziness:         Gastrointestinal    Blood in stool:     Vomited blood:         Genitourinary    Burning when urinating:     Blood in urine:        Psychiatric    Major depression:         Hematologic    Bleeding problems:    Problems with blood clotting too easily:        Skin    Rashes or ulcers:        Constitutional    Fever or chills:      PHYSICAL EXAMINATION:  Vitals:   10/03/20 0853  BP: (!) 148/86  Pulse: 98  Resp: 20  Temp: 98.6 F (37 C)  TempSrc: Temporal  SpO2: 95%  Weight: 141 lb 8 oz (64.2 kg)  Height: 6' (1.829 m)    General:  WDWN in NAD; vital  signs documented above Gait: Normal HENT: WNL, normocephalic Pulmonary: normal non-labored breathing , without Rales, rhonchi,  wheezing Cardiac: regular HR, without  Murmurs without carotid bruit Abdomen: soft, NT, no masses Vascular Exam/Pulses:  Right Left  Radial 2+ (normal) 2+ (normal)  Femoral 1+ (weak) 2+ (normal)  Popliteal 2+ (normal) Not palpable  DP 2+ (normal) Not palpable  PT Not palpable Not palpable   Extremities: without ischemic changes, without Gangrene , without cellulitis; without open wounds;  Musculoskeletal: no muscle wasting or atrophy  Neurologic: A&O X 3;  No focal weakness or paresthesias are detected Psychiatric:  The pt has Normal affect.   Non-Invasive Vascular Imaging:   10/03/20 +-------+-----------+-----------+------------+------------+  ABI/TBIToday's ABIToday's TBIPrevious ABIPrevious TBI  +-------+-----------+-----------+------------+------------+  Right 0.94    0.67       0.91     0.60      +-------+-----------+-----------+------------+------------+  Left  0.55    0.00       0.75      0.00     +-------+-----------+-----------+------------+------------+     Abdominal Aorta Findings:  +-------------+-------+----------+----------+---------+--------+--------+  Location   AP (cm)Trans (cm)PSV (cm/s)Waveform ThrombusComments  +-------------+-------+----------+----------+---------+--------+--------+  Proximal   2.01  2.36   51                   +-------------+-------+----------+----------+---------+--------+--------+  RT CIA Distal         115    triphasic          +-------------+-------+----------+----------+---------+--------+--------+   Limited imaging of left leg shows distal occlusion of superficial femoral artery.    Left Stent(s):  +---------------+--------+--------+--------+--------+  common iliac  PSV  cm/sStenosisWaveformComments  +---------------+--------+--------+--------+--------+  Prox to Stent 108                 +---------------+--------+--------+--------+--------+  Proximal Stent 114                 +---------------+--------+--------+--------+--------+  Mid Stent   106                 +---------------+--------+--------+--------+--------+  Distal Stent  112                 +---------------+--------+--------+--------+--------+  Distal to Stent86                 +---------------+--------+--------+--------+--------+   Patent stent with no evidence of restenosis.   ASSESSMENT/PLAN:: 59 y.o. male here for follow up for peripheral artery disease. He has had progression of his claudication symptoms from his last visit. He had high grade stenosis in the left SFA on prior duplex and this area is now occluded on duplex today. His left iliac stent is otherwise patent. His symptoms are now lifestyle limiting and he is agreeable now to proceed with Angiography with possible intervention. Risks/ benefits were discussed with the patient and his questions were answered - I discussed again the importance of smoking cessation, walking program and continuing his Statin and Plavix -I will arrange for him to have Aortogram/ Arteriogram with Dr. Edilia Bo at his next earliest availability   Graceann Congress, [place] [facility] [phone]  Clinic MD:  Dr. Randie Heinz

## 2020-10-11 ENCOUNTER — Other Ambulatory Visit (HOSPITAL_COMMUNITY)
Admission: RE | Admit: 2020-10-11 | Discharge: 2020-10-11 | Disposition: A | Discharge status: Home / Self Care | Payer: BLUE CROSS/BLUE SHIELD | Source: Ambulatory Visit | Attending: Vascular Surgery | Admitting: Vascular Surgery

## 2020-10-11 DIAGNOSIS — Z01812 Encounter for preprocedural laboratory examination: Secondary | ICD-10-CM | POA: Insufficient documentation

## 2020-10-11 DIAGNOSIS — I70222 Atherosclerosis of native arteries of extremities with rest pain, left leg: Secondary | ICD-10-CM | POA: Diagnosis present

## 2020-10-11 DIAGNOSIS — Z7902 Long term (current) use of antithrombotics/antiplatelets: Secondary | ICD-10-CM | POA: Diagnosis not present

## 2020-10-11 DIAGNOSIS — Z20822 Contact with and (suspected) exposure to covid-19: Secondary | ICD-10-CM | POA: Insufficient documentation

## 2020-10-11 DIAGNOSIS — Z79899 Other long term (current) drug therapy: Secondary | ICD-10-CM | POA: Diagnosis not present

## 2020-10-11 DIAGNOSIS — F17210 Nicotine dependence, cigarettes, uncomplicated: Secondary | ICD-10-CM | POA: Diagnosis not present

## 2020-10-11 DIAGNOSIS — Z880 Allergy status to penicillin: Secondary | ICD-10-CM | POA: Diagnosis not present

## 2020-10-11 LAB — SARS CORONAVIRUS 2 (TAT 6-24 HRS): SARS Coronavirus 2: NEGATIVE

## 2020-10-12 ENCOUNTER — Other Ambulatory Visit: Payer: Self-pay

## 2020-10-12 ENCOUNTER — Ambulatory Visit (HOSPITAL_COMMUNITY)
Admission: RE | Admit: 2020-10-12 | Discharge: 2020-10-12 | Disposition: A | Discharge status: Home / Self Care | Payer: BLUE CROSS/BLUE SHIELD | Attending: Vascular Surgery | Admitting: Vascular Surgery

## 2020-10-12 ENCOUNTER — Encounter (HOSPITAL_COMMUNITY)
Admission: RE | Disposition: A | Discharge status: Home / Self Care | Payer: Self-pay | Source: Home / Self Care | Attending: Vascular Surgery

## 2020-10-12 DIAGNOSIS — Z79899 Other long term (current) drug therapy: Secondary | ICD-10-CM | POA: Insufficient documentation

## 2020-10-12 DIAGNOSIS — F17210 Nicotine dependence, cigarettes, uncomplicated: Secondary | ICD-10-CM | POA: Insufficient documentation

## 2020-10-12 DIAGNOSIS — Z7902 Long term (current) use of antithrombotics/antiplatelets: Secondary | ICD-10-CM | POA: Insufficient documentation

## 2020-10-12 DIAGNOSIS — Z20822 Contact with and (suspected) exposure to covid-19: Secondary | ICD-10-CM | POA: Insufficient documentation

## 2020-10-12 DIAGNOSIS — Z880 Allergy status to penicillin: Secondary | ICD-10-CM | POA: Insufficient documentation

## 2020-10-12 DIAGNOSIS — I70222 Atherosclerosis of native arteries of extremities with rest pain, left leg: Secondary | ICD-10-CM | POA: Insufficient documentation

## 2020-10-12 HISTORY — PX: PERIPHERAL VASCULAR INTERVENTION: CATH118257

## 2020-10-12 HISTORY — PX: ABDOMINAL AORTOGRAM W/LOWER EXTREMITY: CATH118223

## 2020-10-12 LAB — POCT I-STAT, CHEM 8
BUN: 7 mg/dL (ref 6–20)
Calcium, Ion: 1.24 mmol/L (ref 1.15–1.40)
Chloride: 99 mmol/L (ref 98–111)
Creatinine, Ser: 0.6 mg/dL — ABNORMAL LOW (ref 0.61–1.24)
Glucose, Bld: 109 mg/dL — ABNORMAL HIGH (ref 70–99)
HCT: 45 % (ref 39.0–52.0)
Hemoglobin: 15.3 g/dL (ref 13.0–17.0)
Potassium: 3.9 mmol/L (ref 3.5–5.1)
Sodium: 140 mmol/L (ref 135–145)
TCO2: 26 mmol/L (ref 22–32)

## 2020-10-12 LAB — POCT ACTIVATED CLOTTING TIME
Activated Clotting Time: 237 seconds
Activated Clotting Time: 267 seconds
Activated Clotting Time: 196 seconds
Activated Clotting Time: 172 seconds

## 2020-10-12 SURGERY — ABDOMINAL AORTOGRAM W/LOWER EXTREMITY
Anesthesia: LOCAL | Laterality: Left

## 2020-10-12 MED ORDER — ONDANSETRON HCL 4 MG/2ML IJ SOLN: 4.0000 mg | Freq: Four times a day (QID) | INTRAMUSCULAR | Status: DC | PRN

## 2020-10-12 MED ORDER — HYDRALAZINE HCL 20 MG/ML IJ SOLN
INTRAMUSCULAR | Status: AC
  Filled 2020-10-12: qty 1

## 2020-10-12 MED ORDER — HEPARIN SODIUM (PORCINE) 1000 UNIT/ML IJ SOLN
INTRAMUSCULAR | Status: AC
  Filled 2020-10-12: qty 1

## 2020-10-12 MED ORDER — SODIUM CHLORIDE 0.9 % IV SOLN: 250.0000 mL | INTRAVENOUS | Status: DC | PRN

## 2020-10-12 MED ORDER — FENTANYL CITRATE (PF) 100 MCG/2ML IJ SOLN
INTRAMUSCULAR | Status: AC
  Filled 2020-10-12: qty 2

## 2020-10-12 MED ORDER — SODIUM CHLORIDE 0.9% FLUSH: 3.0000 mL | INTRAVENOUS | Status: DC | PRN

## 2020-10-12 MED ORDER — MIDAZOLAM HCL 2 MG/2ML IJ SOLN
INTRAMUSCULAR | Status: DC | PRN
  Administered 2020-10-12: 1 mg via INTRAVENOUS

## 2020-10-12 MED ORDER — HEPARIN SODIUM (PORCINE) 1000 UNIT/ML IJ SOLN
INTRAMUSCULAR | Status: DC | PRN
  Administered 2020-10-12: 7000 [IU] via INTRAVENOUS

## 2020-10-12 MED ORDER — SODIUM CHLORIDE 0.9% FLUSH: 3.0000 mL | Freq: Two times a day (BID) | INTRAVENOUS | Status: DC

## 2020-10-12 MED ORDER — FENTANYL CITRATE (PF) 100 MCG/2ML IJ SOLN
INTRAMUSCULAR | Status: DC | PRN
  Administered 2020-10-12: 50 ug via INTRAVENOUS

## 2020-10-12 MED ORDER — CLOPIDOGREL BISULFATE 300 MG PO TABS
ORAL_TABLET | ORAL | Status: DC | PRN
  Administered 2020-10-12: 300 mg via ORAL

## 2020-10-12 MED ORDER — LIDOCAINE HCL (PF) 1 % IJ SOLN
INTRAMUSCULAR | Status: AC
  Filled 2020-10-12: qty 30

## 2020-10-12 MED ORDER — LABETALOL HCL 5 MG/ML IV SOLN: 10.0000 mg | INTRAVENOUS | Status: DC | PRN

## 2020-10-12 MED ORDER — ACETAMINOPHEN 325 MG PO TABS: 650.0000 mg | ORAL_TABLET | ORAL | Status: DC | PRN

## 2020-10-12 MED ORDER — SODIUM CHLORIDE 0.9 % IV SOLN: INTRAVENOUS | Status: DC

## 2020-10-12 MED ORDER — MIDAZOLAM HCL 2 MG/2ML IJ SOLN
INTRAMUSCULAR | Status: AC
  Filled 2020-10-12: qty 2

## 2020-10-12 MED ORDER — LIDOCAINE HCL (PF) 1 % IJ SOLN
INTRAMUSCULAR | Status: DC | PRN
  Administered 2020-10-12: 15 mL via INTRADERMAL

## 2020-10-12 MED ORDER — HYDRALAZINE HCL 20 MG/ML IJ SOLN
5.0000 mg | INTRAMUSCULAR | Status: AC | PRN
  Administered 2020-10-12 (×2): 5 mg via INTRAVENOUS

## 2020-10-12 MED ORDER — CLOPIDOGREL BISULFATE 300 MG PO TABS
ORAL_TABLET | ORAL | Status: AC
  Filled 2020-10-12: qty 1

## 2020-10-12 MED ORDER — HEPARIN (PORCINE) IN NACL 1000-0.9 UT/500ML-% IV SOLN
INTRAVENOUS | Status: DC | PRN
  Administered 2020-10-12 (×2): 500 mL

## 2020-10-12 MED ORDER — IODIXANOL 320 MG/ML IV SOLN
INTRAVENOUS | Status: DC | PRN
  Administered 2020-10-12: 14:00:00 160 mL via INTRA_ARTERIAL

## 2020-10-12 MED ORDER — SODIUM CHLORIDE 0.9 % WEIGHT BASED INFUSION: 1.0000 mL/kg/h | INTRAVENOUS | Status: DC

## 2020-10-12 MED ORDER — ASPIRIN EC 81 MG PO TBEC: 81.0000 mg | DELAYED_RELEASE_TABLET | Freq: Every day | ORAL | Status: DC

## 2020-10-12 MED ORDER — HEPARIN (PORCINE) IN NACL 1000-0.9 UT/500ML-% IV SOLN
INTRAVENOUS | Status: AC
  Filled 2020-10-12: qty 500

## 2020-10-12 SURGICAL SUPPLY — 26 items
BALLN MUSTANG 5X60X135 (BALLOONS) ×3
BALLOON MUSTANG 5X60X135 (BALLOONS) IMPLANT
CATH ANGIO 5F PIGTAIL 65CM (CATHETERS) ×2 IMPLANT
CATH CROSS OVER TEMPO 5F (CATHETERS) ×1 IMPLANT
CATH QUICKCROSS .035X135CM (MICROCATHETER) ×2 IMPLANT
CATH SOFT-VU 4F 65 STRAIGHT (CATHETERS) ×1 IMPLANT
CATH SOFT-VU STRAIGHT 4F 65CM (CATHETERS) ×3
CATH SOS OMNI O 5F 80CM (CATHETERS) ×1 IMPLANT
CATH STRAIGHT 5FR 65CM (CATHETERS) ×1 IMPLANT
CATH TEMPO 5F RIM 65CM (CATHETERS) ×2 IMPLANT
DEVICE TORQUE .025-.038 (MISCELLANEOUS) ×1 IMPLANT
GLIDEWIRE ADV .035X260CM (WIRE) ×2 IMPLANT
GLIDEWIRE ANGLED NITR .018X260 (WIRE) ×1 IMPLANT
GUIDEWIRE ANGLED .035X260CM (WIRE) ×2 IMPLANT
KIT ENCORE 26 ADVANTAGE (KITS) ×2 IMPLANT
KIT MICROPUNCTURE NIT STIFF (SHEATH) ×1 IMPLANT
KIT PV (KITS) ×3 IMPLANT
SHEATH FLEX ANSEL ANG 6F 45CM (SHEATH) ×2 IMPLANT
SHEATH PINNACLE 5F 10CM (SHEATH) ×2 IMPLANT
SHEATH PROBE COVER 6X72 (BAG) ×2 IMPLANT
STENT ELUVIA 6X60X130 (Permanent Stent) ×2 IMPLANT
SYR MEDRAD MARK V 150ML (SYRINGE) ×1 IMPLANT
TRANSDUCER W/STOPCOCK (MISCELLANEOUS) ×3 IMPLANT
TRAY PV CATH (CUSTOM PROCEDURE TRAY) ×3 IMPLANT
WIRE BENTSON .035X145CM (WIRE) ×2 IMPLANT
WIRE TORQFLEX AUST .018X40CM (WIRE) ×1 IMPLANT

## 2020-10-12 NOTE — Op Note (Signed)
PATIENT: Eddie Chan      MRN: [id number] DOB: 17-Oct-1961    DATE OF PROCEDURE: 10/12/2020  INDICATIONS:    Eddie Chan is a 59 y.o. male who presented with progressive ischemia of the left lower extremity.  He had developed rest pain.  He was set up for arteriography and possible intervention  PROCEDURE:    1.  Conscious sedation 2.  Ultrasound-guided access to the right common femoral artery 3.  Aortogram with bilateral iliac arteriogram 4.  Third order catheterization of the left superficial femoral artery 5.  Angioplasty and stenting of a left superficial femoral artery occlusion 6.  Left lower extremity runoff 7.  Retrograde right femoral arteriogram  SURGEON: Di Kindle. Edilia Bo, MD, FACS  ANESTHESIA: Local with sedation  EBL: Minimal  TECHNIQUE: The patient was brought to the peripheral vascular lab and was sedated. The period of conscious sedation was 74 minutes.  During that time period, I was present face-to-face 100% of the time.  The patient was administered 1 mg of Versed and 50 mcg of fentanyl. The patient's heart rate, blood pressure, and oxygen saturation were monitored by the nurse continuously during the procedure.  Both groins were prepped and draped in the usual sterile fashion.  Under ultrasound guidance, after the skin was anesthetized, I cannulated the right common femoral artery with a micropuncture needle and a micropuncture sheath was introduced over a wire.  This was exchanged for a 5 [place] sheath over a Bentson wire.  By ultrasound the femoral artery was patent. A real-time image was obtained and sent to the server.  A pigtail catheter was positioned at the L1 vertebral body and flush aortogram obtained.  The catheter was then pulled below the renal arteries and an oblique iliac projection was obtained.  I then exchanged the pigtail catheter for a rim catheter which was positioned into the left common iliac artery I was able to advanced a  Glidewire down into the superficial femoral artery.  I exchanged the rim catheter for a straight catheter.  Left lower extremity arteriogram was obtained.  This demonstrated a focal occlusion of the distal superficial femoral artery that was approximately 3 cm in length.  I elected to address this with angioplasty and stenting.  The 5 French sheath was exchanged for a 6 [place] destination sheath which was positioned over the bifurcation into the left superficial femoral artery.  I used the quick cross catheter and the Glidewire advantage wire to position the quick cross catheter just above this occlusion.  I then was able to advanced the wire through the occlusion and then I advanced the quick cross catheter through the occlusion.  Contrast was injected through the catheter after the wire was removed to confirm that we were intraluminal.  Next the patient was heparinized.  ACT was monitored throughout the procedure.  I selected a 6 mm x 60 mm Eluvia drug-eluting stent.  This was positioned across the occlusion and then deployed without difficulty.  Postdilatation was done with a 5 mm x 60 mm Mustang balloon which was inflated to nominal pressure for 1 minute.  Completion arteriogram showed no residual stenosis.  Completion arteriogram showed no evidence of embolic disease distally.  The catheter was then retracted into the right external iliac artery in a retrograde right femoral arteriogram was obtained with right lower extremity runoff  FINDINGS:   1.  There are single renal arteries bilaterally with no significant renal artery stenosis identified.  The infrarenal aorta is  widely patent.  The common iliac, external iliac, and hypogastric arteries are patent.  The stent in the left common iliac artery is widely patent. 2.  On the left side, which is the symptomatic side, the common femoral and deep femoral artery are patent.  The superficial femoral artery is patent proximally.  There is a occlusion of  the distal superficial femoral artery was which was successfully addressed with angioplasty and stenting as described above.  Below that there is a calcific plaque in the popliteal artery at the level of the knee that does not appear to be flow-limiting.  There is three-vessel runoff on the left via the anterior tibial, posterior tibial, and peroneal arteries. 3.  On the right side, the common femoral, deep femoral, and superficial femoral arteries are patent.  There is some mild disease of the popliteal artery at the level of the knee.  There is three-vessel runoff on the right via the anterior tibial, posterior tibial, and peroneal arteries.  The peroneal artery and anterior tibial artery are diminutive distally.  There is some plaque in the proximal anterior tibial artery.  TASC Classification  Largest Sheath Size: 6 [place]  Target vessel: Superficial femoral artery  % Stenosis: Pre 100%. Post 0%.  Lesion length: 3 cm  Calcification: No  Most impactful devices used (Up to 3): 6 mm x 60 mm Eluvia DES, 5 mm x 60 mm Mustang balloon  Outflow: Disease present or not distal to the lesion treated and the  Flow in the distal vessel: Three-vessel runoff on the left   [name], MD, FACS [facility]  DATE OF DICTATION:   10/12/2020

## 2020-10-12 NOTE — CV Procedure (Signed)
Sheath removed at 16:05, manual pressure held for 20 minutes. Right groin site level 0, Tegaderm  And gauze dressing applied.Bedrest to begin at 16:30 per Dr order

## 2020-10-12 NOTE — Interval H&P Note (Signed)
History and Physical Interval Note:  10/12/2020 12:17 PM  Eddie Chan  has presented today for surgery, with the diagnosis of PAD.  The various methods of treatment have been discussed with the patient and family. After consideration of risks, benefits and other options for treatment, the patient has consented to  Procedure(s): ABDOMINAL AORTOGRAM W/LOWER EXTREMITY (Bilateral) as a surgical intervention.  The patient's history has been reviewed, patient examined, no change in status, stable for surgery.  I have reviewed the patient's chart and labs.  Questions were answered to the patient's satisfaction.     [name]

## 2020-10-12 NOTE — Discharge Instructions (Signed)
Femoral Site Care This sheet gives you information about how to care for yourself after your procedure. Your health care provider may also give you more specific instructions. If you have problems or questions, contact your health care provider. What can I expect after the procedure? After the procedure, it is common to have:  Bruising that usually fades within 1-2 weeks.  Tenderness at the site. Follow these instructions at home: Wound care  Follow instructions from your health care provider about how to take care of your insertion site. Make sure you: ? Wash your hands with soap and water before you change your bandage (dressing). If soap and water are not available, use hand sanitizer. ? Change your dressing as told by your health care provider. ? Leave stitches (sutures), skin glue, or adhesive strips in place. These skin closures may need to stay in place for 2 weeks or longer. If adhesive strip edges start to loosen and curl up, you may trim the loose edges. Do not remove adhesive strips completely unless your health care provider tells you to do that.  Do not take baths, swim, or use a hot tub until your health care provider approves.  You may shower 24-48 hours after the procedure or as told by your health care provider. ? Gently wash the site with plain soap and water. ? Pat the area dry with a clean towel. ? Do not rub the site. This may cause bleeding.  Do not apply powder or lotion to the site. Keep the site clean and dry.  Check your femoral site every day for signs of infection. Check for: ? Redness, swelling, or pain. ? Fluid or blood. ? Warmth. ? Pus or a bad smell. Activity  For the first 2-3 days after your procedure, or as long as directed: ? Avoid climbing stairs as much as possible. ? Do not squat.  Do not lift anything that is heavier than 10 lb (4.5 kg), or the limit that you are told, until your health care provider says that it is safe.  Rest as  directed. ? Avoid sitting for a long time without moving. Get up to take short walks every 1-2 hours.  Do not drive for 24 hours if you were given a medicine to help you relax (sedative). General instructions  Take over-the-counter and prescription medicines only as told by your health care provider.  Keep all follow-up visits as told by your health care provider. This is important. Contact a health care provider if you have:  A fever or chills.  You have redness, swelling, or pain around your insertion site. Get help right away if:  The catheter insertion area swells very fast.  You pass out.  You suddenly start to sweat or your skin gets clammy.  The catheter insertion area is bleeding, and the bleeding does not stop when you hold steady pressure on the area.  The area near or just beyond the catheter insertion site becomes pale, cool, tingly, or numb. These symptoms may represent a serious problem that is an emergency. Do not wait to see if the symptoms will go away. Get medical help right away. Call your local emergency services (911 in the [place] Do not drive yourself to the hospital. Summary  After the procedure, it is common to have bruising that usually fades within 1-2 weeks.  Check your femoral site every day for signs of infection.  Do not lift anything that is heavier than 10 lb (4.5 kg), or the   limit that you are told, until your health care provider says that it is safe. This information is not intended to replace advice given to you by your health care provider. Make sure you discuss any questions you have with your health care provider. Document Revised: 10/26/2017 Document Reviewed: 10/26/2017 Elsevier Patient Education  2020 Elsevier Inc.  

## 2020-10-12 NOTE — Progress Notes (Signed)
Patient was given discharge instructions. He verbalized understanding. 

## 2020-10-15 ENCOUNTER — Encounter (HOSPITAL_COMMUNITY): Payer: Self-pay | Admitting: Vascular Surgery

## 2020-11-08 ENCOUNTER — Other Ambulatory Visit: Payer: Self-pay | Admitting: *Deleted

## 2020-11-08 DIAGNOSIS — I739 Peripheral vascular disease, unspecified: Secondary | ICD-10-CM

## 2020-11-21 ENCOUNTER — Ambulatory Visit (HOSPITAL_COMMUNITY)
Admission: RE | Admit: 2020-11-21 | Discharge: 2020-11-21 | Disposition: A | Discharge status: Home / Self Care | Payer: BLUE CROSS/BLUE SHIELD | Source: Ambulatory Visit | Attending: Physician Assistant | Admitting: Physician Assistant

## 2020-11-21 ENCOUNTER — Ambulatory Visit (INDEPENDENT_AMBULATORY_CARE_PROVIDER_SITE_OTHER): Payer: BLUE CROSS/BLUE SHIELD | Admitting: Physician Assistant

## 2020-11-21 ENCOUNTER — Other Ambulatory Visit: Payer: Self-pay

## 2020-11-21 VITALS — BP 146/89 | HR 75 | Temp 98.4°F | Resp 18 | Ht 69.0 in | Wt 139.0 lb

## 2020-11-21 DIAGNOSIS — I739 Peripheral vascular disease, unspecified: Secondary | ICD-10-CM | POA: Insufficient documentation

## 2020-11-21 NOTE — Progress Notes (Signed)
HISTORY AND PHYSICAL     CC:  follow up. Requesting Provider:  Myrlene Broker, *  HPI: This is a 60 y.o. male who is here today for follow up for PAD.  He has history of left common iliac artery stent that was performed in [place], [place]. He has been followed by Dr. Edilia Bo. He was last seen in June of 2021, at which time he was having some left lower extremity claudication. He has known SFA stenosis. Angiography was offered but patient did not want to proceed at that time. He had been compliant with Plavix and Statin. Daily Aspirin 81 mg was encouraged. Smoking cessation was discussed in depth with patient but he was interested in stopping at time of his visit. He was also encouraged to do a walking program. He was given shorter interval follow up.  Pt was last seen 10/03/2020 and at that time, his claudication sx had worsened over the past month.  He could barely walk without pain in his calf and the left leg was significantly worse than the right.  He does get tightness in the right calf with more prolonged ambulation.  He does have neuropathy and cannot tolerate the sheets touching his feet.  He did not have any open wounds.    He was scheduled for arteriogram and had this done on 10/12/2020 by Dr. Edilia Bo at which time he had angioplasty and stenting of the left SFA occlusion.    The pt returns today for follow up.  He states he is able to walk again.  He does not have any claudication in either leg.  He does not have any rest pain or non healing wounds.  He continues to smoke.  He states he does not want to quit but has cut back.  He is taking asa every other day b/c it gives him nose bleeds.    The pt is on a statin for cholesterol management.    The pt is on an aspirin.    Other AC:  Pletal The pt is not on medication for hypertension.  The pt does not have diabetes. Tobacco hx:  current   Past Medical History:  Diagnosis Date  . Hyperlipidemia   . Peripheral arterial disease  [facility])     Past Surgical History:  Procedure Laterality Date  . ABDOMINAL AORTOGRAM W/LOWER EXTREMITY Bilateral 10/12/2020   Procedure: ABDOMINAL AORTOGRAM W/LOWER EXTREMITY;  Surgeon: Chuck Hint, MD;  [facility]AS[facility] CV LAB;  Service: Cardiovascular;  Laterality: Bilateral;  . Left iliac stenting    . PERIPHERAL VASCULAR INTERVENTION Left 10/12/2020   Procedure: PERIPHERAL VASCULAR INTERVENTION;  Surgeon: Chuck Hint, MD;  Location: [facility] INVASIVE CV LAB;  Service: Cardiovascular;  Laterality: Left;  SFA  . SHOULDER SURGERY      Allergies  Allergen Reactions  . Penicillins Other (See Comments)    Break out Reaction:  Childhood    Current Outpatient Medications  Medication Sig Dispense Refill  . acetaminophen (TYLENOL) 500 MG tablet Take 1,000 mg by mouth 2 (two) times daily.    . cilostazol (PLETAL) 100 MG tablet Take 1 tablet (100 mg total) by mouth 2 (two) times daily. before or 2hrs after meal 180 tablet 3  . clopidogrel (PLAVIX) 75 MG tablet Take 1 tablet (75 mg total) by mouth daily. 90 tablet 3  . pravastatin (PRAVACHOL) 40 MG tablet Take 1 tablet (40 mg total) by mouth daily. 90 tablet 3   No current facility-administered medications for  this visit.    Family History  Problem Relation Age of Onset  . Cancer Mother        lung cancer  . Heart disease Father   . Diabetes Father     Social History   Socioeconomic History  . Marital status: Married    Spouse name: Not on file  . Number of children: 2  . Years of education: 7  . Highest education level: Not on file  Occupational History  . Occupation: truck Hospital doctor  Tobacco Use  . Smoking status: Current Every Day Smoker    Packs/day: 1.50    Years: 40.00    Pack years: 60.00    Types: Cigarettes  . Smokeless tobacco: Never Used  Vaping Use  . Vaping Use: Never used  Substance and Sexual Activity  . Alcohol use: Yes    Alcohol/week: 3.0 standard drinks    Types: 3 Cans of  beer per week    Comment: social, 2 beers nightly (12 oz)   . Drug use: No  . Sexual activity: Yes    Birth control/protection: None  Other Topics Concern  . Not on file  Social History Narrative   He drives a dump truck.  Lives at wife and mother in law in a one story home.    He has two children.   Highest level of education: 7th grade   Social Determinants of Health   Financial Resource Strain: Not on file  Food Insecurity: Not on file  Transportation Needs: Not on file  Physical Activity: Not on file  Stress: Not on file  Social Connections: Not on file  Intimate Partner Violence: Not on file     REVIEW OF SYSTEMS:   [X]  denotes positive finding, [ ]  denotes negative finding Cardiac  Comments:  Chest pain or chest pressure:    Shortness of breath upon exertion:    Short of breath when lying flat:    Irregular heart rhythm:        Vascular    Pain in calf, thigh, or hip brought on by ambulation:    Pain in feet at night that wakes you up from your sleep:     Blood clot in your veins:    Leg swelling:         Pulmonary    Oxygen at home:    Productive cough:     Wheezing:         Neurologic    Sudden weakness in arms or legs:     Sudden numbness in arms or legs:     Sudden onset of difficulty speaking or slurred speech:    Temporary loss of vision in one eye:     Problems with dizziness:         Gastrointestinal    Blood in stool:     Vomited blood:         Genitourinary    Burning when urinating:     Blood in urine:        Psychiatric    Major depression:         Hematologic    Bleeding problems:    Problems with blood clotting too easily:        Skin    Rashes or ulcers:        Constitutional    Fever or chills:      PHYSICAL EXAMINATION:  Today's Vitals   11/21/20 1123  BP: (!) 146/89  Pulse: 75  Resp: 18  Temp: 98.4 F (36.9 C)  TempSrc: Temporal  SpO2: 94%  Weight: 139 lb (63 kg)  Height: 5\' 9"  (1.753 m)   Body mass index  is 20.53 kg/m.   General:  WDWN in NAD; vital signs documented above Gait: Normal HENT: WNL, normocephalic Pulmonary: normal non-labored breathing , without wheezing Cardiac: regular HR, without  Murmur; without carotid bruits Abdomen: soft, NT, no masses; aortic pulse is palpable Skin: without rashes Vascular Exam/Pulses:  Right Left  Radial 2+ (normal) 2+ (normal)  Ulnar 2+ (normal) 2+ (normal)  Femoral 2+ (normal) 2+ (normal)  Popliteal Unable to palpate Unable to palpate  DP 2+ (normal) 2+ (normal)  PT 2+ (normal) 2+ (normal)   Extremities: without ischemic changes, without Gangrene , without cellulitis; without open wounds;  Musculoskeletal: no muscle wasting or atrophy  Neurologic: A&O X 3;  No focal weakness or paresthesias are detected Psychiatric:  The pt has Normal affect.   Non-Invasive Vascular Imaging:   ABI's/TBI's on 11/21/2020: Right:  1.03/0.73 (B)- Great toe pressure: 111 Left:  1.09/0.66 (B) - Great toe pressure: 100  Previous ABI's/TBI's on 10/03/2020: Right:  0.94/0.67 - Great toe pressure: 106 Left:  0.55/0 - Great toe pressure:  0  Previous arterial duplex on 10/03/2020: Left Stent(s):  +---------------+--------+--------+--------+--------+  common iliac  PSV cm/sStenosisWaveformComments  +---------------+--------+--------+--------+--------+  Prox to Stent 108                 +---------------+--------+--------+--------+--------+  Proximal Stent 114                 +---------------+--------+--------+--------+--------+  Mid Stent   106                 +---------------+--------+--------+--------+--------+  Distal Stent  112                 +---------------+--------+--------+--------+--------+  Distal to Stent86                 +---------------+--------+--------+--------+--------+   Patent stent with no evidence of restenosis.   Summary:   Stenosis: +-----------------+-----------+  Location     Stent     +-----------------+-----------+  Left Common Iliacno stenosis  +-----------------+-----------+   Left distal SFA occlusion.   Arteriogram 10/12/2020: 1.  There are single renal arteries bilaterally with no significant renal artery stenosis identified.  The infrarenal aorta is widely patent.  The common iliac, external iliac, and hypogastric arteries are patent.  The stent in the left common iliac artery is widely patent. 2.  On the left side, which is the symptomatic side, the common femoral and deep femoral artery are patent.  The superficial femoral artery is patent proximally.  There is a occlusion of the distal superficial femoral artery was which was successfully addressed with angioplasty and stenting as described above.  Below that there is a calcific plaque in the popliteal artery at the level of the knee that does not appear to be flow-limiting.  There is three-vessel runoff on the left via the anterior tibial, posterior tibial, and peroneal arteries. 3.  On the right side, the common femoral, deep femoral, and superficial femoral arteries are patent.  There is some mild disease of the popliteal artery at the level of the knee.  There is three-vessel runoff on the right via the anterior tibial, posterior tibial, and peroneal arteries.  The peroneal artery and anterior tibial artery are diminutive distally.  There is some plaque in the proximal anterior tibial artery.   ASSESSMENT/PLAN:: 60 y.o. male here for follow  up for PAD with history of left common iliac artery stent that was performed in [facility] [place] and most recently angioplasty and stenting of the left SFA occlusion on 10/12/2020 by Dr. Edilia Bo  -pt's ABI much improved today and pt's claudication sx resolved.  Instructed pt that he can discontinue his Pletal and continue his asa and plavix.   -we will see him back in 6 months with ABI, left  aorto-iliac duplex as well as LLE arterial duplex.   -had discussion about importance of smoking cessation.  He states he has cut back but does not want to quit at this time.  -discussed with him if he develops any rest pain or non healing wounds, we would see him back sooner.  He expressed understanding.    Doreatha Massed, [facility] [phone]  Clinic MD:   Edilia Bo

## 2020-11-22 ENCOUNTER — Other Ambulatory Visit: Payer: Self-pay

## 2020-11-22 ENCOUNTER — Telehealth: Payer: Self-pay

## 2020-11-22 DIAGNOSIS — I739 Peripheral vascular disease, unspecified: Secondary | ICD-10-CM

## 2020-11-22 NOTE — Telephone Encounter (Signed)
Pt's call returned- unable to leave VM as the VM box is not set up.

## 2020-11-22 NOTE — Telephone Encounter (Signed)
Pt's wife called and could not remember which medication pt was going to d/c. Per APP note, it was the Pletal and he is to remain on ASA and Plavix. She verbalized understanding. No further questions/concerns.

## 2021-08-12 ENCOUNTER — Other Ambulatory Visit: Payer: Self-pay | Admitting: Internal Medicine

## 2021-08-12 DIAGNOSIS — I739 Peripheral vascular disease, unspecified: Secondary | ICD-10-CM

## 2021-08-21 ENCOUNTER — Other Ambulatory Visit: Payer: Self-pay

## 2021-08-21 ENCOUNTER — Ambulatory Visit (INDEPENDENT_AMBULATORY_CARE_PROVIDER_SITE_OTHER)
Admission: RE | Admit: 2021-08-21 | Discharge: 2021-08-21 | Disposition: A | Discharge status: Home / Self Care | Payer: BLUE CROSS/BLUE SHIELD | Source: Ambulatory Visit | Attending: Vascular Surgery | Admitting: Vascular Surgery

## 2021-08-21 ENCOUNTER — Ambulatory Visit (HOSPITAL_COMMUNITY)
Admission: RE | Admit: 2021-08-21 | Discharge: 2021-08-21 | Disposition: A | Discharge status: Home / Self Care | Payer: BLUE CROSS/BLUE SHIELD | Source: Ambulatory Visit | Attending: Vascular Surgery | Admitting: Vascular Surgery

## 2021-08-21 ENCOUNTER — Ambulatory Visit (INDEPENDENT_AMBULATORY_CARE_PROVIDER_SITE_OTHER): Payer: BLUE CROSS/BLUE SHIELD | Admitting: Physician Assistant

## 2021-08-21 VITALS — BP 132/78 | HR 68 | Temp 98.0°F | Resp 14 | Ht 69.0 in | Wt 137.6 lb

## 2021-08-21 DIAGNOSIS — I739 Peripheral vascular disease, unspecified: Secondary | ICD-10-CM

## 2021-08-21 NOTE — Progress Notes (Signed)
[facility] [place] HISTORY AND PHYSICAL   History of Present Illness:  Patient is a 60 y.o. year old male who presents for evaluation of claudication with progressive ischemia and rest pain in the past.  He is here for a f/u visit after  Angioplasty and stenting of a left superficial femoral artery occlusion and stent in the left common iliac.    He staes he has had good relief of rest pain since his procedure.  He has some cramping in her left foot and decresed sensation at baseline.  He denise rest pain, claudication or non healing wounds.  He does not exercise and contniues to smoke daily.     He had been compliant with Plavix and Statin. Daily Aspirin 81 mg     Past Medical History:  Diagnosis Date   Hyperlipidemia    Peripheral arterial disease (HCC)     Past Surgical History:  Procedure Laterality Date   ABDOMINAL AORTOGRAM W/LOWER EXTREMITY Bilateral 10/12/2020   Procedure: ABDOMINAL AORTOGRAM W/LOWER EXTREMITY;  Surgeon: Chuck Hint, MD;  Location: [facility] INVASIVE CV LAB;  Service: Cardiovascular;  Laterality: Bilateral;   Left iliac stenting     PERIPHERAL VASCULAR INTERVENTION Left 10/12/2020   Procedure: PERIPHERAL VASCULAR INTERVENTION;  Surgeon: Chuck Hint, MD;  Location: [facility] INVASIVE CV LAB;  Service: Cardiovascular;  Laterality: Left;  SFA   SHOULDER SURGERY      ROS:   General:  No weight loss, Fever, chills  HEENT: No recent headaches, no nasal bleeding, no visual changes, no sore throat  Neurologic: No dizziness, blackouts, seizures. No recent symptoms of stroke or mini- stroke. No recent episodes of slurred speech, or temporary blindness.  Cardiac: No recent episodes of chest pain/pressure, no shortness of breath at rest.  No shortness of breath with exertion.  Denies history of atrial fibrillation or irregular heartbeat  Vascular: No history of rest pain in feet.  No history of claudication.  No history of non-healing  ulcer, No history of DVT   Pulmonary: No home oxygen, no productive cough, no hemoptysis,  No asthma or wheezing  Musculoskeletal:  [ ]  Arthritis, [ ]  Low back pain,  [ ]  Joint pain  Hematologic:No history of hypercoagulable state.  No history of easy bleeding.  No history of anemia  Gastrointestinal: No hematochezia or melena,  No gastroesophageal reflux, no trouble swallowing  Urinary: [ ]  chronic Kidney disease, [ ]  on HD - [ ]  MWF or [ ]  TTHS, [ ]  Burning with urination, [ ]  Frequent urination, [ ]  Difficulty urinating;   Skin: No rashes  Psychological: No history of anxiety,  No history of depression  Social History Social History   Tobacco Use   Smoking status: Every Day    Packs/day: 1.50    Years: 40.00    Pack years: 60.00    Types: Cigarettes   Smokeless tobacco: Never  Vaping Use   Vaping Use: Never used  Substance Use Topics   Alcohol use: Yes    Alcohol/week: 3.0 standard drinks    Types: 3 Cans of beer per week    Comment: social, 2 beers nightly (12 oz)    Drug use: No    Family History Family History  Problem Relation Age of Onset   Cancer Mother        lung cancer   Heart disease Father    Diabetes Father     Allergies  Allergies  Allergen Reactions   Penicillins Other (See  Comments)    Break out Reaction:  Childhood     Current Outpatient Medications  Medication Sig Dispense Refill   acetaminophen (TYLENOL) 500 MG tablet Take 1,000 mg by mouth 2 (two) times daily.     cilostazol (PLETAL) 100 MG tablet Take 1 tablet (100 mg total) by mouth 2 (two) times daily. before or 2hrs after meal 180 tablet 3   clopidogrel (PLAVIX) 75 MG tablet Take 1 tablet (75 mg total) by mouth daily. 90 tablet 3   pravastatin (PRAVACHOL) 40 MG tablet Take 1 tablet (40 mg total) by mouth daily. 90 tablet 3   No current facility-administered medications for this visit.    Physical Examination  Vitals:   08/21/21 0945  BP: 132/78  Pulse: 68  Resp:  14  Temp: 98 F (36.7 C)  TempSrc: Temporal  SpO2: 97%  Weight: 137 lb 9.6 oz (62.4 kg)  Height: 5\' 9"  (1.753 m)    Body mass index is 20.32 kg/m.  General:  Alert and oriented, no acute distress HEENT: Normal Neck: No bruit or JVD Pulmonary: Clear to auscultation bilaterally Cardiac: Regular Rate and Rhythm without murmur Abdomen: Soft, non-tender, non-distended, no mass, no scars Skin: No rash Extremity Pulses:  2+ radial, brachial, femoral, dorsalis pedis,  pulses bilaterally Musculoskeletal: No deformity or edema  Neurologic: Upper and lower extremity motor 5/5 and symmetric  DATA:      ABI Findings:  +---------+------------------+-----+--------+--------+  Right    Rt Pressure (mmHg)IndexWaveformComment   +---------+------------------+-----+--------+--------+  Brachial 131                                      +---------+------------------+-----+--------+--------+  PTA      133               0.99 biphasic          +---------+------------------+-----+--------+--------+  DP       135               1.00 biphasic          +---------+------------------+-----+--------+--------+  Great Toe100               0.74 Normal            +---------+------------------+-----+--------+--------+   +---------+------------------+-----+--------+-------+  Left     Lt Pressure (mmHg)IndexWaveformComment  +---------+------------------+-----+--------+-------+  Brachial 135                                     +---------+------------------+-----+--------+-------+  PTA      127               0.94 biphasic         +---------+------------------+-----+--------+-------+  DP       124               0.92 biphasic         +---------+------------------+-----+--------+-------+  Great Toe98                0.73 Normal           +---------+------------------+-----+--------+-------+   +-------+-----------+-----------+------------+------------+   ABI/TBIToday's ABIToday's TBIPrevious ABIPrevious TBI  +-------+-----------+-----------+------------+------------+  Right  1.00       0.74       1.03        0.73          +-------+-----------+-----------+------------+------------+  Left   0.94       0.73       1.09        0.66          +-------+-----------+-----------+------------+------------+      +-----------+---------+-----+--------+----------+--------+  LEFT       PSV cm/s RatioStenosisWaveform  Comments  +-----------+---------+-----+--------+----------+--------+  CFA Prox   83                    biphasic            +-----------+---------+-----+--------+----------+--------+  DFA        143                   biphasic            +-----------+---------+-----+--------+----------+--------+  SFA Prox   115 / 152             biphasic            +-----------+---------+-----+--------+----------+--------+  SFA Mid    130                   triphasic           +-----------+---------+-----+--------+----------+--------+  SFA Distal 137                                       +-----------+---------+-----+--------+----------+--------+  POP Mid    58                    biphasic            +-----------+---------+-----+--------+----------+--------+  ATA Distal 18                    biphasic            +-----------+---------+-----+--------+----------+--------+  PTA Distal 44                    biphasic            +-----------+---------+-----+--------+----------+--------+  PERO Distal12                    monophasic          +-----------+---------+-----+--------+----------+--------+      Left Stent(s):  +---------------+--++---------++  Prox to Stent  68biphasic   +---------------+--++---------++  Proximal Stent 80triphasic  +---------------+--++---------++  Mid Stent      48triphasic  +---------------+--++---------++  Distal Stent    81triphasic  +---------------+--++---------++  Distal to Stent78triphasic  +---------------+--++---------++   Summary:  Left: Patent left SFA stent with no visualized stenosis.       Left Stent(s): LEFT CIA Stent  +---------------+---++--------++  Prox to Stent  96 biphasic  +---------------+---++--------++  Proximal Stent 109biphasic  +---------------+---++--------++  Mid Stent      131biphasic  +---------------+---++--------++  Distal Stent   119biphasic  +---------------+---++--------++  Distal to Stent135biphasic  +---------------+---++--------++   Left CFA 104 cm/s triphasic waveform   Summary:  Patent left common iliac artery stent with no visualized stenosis.     ASSESSMENT:  PAD with progressive ischemia of the left lower extremity and rest pain s/p Angioplasty and stenting of a left superficial femoral artery occlusion by DR. Dickson on 10/12/20 with previous   stent in the left common iliac artery.  The iliac and SFA stents are patent without stenosis and his ABI's are 0.94 on the left and TBI of .73.  He  denise rest pain, symptoms of claudication and non healing wounds.  He does not exercise and continues to smoke daily.  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    PLAN:  I recommended he start some daily exercise such as walking.  I also recommended he stop smoking.  We reviewed signs and symptoms of ischemia and if he develops these he will call. F/U in 1 year for repeat  arterial duplex and ABI's.  Aorta/iliac duplex.     Mosetta Pigeon PA-C [facility] [place] Office: [phone]  MD in clinic [place]

## 2022-01-16 ENCOUNTER — Other Ambulatory Visit: Payer: Self-pay | Admitting: Internal Medicine

## 2022-01-17 ENCOUNTER — Ambulatory Visit (INDEPENDENT_AMBULATORY_CARE_PROVIDER_SITE_OTHER): Payer: BLUE CROSS/BLUE SHIELD | Admitting: Internal Medicine

## 2022-01-17 ENCOUNTER — Encounter: Payer: Self-pay | Admitting: Internal Medicine

## 2022-01-17 ENCOUNTER — Other Ambulatory Visit: Payer: Self-pay

## 2022-01-17 ENCOUNTER — Telehealth: Payer: Self-pay | Admitting: Internal Medicine

## 2022-01-17 VITALS — BP 136/76 | HR 67 | Temp 98.1°F | Ht 69.0 in | Wt 135.0 lb

## 2022-01-17 DIAGNOSIS — I739 Peripheral vascular disease, unspecified: Secondary | ICD-10-CM | POA: Diagnosis not present

## 2022-01-17 DIAGNOSIS — F17200 Nicotine dependence, unspecified, uncomplicated: Secondary | ICD-10-CM

## 2022-01-17 DIAGNOSIS — J449 Chronic obstructive pulmonary disease, unspecified: Secondary | ICD-10-CM | POA: Insufficient documentation

## 2022-01-17 DIAGNOSIS — E782 Mixed hyperlipidemia: Secondary | ICD-10-CM

## 2022-01-17 DIAGNOSIS — J440 Chronic obstructive pulmonary disease with acute lower respiratory infection: Secondary | ICD-10-CM | POA: Diagnosis not present

## 2022-01-17 LAB — LIPID PANEL
Cholesterol: 142 mg/dL (ref 0–200)
HDL: 75.3 mg/dL (ref >39.00)
LDL Cholesterol: 55 mg/dL (ref 0–99)
NonHDL: 66.81
Total CHOL/HDL Ratio: 2
Triglycerides: 58 mg/dL (ref 0.0–149.0)
VLDL: 11.6 mg/dL (ref 0.0–40.0)

## 2022-01-17 LAB — CBC
HCT: 41.7 % (ref 39.0–52.0)
Hemoglobin: 14.2 g/dL (ref 13.0–17.0)
MCHC: 34.1 g/dL (ref 30.0–36.0)
MCV: 100.1 fl — ABNORMAL HIGH (ref 78.0–100.0)
Platelets: 179 10*3/uL (ref 150.0–400.0)
RBC: 4.17 Mil/uL — ABNORMAL LOW (ref 4.22–5.81)
RDW: 12.9 % (ref 11.5–15.5)
WBC: 8.4 10*3/uL (ref 4.0–10.5)

## 2022-01-17 LAB — COMPREHENSIVE METABOLIC PANEL
ALT: 19 U/L (ref 0–53)
AST: 23 U/L (ref 0–37)
Albumin: 4.2 g/dL (ref 3.5–5.2)
Alkaline Phosphatase: 40 U/L (ref 39–117)
BUN: 15 mg/dL (ref 6–23)
CO2: 34 mEq/L — ABNORMAL HIGH (ref 19–32)
Calcium: 9.5 mg/dL (ref 8.4–10.5)
Chloride: 99 mEq/L (ref 96–112)
Creatinine, Ser: 0.85 mg/dL (ref 0.40–1.50)
GFR: 93.65 mL/min (ref >60.00)
Glucose, Bld: 62 mg/dL — ABNORMAL LOW (ref 70–99)
Potassium: 3.3 mEq/L — ABNORMAL LOW (ref 3.5–5.1)
Sodium: 138 mEq/L (ref 135–145)
Total Bilirubin: 0.4 mg/dL (ref 0.2–1.2)
Total Protein: 6.8 g/dL (ref 6.0–8.3)

## 2022-01-17 MED ORDER — CILOSTAZOL 100 MG PO TABS: 100.0000 mg | ORAL_TABLET | Freq: Two times a day (BID) | ORAL | 3 refills | Status: DC

## 2022-01-17 MED ORDER — PRAVASTATIN SODIUM 40 MG PO TABS: 40.0000 mg | ORAL_TABLET | Freq: Every day | ORAL | 3 refills | Status: DC

## 2022-01-17 MED ORDER — CLOPIDOGREL BISULFATE 75 MG PO TABS: 75.0000 mg | ORAL_TABLET | Freq: Every day | ORAL | 3 refills | Status: DC

## 2022-01-17 MED ORDER — PREDNISONE 20 MG PO TABS: 40.0000 mg | ORAL_TABLET | Freq: Every day | ORAL | 0 refills | Status: AC

## 2022-01-17 MED ORDER — ALBUTEROL SULFATE (2.5 MG/3ML) 0.083% IN NEBU: 2.5000 mg | INHALATION_SOLUTION | Freq: Four times a day (QID) | RESPIRATORY_TRACT | 1 refills | Status: DC | PRN

## 2022-01-17 NOTE — Patient Instructions (Signed)
Stop smoking to help the lungs. ? ?We have sent in the prednisone to take 2 pills daily for 1 week to help open the lungs. ? ?We have given you a prescription for the nebulizer.  ? ? ?

## 2022-01-17 NOTE — Assessment & Plan Note (Signed)
Encouraged to stop smoking. He is having bronchitis currently.  ?

## 2022-01-17 NOTE — Progress Notes (Signed)
? ?  Subjective:  ? ?Patient ID: MARX DOIG, male    DOB: 08/01/1961, 61 y.o.   MRN: [id number] ? ?HPI ?The patient is a 61 YO man coming in for urgent care follow up. Last seen 2021.  ? ?Review of Systems  ?Constitutional: Negative.   ?HENT: Negative.    ?Eyes: Negative.   ?Respiratory:  Positive for cough, shortness of breath and wheezing. Negative for chest tightness.   ?Cardiovascular:  Negative for chest pain, palpitations and leg swelling.  ?Gastrointestinal:  Negative for abdominal distention, abdominal pain, constipation, diarrhea, nausea and vomiting.  ?Musculoskeletal: Negative.   ?Skin: Negative.   ?Neurological: Negative.   ?Psychiatric/Behavioral: Negative.    ? ?Objective:  ?Physical Exam ?Constitutional:   ?   Appearance: He is well-developed.  ?HENT:  ?   Head: Normocephalic and atraumatic.  ?Cardiovascular:  ?   Rate and Rhythm: Normal rate and regular rhythm.  ?Pulmonary:  ?   Effort: Pulmonary effort is normal. No respiratory distress.  ?   Breath sounds: Wheezing present. No rales.  ?Abdominal:  ?   General: Bowel sounds are normal. There is no distension.  ?   Palpations: Abdomen is soft.  ?   Tenderness: There is no abdominal tenderness. There is no rebound.  ?Musculoskeletal:  ?   Cervical back: Normal range of motion.  ?Skin: ?   General: Skin is warm and dry.  ?Neurological:  ?   Mental Status: He is alert and oriented to person, place, and time.  ?   Coordination: Coordination normal.  ? ? ?Vitals:  ? 01/17/22 1022  ?BP: 136/76  ?Pulse: 67  ?Temp: 98.1 ?F (36.7 ?C)  ?TempSrc: Oral  ?SpO2: 98%  ?Weight: 135 lb (61.2 kg)  ?Height: 5\' 9"  (1.753 m)  ? ? ?This visit occurred during the SARS-CoV-2 public health emergency.  Safety protocols were in place, including screening questions prior to the visit, additional usage of staff PPE, and extensive cleaning of exam room while observing appropriate contact time as indicated for disinfecting solutions.  ? ?Assessment & Plan:  ? ?

## 2022-01-17 NOTE — Telephone Encounter (Signed)
Pts spouse states pt is currently at the pharmacy and they do not have a rx for albuterol (VENTOLIN HFA) 108 (90 Base) MCG/ACT inhaler ? ?Spouse states cilostazol (PLETAL) 100 MG tablet cost $223 and inquiring if an affordable alternative rx can be sent in  ?

## 2022-01-17 NOTE — Assessment & Plan Note (Signed)
Refilled pletal 100 mg BID. He is up to date with follow up with vascular which is yearly. Encouraged strongly to stop smoking as this is his main source of vascular disease.  ?

## 2022-01-17 NOTE — Assessment & Plan Note (Signed)
With some wheezing today and recently has taken course of azithromycin (finishes tomorrow). Rx prednisone 40 mg daily for 1 week. Advised to quit smoking. Rx nebulizer and albuterol nebulizer solution to help with SOB. He is having lower than his normal oxygen sats with activity at home to 92%. Reviewed results of CXR from urgent care which was without pneumonia. ?

## 2022-01-17 NOTE — Assessment & Plan Note (Signed)
Checking lipid panel today. Refilled pravastatin 40 mg daily and will adjust dosing as needed for goal LDL <100. ?

## 2022-01-17 NOTE — Telephone Encounter (Signed)
We sent in albuterol nebulizer solution for them not inhaler. There is not an alternative to pletal.  ?

## 2022-01-20 ENCOUNTER — Telehealth: Payer: Self-pay | Admitting: Internal Medicine

## 2022-01-20 MED ORDER — ALBUTEROL SULFATE HFA 108 (90 BASE) MCG/ACT IN AERS: 1.0000 | INHALATION_SPRAY | Freq: Four times a day (QID) | RESPIRATORY_TRACT | 2 refills | Status: DC | PRN

## 2022-01-20 NOTE — Telephone Encounter (Signed)
He had an albuterol inhaler with him during visit has this run out? ?

## 2022-01-20 NOTE — Telephone Encounter (Signed)
Pts spouse checking status of request ? ?Spouse states yes, pt has ran out of his albuterol inhaler. ?

## 2022-01-20 NOTE — Telephone Encounter (Signed)
Sent in inhaler. Is he taking plavix? If not needs to fill. This is a blood thinner also and should be reasonably priced. As previously mentioned there is not a cheaper alternative for pletal. The only alternative is xarelto low dose which is also name brand and typically expensive.  ?

## 2022-01-20 NOTE — Telephone Encounter (Signed)
Pts spouse checking status of request, in formed her of provider's message ? ?Spouse inquiring if an inhaler can be prescribed ? ?Spouse also states  "If there is any type of blood thinner that is affordable, can that be prescribed" ? ?Please advise ? ? ?

## 2022-01-20 NOTE — Telephone Encounter (Signed)
Rep w/ [facility] requesting recent doctors notes faxed, regarding need for nebulizer kit ? ?Fax [phone] ? ?

## 2022-01-21 NOTE — Telephone Encounter (Signed)
Office notes have been faxed to the number provided below. Confirmation fax has been received ?

## 2022-03-14 ENCOUNTER — Other Ambulatory Visit: Payer: Self-pay | Admitting: Internal Medicine

## 2022-05-05 ENCOUNTER — Telehealth: Payer: Self-pay

## 2022-05-05 DIAGNOSIS — I739 Peripheral vascular disease, unspecified: Secondary | ICD-10-CM

## 2022-05-05 NOTE — Telephone Encounter (Signed)
Pt's wife called stating that the pt was having more issues and his legs were "locking up." Asked for pt to be called.  Reviewed pt's chart, returned pt's call, two identifiers used. Pt stated that about 4 weeks ago he went to the beach and ever since, his calves get extremely tight about walking approx 200-250 ft. He denies swelling or open wounds. Due to the worsening claudication symptoms, appts were made for ABI and provider visit. Confirmed understanding.

## 2022-05-17 ENCOUNTER — Other Ambulatory Visit: Payer: Self-pay | Admitting: Internal Medicine

## 2022-05-22 ENCOUNTER — Ambulatory Visit: Payer: BLUE CROSS/BLUE SHIELD | Admitting: Vascular Surgery

## 2022-05-22 ENCOUNTER — Encounter (HOSPITAL_COMMUNITY): Payer: BLUE CROSS/BLUE SHIELD

## 2022-06-03 ENCOUNTER — Telehealth: Payer: Self-pay

## 2022-06-03 NOTE — Telephone Encounter (Signed)
Pt's wife, Rosey Bath, called stating that the pt's legs were worse. His calves are "locking up" and it's taking longer each time to recover. Left pt's phone # to call.  Reviewed pt's chart, called pt, no answer, vm not setup.  Called wife and rescheduled canceled appts from July. Confirmed understanding.

## 2022-06-19 ENCOUNTER — Ambulatory Visit (HOSPITAL_COMMUNITY)
Admission: RE | Admit: 2022-06-19 | Discharge: 2022-06-19 | Disposition: A | Discharge status: Home / Self Care | Payer: BLUE CROSS/BLUE SHIELD | Source: Ambulatory Visit | Attending: Cardiology | Admitting: Cardiology

## 2022-06-19 ENCOUNTER — Other Ambulatory Visit: Payer: Self-pay

## 2022-06-19 ENCOUNTER — Ambulatory Visit (INDEPENDENT_AMBULATORY_CARE_PROVIDER_SITE_OTHER): Payer: BLUE CROSS/BLUE SHIELD | Admitting: Vascular Surgery

## 2022-06-19 ENCOUNTER — Encounter: Payer: Self-pay | Admitting: Vascular Surgery

## 2022-06-19 VITALS — BP 138/77 | HR 70 | Temp 98.3°F | Resp 20 | Ht 69.0 in | Wt 138.0 lb

## 2022-06-19 DIAGNOSIS — I739 Peripheral vascular disease, unspecified: Secondary | ICD-10-CM | POA: Insufficient documentation

## 2022-06-19 DIAGNOSIS — I70219 Atherosclerosis of native arteries of extremities with intermittent claudication, unspecified extremity: Secondary | ICD-10-CM

## 2022-06-19 NOTE — Progress Notes (Signed)
  REASON FOR VISIT:   Follow-up of peripheral arterial disease.  MEDICAL ISSUES:   PERIPHERAL ARTERIAL DISEASE: This patient has had a previous left common iliac artery stent and a left superficial femoral artery a stent for an occluded SFA.  He developed a fairly acute onset of symptoms in both legs and has had a significant drop in his ABI on the left.  I would suspect that he had occluded his stent however he does have a palpable popliteal pulse and dorsalis pedis pulse.  The other possibility would be atheroembolic disease.  Regardless given the rapid progression of his symptoms and drop in ABI think we should pursue arteriography.  I have reviewed with the patient the indications for arteriography. In addition, I have reviewed the potential complications of arteriography including but not limited to: Bleeding, arterial injury, arterial thrombosis, dye action, renal insufficiency, or other unpredictable medical problems. I have explained to the patient that if we find disease amenable to angioplasty we could potentially address this at the same time. I have discussed the potential complications of angioplasty and stenting, including but not limited to: Bleeding, arterial thrombosis, arterial injury, dissection, or the need for surgical intervention.  This procedure has been scheduled for 06/27/2022.  HPI:   Eddie Chan is a pleasant 61 y.o. male who presents for continued follow-up of his peripheral arterial disease.  Of note he previously had a left common iliac artery stent placed in [place].  He subsequently presented with disabling claudication on the left.  We had multiple discussions about the importance of tobacco cessation.  We were following this conservatively but ultimately his symptoms progressed and he wished to pursue arteriography.  On 10/12/2020 he underwent angioplasty and stenting of a left superficial femoral artery occlusion.  He was last seen in our office in October  2022 at which point his common iliac artery stent and his SFA stent were patent.  He states that about a month ago he developed fairly sudden onset of pain in both lower extremities which is brought on by ambulation and relieved with rest.  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  His symptoms are more significant on the left side.  I do not get any history of rest pain or nonhealing ulcers.  He continues to smoke about 1-1/2 packs/day and has been smoking for over 40 years.  He is on Plavix and is on a statin.  His risk factors for peripheral arterial disease include hypercholesterolemia and tobacco use.  He denies any history of diabetes, hypertension, or family history of premature cardiovascular disease.    Past Medical History:  Diagnosis Date   Hyperlipidemia    Peripheral arterial disease (HCC)     Family History  Problem Relation Age of Onset   Cancer Mother        lung cancer   Heart disease Father    Diabetes Father     SOCIAL HISTORY: Social History   Tobacco Use   Smoking status: Every Day    Packs/day: 1.50    Years: 40.00    Total pack years: 60.00    Types: Cigarettes   Smokeless tobacco: Never  Substance Use Topics   Alcohol use: Yes    Alcohol/week: 3.0 standard drinks of alcohol    Types: 3 Cans of beer per week    Comment: social, 2 beers nightly (12 oz)     Allergies  Allergen Reactions     Penicillins Other (See Comments)    Break out Reaction:  Childhood    Current Outpatient Medications  Medication Sig Dispense Refill   acetaminophen (TYLENOL) 500 MG tablet Take 1,000 mg by mouth 2 (two) times daily.     albuterol (PROVENTIL) (2.5 MG/3ML) 0.083% nebulizer solution INHALE 3 ML BY NEBULIZATION EVERY 6 HOURS AS NEEDED FOR WHEEZING OR SHORTNESS OF BREATH 150 mL 1   albuterol (VENTOLIN HFA) 108 (90 Base) MCG/ACT inhaler INHALE 1 PUFF INTO THE LUNGS EVERY 6 HOURS AS NEEDED FOR WHEEZING OR SHORTNESS OF  BREATH. 6.7 each 2   cilostazol (PLETAL) 100 MG tablet Take 1 tablet (100 mg total) by mouth 2 (two) times daily. before or 2hrs after meal 180 tablet 3   clopidogrel (PLAVIX) 75 MG tablet Take 1 tablet (75 mg total) by mouth daily. 90 tablet 3   pravastatin (PRAVACHOL) 40 MG tablet Take 1 tablet (40 mg total) by mouth daily. 90 tablet 3   No current facility-administered medications for this visit.    REVIEW OF SYSTEMS:  [X]  denotes positive finding, [ ]  denotes negative finding Cardiac  Comments:  Chest pain or chest pressure:    Shortness of breath upon exertion:    Short of breath when lying flat:    Irregular heart rhythm:        Vascular    Pain in calf, thigh, or hip brought on by ambulation: x   Pain in feet at night that wakes you up from your sleep:     Blood clot in your veins:    Leg swelling:         Pulmonary    Oxygen at home:    Productive cough:     Wheezing:         Neurologic    Sudden weakness in arms or legs:     Sudden numbness in arms or legs:     Sudden onset of difficulty speaking or slurred speech:    Temporary loss of vision in one eye:     Problems with dizziness:         Gastrointestinal    Blood in stool:     Vomited blood:         Genitourinary    Burning when urinating:     Blood in urine:        Psychiatric    Major depression:         Hematologic    Bleeding problems:    Problems with blood clotting too easily:        Skin    Rashes or ulcers:        Constitutional    Fever or chills:     PHYSICAL EXAM:   Vitals:   06/19/22 1515  BP: 138/77  Pulse: 70  Resp: 20  Temp: 98.3 F (36.8 C)  SpO2: 95%  Weight: 138 lb (62.6 kg)  Height: 5\' 9"  (1.753 m)    GENERAL: The patient is a well-nourished male, in no acute distress. The vital signs are documented above. CARDIAC: There is a regular rate and rhythm.  VASCULAR: I do not detect carotid bruits. On the right side, he has a palpable femoral, popliteal, dorsalis  pedis, and posterior tibial pulse. On the left side he has a palpable femoral, popliteal, and dorsalis pedis pulse.  I cannot obtain a posterior tibial pulse. His feet appear adequately perfused. PULMONARY: There is good air exchange bilaterally without wheezing or rales. ABDOMEN: Soft and non-tender  with normal pitched bowel sounds.  MUSCULOSKELETAL: There are no major deformities or cyanosis. NEUROLOGIC: No focal weakness or paresthesias are detected. SKIN: There are no ulcers or rashes noted. PSYCHIATRIC: The patient has a normal affect.  DATA:    ARTERIAL DOPPLER STUDY: I have independently interpreted his arterial Doppler study today.  On the right side he has a triphasic posterior tibial signal with a biphasic dorsalis pedis signal.  ABIs 92%.  Toe pressures 89 mmHg.  On the left side he has a monophasic dorsalis pedis and posterior tibial signal.  ABIs 69%.  Toe pressures 51 mmHg.  [name] [facility] [phone]

## 2022-06-19 NOTE — H&P (View-Only) (Signed)
REASON FOR VISIT:   Follow-up of peripheral arterial disease.  MEDICAL ISSUES:   PERIPHERAL ARTERIAL DISEASE: This patient has had a previous left common iliac artery stent and a left superficial femoral artery a stent for an occluded SFA.  He developed a fairly acute onset of symptoms in both legs and has had a significant drop in his ABI on the left.  I would suspect that he had occluded his stent however he does have a palpable popliteal pulse and dorsalis pedis pulse.  The other possibility would be atheroembolic disease.  Regardless given the rapid progression of his symptoms and drop in ABI think we should pursue arteriography.  I have reviewed with the patient the indications for arteriography. In addition, I have reviewed the potential complications of arteriography including but not limited to: Bleeding, arterial injury, arterial thrombosis, dye action, renal insufficiency, or other unpredictable medical problems. I have explained to the patient that if we find disease amenable to angioplasty we could potentially address this at the same time. I have discussed the potential complications of angioplasty and stenting, including but not limited to: Bleeding, arterial thrombosis, arterial injury, dissection, or the need for surgical intervention.  This procedure has been scheduled for 06/27/2022.  HPI:   Eddie Chan is a pleasant 61 y.o. male who presents for continued follow-up of his peripheral arterial disease.  Of note he previously had a left common iliac artery stent placed in [place].  He subsequently presented with disabling claudication on the left.  We had multiple discussions about the importance of tobacco cessation.  We were following this conservatively but ultimately his symptoms progressed and he wished to pursue arteriography.  On 10/12/2020 he underwent angioplasty and stenting of a left superficial femoral artery occlusion.  He was last seen in our office in October  2022 at which point his common iliac artery stent and his SFA stent were patent.  He states that about a month ago he developed fairly sudden onset of pain in both lower extremities which is brought on by ambulation and relieved with rest.  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  His symptoms are more significant on the left side.  I do not get any history of rest pain or nonhealing ulcers.  He continues to smoke about 1-1/2 packs/day and has been smoking for over 40 years.  He is on Plavix and is on a statin.  His risk factors for peripheral arterial disease include hypercholesterolemia and tobacco use.  He denies any history of diabetes, hypertension, or family history of premature cardiovascular disease.    Past Medical History:  Diagnosis Date   Hyperlipidemia    Peripheral arterial disease (HCC)     Family History  Problem Relation Age of Onset   Cancer Mother        lung cancer   Heart disease Father    Diabetes Father     SOCIAL HISTORY: Social History   Tobacco Use   Smoking status: Every Day    Packs/day: 1.50    Years: 40.00    Total pack years: 60.00    Types: Cigarettes   Smokeless tobacco: Never  Substance Use Topics   Alcohol use: Yes    Alcohol/week: 3.0 standard drinks of alcohol    Types: 3 Cans of beer per week    Comment: social, 2 beers nightly (12 oz)     Allergies  Allergen Reactions  Penicillins Other (See Comments)    Break out Reaction:  Childhood    Current Outpatient Medications  Medication Sig Dispense Refill   acetaminophen (TYLENOL) 500 MG tablet Take 1,000 mg by mouth 2 (two) times daily.     albuterol (PROVENTIL) (2.5 MG/3ML) 0.083% nebulizer solution INHALE 3 ML BY NEBULIZATION EVERY 6 HOURS AS NEEDED FOR WHEEZING OR SHORTNESS OF BREATH 150 mL 1   albuterol (VENTOLIN HFA) 108 (90 Base) MCG/ACT inhaler INHALE 1 PUFF INTO THE LUNGS EVERY 6 HOURS AS NEEDED FOR WHEEZING OR SHORTNESS OF  BREATH. 6.7 each 2   cilostazol (PLETAL) 100 MG tablet Take 1 tablet (100 mg total) by mouth 2 (two) times daily. before or 2hrs after meal 180 tablet 3   clopidogrel (PLAVIX) 75 MG tablet Take 1 tablet (75 mg total) by mouth daily. 90 tablet 3   pravastatin (PRAVACHOL) 40 MG tablet Take 1 tablet (40 mg total) by mouth daily. 90 tablet 3   No current facility-administered medications for this visit.    REVIEW OF SYSTEMS:  [X]  denotes positive finding, [ ]  denotes negative finding Cardiac  Comments:  Chest pain or chest pressure:    Shortness of breath upon exertion:    Short of breath when lying flat:    Irregular heart rhythm:        Vascular    Pain in calf, thigh, or hip brought on by ambulation: x   Pain in feet at night that wakes you up from your sleep:     Blood clot in your veins:    Leg swelling:         Pulmonary    Oxygen at home:    Productive cough:     Wheezing:         Neurologic    Sudden weakness in arms or legs:     Sudden numbness in arms or legs:     Sudden onset of difficulty speaking or slurred speech:    Temporary loss of vision in one eye:     Problems with dizziness:         Gastrointestinal    Blood in stool:     Vomited blood:         Genitourinary    Burning when urinating:     Blood in urine:        Psychiatric    Major depression:         Hematologic    Bleeding problems:    Problems with blood clotting too easily:        Skin    Rashes or ulcers:        Constitutional    Fever or chills:     PHYSICAL EXAM:   Vitals:   06/19/22 1515  BP: 138/77  Pulse: 70  Resp: 20  Temp: 98.3 F (36.8 C)  SpO2: 95%  Weight: 138 lb (62.6 kg)  Height: 5\' 9"  (1.753 m)    GENERAL: The patient is a well-nourished male, in no acute distress. The vital signs are documented above. CARDIAC: There is a regular rate and rhythm.  VASCULAR: I do not detect carotid bruits. On the right side, he has a palpable femoral, popliteal, dorsalis  pedis, and posterior tibial pulse. On the left side he has a palpable femoral, popliteal, and dorsalis pedis pulse.  I cannot obtain a posterior tibial pulse. His feet appear adequately perfused. PULMONARY: There is good air exchange bilaterally without wheezing or rales. ABDOMEN: Soft and non-tender  with normal pitched bowel sounds.  MUSCULOSKELETAL: There are no major deformities or cyanosis. NEUROLOGIC: No focal weakness or paresthesias are detected. SKIN: There are no ulcers or rashes noted. PSYCHIATRIC: The patient has a normal affect.  DATA:    ARTERIAL DOPPLER STUDY: I have independently interpreted his arterial Doppler study today.  On the right side he has a triphasic posterior tibial signal with a biphasic dorsalis pedis signal.  ABIs 92%.  Toe pressures 89 mmHg.  On the left side he has a monophasic dorsalis pedis and posterior tibial signal.  ABIs 69%.  Toe pressures 51 mmHg.  [name] [facility] [phone]

## 2022-06-27 ENCOUNTER — Encounter (HOSPITAL_COMMUNITY)
Admission: RE | Disposition: A | Discharge status: Home / Self Care | Payer: Self-pay | Source: Home / Self Care | Attending: Vascular Surgery

## 2022-06-27 ENCOUNTER — Other Ambulatory Visit: Payer: Self-pay

## 2022-06-27 ENCOUNTER — Ambulatory Visit (HOSPITAL_COMMUNITY)
Admission: RE | Admit: 2022-06-27 | Discharge: 2022-06-27 | Disposition: A | Discharge status: Home / Self Care | Payer: BLUE CROSS/BLUE SHIELD | Attending: Vascular Surgery | Admitting: Vascular Surgery

## 2022-06-27 DIAGNOSIS — I70212 Atherosclerosis of native arteries of extremities with intermittent claudication, left leg: Secondary | ICD-10-CM | POA: Insufficient documentation

## 2022-06-27 DIAGNOSIS — E7800 Pure hypercholesterolemia, unspecified: Secondary | ICD-10-CM | POA: Insufficient documentation

## 2022-06-27 DIAGNOSIS — Z7902 Long term (current) use of antithrombotics/antiplatelets: Secondary | ICD-10-CM | POA: Diagnosis not present

## 2022-06-27 DIAGNOSIS — F17210 Nicotine dependence, cigarettes, uncomplicated: Secondary | ICD-10-CM | POA: Insufficient documentation

## 2022-06-27 DIAGNOSIS — I739 Peripheral vascular disease, unspecified: Secondary | ICD-10-CM

## 2022-06-27 HISTORY — PX: ABDOMINAL AORTOGRAM W/LOWER EXTREMITY: CATH118223

## 2022-06-27 LAB — POCT I-STAT, CHEM 8
BUN: 9 mg/dL (ref 8–23)
Calcium, Ion: 1.21 mmol/L (ref 1.15–1.40)
Chloride: 99 mmol/L (ref 98–111)
Creatinine, Ser: 0.7 mg/dL (ref 0.61–1.24)
Glucose, Bld: 91 mg/dL (ref 70–99)
HCT: 45 % (ref 39.0–52.0)
Hemoglobin: 15.3 g/dL (ref 13.0–17.0)
Potassium: 4.3 mmol/L (ref 3.5–5.1)
Sodium: 137 mmol/L (ref 135–145)
TCO2: 29 mmol/L (ref 22–32)

## 2022-06-27 SURGERY — ABDOMINAL AORTOGRAM W/LOWER EXTREMITY
Anesthesia: LOCAL

## 2022-06-27 MED ORDER — ONDANSETRON HCL 4 MG/2ML IJ SOLN: 4.0000 mg | Freq: Four times a day (QID) | INTRAMUSCULAR | Status: DC | PRN

## 2022-06-27 MED ORDER — FENTANYL CITRATE (PF) 100 MCG/2ML IJ SOLN
INTRAMUSCULAR | Status: DC | PRN
  Administered 2022-06-27: 50 ug via INTRAVENOUS

## 2022-06-27 MED ORDER — SODIUM CHLORIDE 0.9 % IV SOLN: INTRAVENOUS | Status: DC

## 2022-06-27 MED ORDER — LIDOCAINE HCL (PF) 1 % IJ SOLN
INTRAMUSCULAR | Status: AC
  Filled 2022-06-27: qty 30

## 2022-06-27 MED ORDER — HEPARIN (PORCINE) IN NACL 1000-0.9 UT/500ML-% IV SOLN
INTRAVENOUS | Status: AC
  Filled 2022-06-27: qty 1000

## 2022-06-27 MED ORDER — SODIUM CHLORIDE 0.9% FLUSH: 3.0000 mL | INTRAVENOUS | Status: DC | PRN

## 2022-06-27 MED ORDER — LIDOCAINE HCL (PF) 1 % IJ SOLN
INTRAMUSCULAR | Status: DC | PRN
  Administered 2022-06-27: 15 mL

## 2022-06-27 MED ORDER — HEPARIN (PORCINE) IN NACL 1000-0.9 UT/500ML-% IV SOLN
INTRAVENOUS | Status: DC | PRN
  Administered 2022-06-27 (×2): 500 mL

## 2022-06-27 MED ORDER — IODIXANOL 320 MG/ML IV SOLN
INTRAVENOUS | Status: DC | PRN
  Administered 2022-06-27: 127 mL

## 2022-06-27 MED ORDER — HYDRALAZINE HCL 20 MG/ML IJ SOLN: 5.0000 mg | INTRAMUSCULAR | Status: DC | PRN

## 2022-06-27 MED ORDER — MIDAZOLAM HCL 2 MG/2ML IJ SOLN
INTRAMUSCULAR | Status: AC
  Filled 2022-06-27: qty 2

## 2022-06-27 MED ORDER — MIDAZOLAM HCL 2 MG/2ML IJ SOLN
INTRAMUSCULAR | Status: DC | PRN
  Administered 2022-06-27: 1 mg via INTRAVENOUS

## 2022-06-27 MED ORDER — FENTANYL CITRATE (PF) 100 MCG/2ML IJ SOLN
INTRAMUSCULAR | Status: AC
  Filled 2022-06-27: qty 2

## 2022-06-27 MED ORDER — SODIUM CHLORIDE 0.9 % WEIGHT BASED INFUSION
1.0000 mL/kg/h | INTRAVENOUS | Status: DC
  Administered 2022-06-27: 1 mL/kg/h via INTRAVENOUS

## 2022-06-27 MED ORDER — SODIUM CHLORIDE 0.9 % IV SOLN: 250.0000 mL | INTRAVENOUS | Status: DC | PRN

## 2022-06-27 MED ORDER — ACETAMINOPHEN 325 MG PO TABS: 650.0000 mg | ORAL_TABLET | ORAL | Status: DC | PRN

## 2022-06-27 MED ORDER — LABETALOL HCL 5 MG/ML IV SOLN: 10.0000 mg | INTRAVENOUS | Status: DC | PRN

## 2022-06-27 MED ORDER — SODIUM CHLORIDE 0.9% FLUSH: 3.0000 mL | Freq: Two times a day (BID) | INTRAVENOUS | Status: DC

## 2022-06-27 SURGICAL SUPPLY — 9 items
CATH ANGIO 5F BER2 65CM (CATHETERS) IMPLANT
CATH ANGIO 5F PIGTAIL 65CM (CATHETERS) IMPLANT
KIT MICROPUNCTURE NIT STIFF (SHEATH) IMPLANT
KIT PV (KITS) ×1 IMPLANT
SHEATH PINNACLE 5F 10CM (SHEATH) IMPLANT
SYR MEDRAD MARK V 150ML (SYRINGE) IMPLANT
TRANSDUCER W/STOPCOCK (MISCELLANEOUS) ×1 IMPLANT
TRAY PV CATH (CUSTOM PROCEDURE TRAY) ×1 IMPLANT
WIRE HITORQ VERSACORE ST 145CM (WIRE) IMPLANT

## 2022-06-27 NOTE — Interval H&P Note (Signed)
History and Physical Interval Note:  06/27/2022 9:52 AM  Eddie Chan  has presented today for surgery, with the diagnosis of PAD.  The various methods of treatment have been discussed with the patient and family. After consideration of risks, benefits and other options for treatment, the patient has consented to  Procedure(s): ABDOMINAL AORTOGRAM W/LOWER EXTREMITY (N/A) as a surgical intervention.  The patient's history has been reviewed, patient examined, no change in status, stable for surgery.  I have reviewed the patient's chart and labs.  Questions were answered to the patient's satisfaction.     [name]

## 2022-06-27 NOTE — Progress Notes (Signed)
SITE AREA: right groin/femoral  SITE PRIOR TO REMOVAL:  LEVEL 0  PRESSURE APPLIED FOR: approximately 20 minutes  MANUAL: yes  PATIENT STATUS DURING PULL: stable  POST PULL SITE:  LEVEL 0  POST PULL INSTRUCTIONS GIVEN: yes  POST PULL PULSES PRESENT: bilateral pedal pulses at +2, r > l  DRESSING APPLIED: gauze with tegaderm   BEDREST BEGINS @ 1136  COMMENTS: Sheath removed by 2H RN, Lanora Manis

## 2022-06-27 NOTE — Op Note (Signed)
PATIENT: Eddie Chan      MRN: [id number] DOB: Dec 27, 1960    DATE OF PROCEDURE: 06/27/2022  INDICATIONS:    Eddie Chan is a 61 y.o. male who presented with worsening claudication symptoms in the left leg and had a significant drop in his ABI.  He had a previous left common iliac artery stent and left SFA stent for a short segment SFA occlusion.  He presents for arteriography.  PROCEDURE:    Conscious sedation Ultrasound guided access to the right common femoral artery Aortogram with bilateral lower extremity runoff.  SURGEON: Di Kindle. Edilia Bo, MD, FACS  ANESTHESIA: Local with sedation  EBL: Minimal  TECHNIQUE: The patient was brought to the peripheral vascular lab and was sedated. The period of conscious sedation was 36 minutes.  During that time period, I was present face-to-face 100% of the time.  The patient was administered 1 mg of Versed and 50 mcg of fentanyl. The patient's heart rate, blood pressure, and oxygen saturation were monitored by the nurse continuously during the procedure.  Both groins were prepped and draped in the usual sterile fashion.  Under ultrasound guidance, after the skin was anesthetized, I cannulated the right common femoral artery with a micropuncture needle and a micropuncture sheath was introduced over a wire.  This was exchanged for a 5 [place] sheath over a Bentson wire.  By ultrasound the femoral artery was patent. A real-time image was obtained and sent to the server.  A pigtail catheter was positioned in the L1 vertebral body and flush aortogram obtained.  The catheter was in position above the aortic bifurcation and an oblique iliac projection was obtained.  Next bilateral lower extremity runoff films were obtained.  The catheter was then removed over a wire.  The patient was transferred to the holding area for removal of the sheath.  No immediate complications were noted.  FINDINGS:   No significant renal artery stenosis is identified.   There is an accessory renal artery on the left.  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 On the right side the common iliac and external iliac arteries are patent.  The hypogastric artery is occluded.  The right common femoral, deep femoral, superficial femoral, and popliteal arteries are patent.  There is mild diffuse disease.  The anterior tibial, tibial peroneal trunk, proximal peroneal, and posterior tibial arteries are patent.  There is poor visualization of the tibials distally. On the left side, the left common iliac artery stent is patent.  The hypogastric artery is patent.  The left external iliac artery is patent.  There is an eccentric plaque in the left common femoral artery and also some scattered plaque in the proximal superficial femoral artery.  The left SFA stent is patent.  There is an eccentric plaque just above the knee.  The below-knee popliteal artery, anterior tibial, tibial peroneal trunk, proximal peroneal, and posterior tibial arteries are patent.  There is poor visualization distally.  CLINICAL NOTE: As his symptoms have been progressing he would like to pursue an aggressive work-up to his findings on his arteriogram today.  I think the best option would be left femoral endarterectomy and then retrograde angioplasty and stenting of the left popliteal stenosis.  I would likely predilatation with a 4 mm balloon and then used a 5 mm drug-coated balloon in the popliteal artery.  I will arrange this on a Friday when we can use room 16.  I have discussed the indications for the procedure and the  potential complications with the patient.  He is agreeable to proceed.  Eddie Ferrari, MD, FACS [facility]  DATE OF DICTATION:   06/27/2022

## 2022-07-01 ENCOUNTER — Encounter (HOSPITAL_COMMUNITY): Payer: Self-pay | Admitting: Vascular Surgery

## 2022-07-01 ENCOUNTER — Other Ambulatory Visit: Payer: Self-pay

## 2022-07-01 DIAGNOSIS — I739 Peripheral vascular disease, unspecified: Secondary | ICD-10-CM

## 2022-07-10 NOTE — Progress Notes (Signed)
Surgical Instructions    Your procedure is scheduled on Friday September 22nd.  Report to [facility] Main Entrance "A" at 7:15 A.M., then check in with the Admitting office.  Call this number if you have problems the morning of surgery:  [phone]   If you have any questions prior to your surgery date call [phone]: Open Monday-Friday 8am-4pm    Remember:  Do not eat or drink after midnight the night before your surgery     Take these medicines the morning of surgery with A SIP OF WATER:  pravastatin (PRAVACHOL) 40 MG tablet   IF NEEDED acetaminophen (TYLENOL) 500 MG tablet albuterol (PROVENTIL) (2.5 MG/3ML) 0.083% nebulizer solution albuterol (VENTOLIN HFA) 108 (90 Base) MCG/ACT inhaler please bring with you to the hospital polyvinyl alcohol (LIQUIFILM TEARS) 1.4 % ophthalmic solution    Follow your surgeon's instructions on when to stop Aspirin.  If no instructions were given by your surgeon then you will need to call the office to get those instructions.    Follow your surgeon's instructions on when to stop Plavix.  If no instructions were given by your surgeon then you will need to call the office to get those instructions.    Follow your surgeon's instructions on when to stop Pletal.  If no instructions were given by your surgeon then you will need to call the office to get those instructions.    As of today, STOP taking any Aspirin (unless otherwise instructed by your surgeon) Aleve, Naproxen, Ibuprofen, Motrin, Advil, Goody's, BC's, all herbal medications, fish oil, and all vitamins.           Do not wear jewelry Do not wear lotions, powders, cologne or deodorant. Do not shave 48 hours prior to surgery.  Men may shave face and neck. Do not bring valuables to the hospital. Do not wear nail polish  [facility] is not responsible for any belongings or valuables.    Do NOT Smoke (Tobacco/Vaping)  24 hours prior to your procedure  If you use a CPAP at night, you  may bring your mask for your overnight stay.   Contacts, glasses, hearing aids, dentures or partials may not be worn into surgery, please bring cases for these belongings   For patients admitted to the hospital, discharge time will be determined by your treatment team.   Patients discharged the day of surgery will not be allowed to drive home, and someone needs to stay with them for 24 hours.   SURGICAL WAITING ROOM VISITATION Patients having surgery or a procedure may have no more than 2 support people in the waiting area - these visitors may rotate.   Children under the age of 97 must have an adult with them who is not the patient. If the patient needs to stay at the hospital during part of their recovery, the visitor guidelines for inpatient rooms apply. Pre-op nurse will coordinate an appropriate time for 1 support person to accompany patient in pre-op.  This support person may not rotate.   Please refer to the [facility] website for the visitor guidelines for Inpatients (after your surgery is over and you are in a regular room).    Special instructions:    Oral Hygiene is also important to reduce your risk of infection.  Remember - BRUSH YOUR TEETH THE MORNING OF SURGERY WITH YOUR REGULAR TOOTHPASTE   [place]- Preparing For Surgery  Before surgery, you can play an important role. Because skin is not sterile, your skin needs  to be as free of germs as possible. You can reduce the number of germs on your skin by washing with CHG (chlorahexidine gluconate) Soap before surgery.  CHG is an antiseptic cleaner which kills germs and bonds with the skin to continue killing germs even after washing.     Please do not use if you have an allergy to CHG or antibacterial soaps. If your skin becomes reddened/irritated stop using the CHG.  Do not shave (including legs and underarms) for at least 48 hours prior to first CHG shower. It is OK to shave your face.  Please follow these instructions  carefully.     Shower the NIGHT BEFORE SURGERY and the MORNING OF SURGERY with CHG Soap.   If you chose to wash your hair, wash your hair first as usual with your normal shampoo. After you shampoo, rinse your hair and body thoroughly to remove the shampoo.  Then [employer] and genitals (private parts) with your normal soap and rinse thoroughly to remove soap.  After that Use CHG Soap as you would any other liquid soap. You can apply CHG directly to the skin and wash gently with a scrungie or a clean washcloth.   Apply the CHG Soap to your body ONLY FROM THE NECK DOWN.  Do not use on open wounds or open sores. Avoid contact with your eyes, ears, mouth and genitals (private parts). Wash Face and genitals (private parts)  with your normal soap.   Wash thoroughly, paying special attention to the area where your surgery will be performed.  Thoroughly rinse your body with warm water from the neck down.  DO NOT shower/wash with your normal soap after using and rinsing off the CHG Soap.  Pat yourself dry with a CLEAN TOWEL.  Wear CLEAN PAJAMAS to bed the night before surgery  Place CLEAN SHEETS on your bed the night before your surgery  DO NOT SLEEP WITH PETS.   Day of Surgery:  Take a shower with CHG soap. Wear Clean/Comfortable clothing the morning of surgery Do not apply any deodorants/lotions.   Remember to brush your teeth WITH YOUR REGULAR TOOTHPASTE.    If you received a COVID test during your pre-op visit, it is requested that you wear a mask when out in public, stay away from anyone that may not be feeling well, and notify your surgeon if you develop symptoms. If you have been in contact with anyone that has tested positive in the last 10 days, please notify your surgeon.    Please read over the following fact sheets that you were given.

## 2022-07-11 ENCOUNTER — Encounter (HOSPITAL_COMMUNITY)
Admission: RE | Admit: 2022-07-11 | Discharge: 2022-07-11 | Disposition: A | Discharge status: Home / Self Care | Payer: BLUE CROSS/BLUE SHIELD | Source: Ambulatory Visit | Attending: Vascular Surgery | Admitting: Vascular Surgery

## 2022-07-11 ENCOUNTER — Encounter (HOSPITAL_COMMUNITY): Payer: Self-pay

## 2022-07-11 ENCOUNTER — Other Ambulatory Visit: Payer: Self-pay

## 2022-07-11 VITALS — BP 140/76 | HR 81 | Temp 97.6°F | Resp 17 | Ht 69.0 in | Wt 139.6 lb

## 2022-07-11 DIAGNOSIS — Z01812 Encounter for preprocedural laboratory examination: Secondary | ICD-10-CM | POA: Insufficient documentation

## 2022-07-11 DIAGNOSIS — Z01818 Encounter for other preprocedural examination: Secondary | ICD-10-CM

## 2022-07-11 DIAGNOSIS — I739 Peripheral vascular disease, unspecified: Secondary | ICD-10-CM | POA: Diagnosis not present

## 2022-07-11 LAB — PROTIME-INR
INR: 1 (ref 0.8–1.2)
Prothrombin Time: 13.1 seconds (ref 11.4–15.2)

## 2022-07-11 LAB — CBC
HCT: 43.8 % (ref 39.0–52.0)
Hemoglobin: 15.2 g/dL (ref 13.0–17.0)
MCH: 33.3 pg (ref 26.0–34.0)
MCHC: 34.7 g/dL (ref 30.0–36.0)
MCV: 96.1 fL (ref 80.0–100.0)
Platelets: 204 10*3/uL (ref 150–400)
RBC: 4.56 MIL/uL (ref 4.22–5.81)
RDW: 12.4 % (ref 11.5–15.5)
WBC: 8.9 10*3/uL (ref 4.0–10.5)
nRBC: 0 % (ref 0.0–0.2)

## 2022-07-11 LAB — COMPREHENSIVE METABOLIC PANEL
ALT: 14 U/L (ref 0–44)
AST: 19 U/L (ref 15–41)
Albumin: 4.3 g/dL (ref 3.5–5.0)
Alkaline Phosphatase: 45 U/L (ref 38–126)
Anion gap: 11 (ref 5–15)
BUN: 5 mg/dL — ABNORMAL LOW (ref 8–23)
CO2: 27 mmol/L (ref 22–32)
Calcium: 9.5 mg/dL (ref 8.9–10.3)
Chloride: 99 mmol/L (ref 98–111)
Creatinine, Ser: 0.74 mg/dL (ref 0.61–1.24)
GFR, Estimated: >60 mL/min (ref >60)
Glucose, Bld: 122 mg/dL — ABNORMAL HIGH (ref 70–99)
Potassium: 4.2 mmol/L (ref 3.5–5.1)
Sodium: 137 mmol/L (ref 135–145)
Total Bilirubin: 0.7 mg/dL (ref 0.3–1.2)
Total Protein: 7.1 g/dL (ref 6.5–8.1)

## 2022-07-11 LAB — URINALYSIS, ROUTINE W REFLEX MICROSCOPIC
Bacteria, UA: NONE SEEN
Bilirubin Urine: NEGATIVE
Glucose, UA: NEGATIVE mg/dL
Ketones, ur: NEGATIVE mg/dL
Leukocytes,Ua: NEGATIVE
Nitrite: NEGATIVE
Protein, ur: NEGATIVE mg/dL
Specific Gravity, Urine: 1.003 — ABNORMAL LOW (ref 1.005–1.030)
pH: 6 (ref 5.0–8.0)

## 2022-07-11 LAB — TYPE AND SCREEN
ABO/RH(D): A POS
Antibody Screen: NEGATIVE

## 2022-07-11 LAB — SURGICAL PCR SCREEN
MRSA, PCR: NEGATIVE
Staphylococcus aureus: NEGATIVE

## 2022-07-11 LAB — APTT: aPTT: 29 seconds (ref 24–36)

## 2022-07-11 NOTE — Progress Notes (Signed)
PCP - Hillard Danker Cardiologist - Denies  PPM/ICD - Denies  Chest x-ray - NI EKG - 06/27/22 Stress Test - Denies ECHO - Denies Cardiac Cath - Denies  Sleep Study - Denies  DM - Denies  Blood Thinner Instructions: Per patient plavix and Pletal instructed to take last dose on 07/10/22. Aspirin Instructions:Patient requested to follow up with instruction on stopping if needed.   Anesthesia review: No  Patient denies shortness of breath, fever, cough and chest pain at PAT appointment   All instructions explained to the patient, with a verbal understanding of the material. Patient agrees to go over the instructions while at home for a better understanding. The opportunity to ask questions was provided.

## 2022-07-18 ENCOUNTER — Encounter (HOSPITAL_COMMUNITY)
Admission: RE | Disposition: A | Discharge status: Home / Self Care | Payer: Self-pay | Source: Home / Self Care | Attending: Vascular Surgery

## 2022-07-18 ENCOUNTER — Inpatient Hospital Stay (HOSPITAL_COMMUNITY): Payer: BLUE CROSS/BLUE SHIELD | Admitting: Anesthesiology

## 2022-07-18 ENCOUNTER — Other Ambulatory Visit: Payer: Self-pay

## 2022-07-18 ENCOUNTER — Encounter (HOSPITAL_COMMUNITY): Payer: Self-pay | Admitting: Vascular Surgery

## 2022-07-18 ENCOUNTER — Inpatient Hospital Stay (HOSPITAL_COMMUNITY)
Admission: RE | Admit: 2022-07-18 | Discharge: 2022-07-19 | DRG: 272 | Disposition: A | Discharge status: Home / Self Care | Payer: BLUE CROSS/BLUE SHIELD | Attending: Vascular Surgery | Admitting: Vascular Surgery

## 2022-07-18 DIAGNOSIS — E785 Hyperlipidemia, unspecified: Secondary | ICD-10-CM | POA: Diagnosis present

## 2022-07-18 DIAGNOSIS — Z79899 Other long term (current) drug therapy: Secondary | ICD-10-CM | POA: Diagnosis not present

## 2022-07-18 DIAGNOSIS — F17210 Nicotine dependence, cigarettes, uncomplicated: Secondary | ICD-10-CM | POA: Diagnosis present

## 2022-07-18 DIAGNOSIS — Z8249 Family history of ischemic heart disease and other diseases of the circulatory system: Secondary | ICD-10-CM | POA: Diagnosis not present

## 2022-07-18 DIAGNOSIS — E7800 Pure hypercholesterolemia, unspecified: Secondary | ICD-10-CM | POA: Diagnosis present

## 2022-07-18 DIAGNOSIS — I739 Peripheral vascular disease, unspecified: Secondary | ICD-10-CM | POA: Diagnosis present

## 2022-07-18 DIAGNOSIS — J449 Chronic obstructive pulmonary disease, unspecified: Secondary | ICD-10-CM | POA: Diagnosis present

## 2022-07-18 DIAGNOSIS — I998 Other disorder of circulatory system: Secondary | ICD-10-CM | POA: Diagnosis not present

## 2022-07-18 DIAGNOSIS — Z7902 Long term (current) use of antithrombotics/antiplatelets: Secondary | ICD-10-CM

## 2022-07-18 DIAGNOSIS — I70222 Atherosclerosis of native arteries of extremities with rest pain, left leg: Secondary | ICD-10-CM | POA: Diagnosis present

## 2022-07-18 DIAGNOSIS — Z801 Family history of malignant neoplasm of trachea, bronchus and lung: Secondary | ICD-10-CM

## 2022-07-18 DIAGNOSIS — Z95820 Peripheral vascular angioplasty status with implants and grafts: Secondary | ICD-10-CM

## 2022-07-18 DIAGNOSIS — Z833 Family history of diabetes mellitus: Secondary | ICD-10-CM

## 2022-07-18 HISTORY — PX: PATCH ANGIOPLASTY: SHX6230

## 2022-07-18 HISTORY — PX: ENDARTERECTOMY FEMORAL: SHX5804

## 2022-07-18 LAB — CBC
HCT: 40.3 % (ref 39.0–52.0)
Hemoglobin: 14.4 g/dL (ref 13.0–17.0)
MCH: 34 pg (ref 26.0–34.0)
MCHC: 35.7 g/dL (ref 30.0–36.0)
MCV: 95.3 fL (ref 80.0–100.0)
Platelets: 135 10*3/uL — ABNORMAL LOW (ref 150–400)
RBC: 4.23 MIL/uL (ref 4.22–5.81)
RDW: 12.8 % (ref 11.5–15.5)
WBC: 7.8 10*3/uL (ref 4.0–10.5)
nRBC: 0 % (ref 0.0–0.2)

## 2022-07-18 LAB — CREATININE, SERUM
Creatinine, Ser: 0.69 mg/dL (ref 0.61–1.24)
GFR, Estimated: >60 mL/min (ref >60)

## 2022-07-18 LAB — ABO/RH: ABO/RH(D): A POS

## 2022-07-18 SURGERY — ENDARTERECTOMY, FEMORAL
Anesthesia: General | Site: Groin | Laterality: Left

## 2022-07-18 MED ORDER — CHLORHEXIDINE GLUCONATE 0.12 % MT SOLN: 15.0000 mL | Freq: Once | OROMUCOSAL | Status: AC

## 2022-07-18 MED ORDER — CHLORHEXIDINE GLUCONATE 0.12 % MT SOLN
OROMUCOSAL | Status: AC
  Administered 2022-07-18: 15 mL via OROMUCOSAL
  Filled 2022-07-18: qty 15

## 2022-07-18 MED ORDER — PRAVASTATIN SODIUM 40 MG PO TABS
40.0000 mg | ORAL_TABLET | Freq: Every day | ORAL | Status: DC
  Administered 2022-07-18: 40 mg via ORAL
  Filled 2022-07-18: qty 1

## 2022-07-18 MED ORDER — VANCOMYCIN HCL IN DEXTROSE 1-5 GM/200ML-% IV SOLN
INTRAVENOUS | Status: AC
  Administered 2022-07-18: 1000 mg via INTRAVENOUS
  Filled 2022-07-18: qty 200

## 2022-07-18 MED ORDER — ONDANSETRON HCL 4 MG/2ML IJ SOLN
INTRAMUSCULAR | Status: AC
  Filled 2022-07-18: qty 2

## 2022-07-18 MED ORDER — LIDOCAINE 2% (20 MG/ML) 5 ML SYRINGE
INTRAMUSCULAR | Status: AC
  Filled 2022-07-18: qty 5

## 2022-07-18 MED ORDER — ACETAMINOPHEN 325 MG PO TABS: 325.0000 mg | ORAL_TABLET | ORAL | Status: DC | PRN

## 2022-07-18 MED ORDER — ROCURONIUM BROMIDE 10 MG/ML (PF) SYRINGE
PREFILLED_SYRINGE | INTRAVENOUS | Status: AC
  Filled 2022-07-18: qty 10

## 2022-07-18 MED ORDER — ONDANSETRON HCL 4 MG/2ML IJ SOLN: 4.0000 mg | Freq: Once | INTRAMUSCULAR | Status: DC | PRN

## 2022-07-18 MED ORDER — FENTANYL CITRATE (PF) 250 MCG/5ML IJ SOLN
INTRAMUSCULAR | Status: DC | PRN
  Administered 2022-07-18 (×2): 25 ug via INTRAVENOUS
  Administered 2022-07-18 (×2): 50 ug via INTRAVENOUS
  Administered 2022-07-18 (×2): 25 ug via INTRAVENOUS

## 2022-07-18 MED ORDER — ACETAMINOPHEN 325 MG RE SUPP: 325.0000 mg | RECTAL | Status: DC | PRN

## 2022-07-18 MED ORDER — ASPIRIN 81 MG PO TBEC
81.0000 mg | DELAYED_RELEASE_TABLET | Freq: Every day | ORAL | Status: DC
  Administered 2022-07-18 – 2022-07-19 (×2): 81 mg via ORAL
  Filled 2022-07-18 (×2): qty 1

## 2022-07-18 MED ORDER — CHLORHEXIDINE GLUCONATE CLOTH 2 % EX PADS: 6.0000 | MEDICATED_PAD | Freq: Once | CUTANEOUS | Status: DC

## 2022-07-18 MED ORDER — ACETAMINOPHEN 10 MG/ML IV SOLN
INTRAVENOUS | Status: AC
  Filled 2022-07-18: qty 100

## 2022-07-18 MED ORDER — METOPROLOL TARTRATE 5 MG/5ML IV SOLN: 2.0000 mg | INTRAVENOUS | Status: DC | PRN

## 2022-07-18 MED ORDER — POTASSIUM CHLORIDE CRYS ER 20 MEQ PO TBCR: 20.0000 meq | EXTENDED_RELEASE_TABLET | Freq: Every day | ORAL | Status: DC | PRN

## 2022-07-18 MED ORDER — PHENOL 1.4 % MT LIQD: 1.0000 | OROMUCOSAL | Status: DC | PRN

## 2022-07-18 MED ORDER — DEXAMETHASONE SODIUM PHOSPHATE 10 MG/ML IJ SOLN
INTRAMUSCULAR | Status: DC | PRN
  Administered 2022-07-18: 10 mg via INTRAVENOUS

## 2022-07-18 MED ORDER — POLYVINYL ALCOHOL 1.4 % OP SOLN: 1.0000 [drp] | OPHTHALMIC | Status: DC | PRN

## 2022-07-18 MED ORDER — FENTANYL CITRATE (PF) 100 MCG/2ML IJ SOLN: 25.0000 ug | INTRAMUSCULAR | Status: DC | PRN

## 2022-07-18 MED ORDER — PANTOPRAZOLE SODIUM 40 MG PO TBEC
40.0000 mg | DELAYED_RELEASE_TABLET | Freq: Every day | ORAL | Status: DC
  Administered 2022-07-18 – 2022-07-19 (×2): 40 mg via ORAL
  Filled 2022-07-18 (×2): qty 1

## 2022-07-18 MED ORDER — FENTANYL CITRATE (PF) 250 MCG/5ML IJ SOLN
INTRAMUSCULAR | Status: AC
  Filled 2022-07-18: qty 5

## 2022-07-18 MED ORDER — LABETALOL HCL 5 MG/ML IV SOLN: 10.0000 mg | INTRAVENOUS | Status: DC | PRN

## 2022-07-18 MED ORDER — ORAL CARE MOUTH RINSE: 15.0000 mL | Freq: Once | OROMUCOSAL | Status: AC

## 2022-07-18 MED ORDER — OXYCODONE-ACETAMINOPHEN 5-325 MG PO TABS: 1.0000 | ORAL_TABLET | ORAL | Status: DC | PRN

## 2022-07-18 MED ORDER — HYDRALAZINE HCL 20 MG/ML IJ SOLN: 5.0000 mg | INTRAMUSCULAR | Status: DC | PRN

## 2022-07-18 MED ORDER — PROPOFOL 10 MG/ML IV BOLUS
INTRAVENOUS | Status: DC | PRN
  Administered 2022-07-18: 120 mg via INTRAVENOUS
  Administered 2022-07-18: 30 mg via INTRAVENOUS

## 2022-07-18 MED ORDER — ROCURONIUM BROMIDE 10 MG/ML (PF) SYRINGE
PREFILLED_SYRINGE | INTRAVENOUS | Status: DC | PRN
  Administered 2022-07-18: 20 mg via INTRAVENOUS
  Administered 2022-07-18: 40 mg via INTRAVENOUS
  Administered 2022-07-18: 60 mg via INTRAVENOUS
  Administered 2022-07-18: 20 mg via INTRAVENOUS

## 2022-07-18 MED ORDER — BISACODYL 10 MG RE SUPP: 10.0000 mg | Freq: Every day | RECTAL | Status: DC | PRN

## 2022-07-18 MED ORDER — PROTAMINE SULFATE 10 MG/ML IV SOLN
INTRAVENOUS | Status: DC | PRN
  Administered 2022-07-18: 10 mg via INTRAVENOUS
  Administered 2022-07-18: 30 mg via INTRAVENOUS

## 2022-07-18 MED ORDER — SODIUM CHLORIDE 0.9 % IV SOLN: 500.0000 mL | Freq: Once | INTRAVENOUS | Status: DC | PRN

## 2022-07-18 MED ORDER — HEPARIN 6000 UNIT IRRIGATION SOLUTION
Status: DC | PRN
  Administered 2022-07-18: 1

## 2022-07-18 MED ORDER — OXYCODONE HCL 5 MG PO TABS: 5.0000 mg | ORAL_TABLET | Freq: Once | ORAL | Status: DC | PRN

## 2022-07-18 MED ORDER — DEXAMETHASONE SODIUM PHOSPHATE 10 MG/ML IJ SOLN
INTRAMUSCULAR | Status: AC
  Filled 2022-07-18: qty 1

## 2022-07-18 MED ORDER — CILOSTAZOL 100 MG PO TABS
100.0000 mg | ORAL_TABLET | Freq: Every day | ORAL | Status: DC
  Administered 2022-07-18 – 2022-07-19 (×2): 100 mg via ORAL
  Filled 2022-07-18 (×2): qty 1

## 2022-07-18 MED ORDER — PHENYLEPHRINE HCL-NACL 20-0.9 MG/250ML-% IV SOLN
INTRAVENOUS | Status: DC | PRN
  Administered 2022-07-18: 10 ug/min via INTRAVENOUS

## 2022-07-18 MED ORDER — POLYETHYLENE GLYCOL 3350 17 G PO PACK: 17.0000 g | PACK | Freq: Every day | ORAL | Status: DC | PRN

## 2022-07-18 MED ORDER — DOCUSATE SODIUM 100 MG PO CAPS
100.0000 mg | ORAL_CAPSULE | Freq: Every day | ORAL | Status: DC
  Administered 2022-07-19: 100 mg via ORAL
  Filled 2022-07-18: qty 1

## 2022-07-18 MED ORDER — THROMBIN (RECOMBINANT) 20000 UNITS EX SOLR
CUTANEOUS | Status: AC
  Filled 2022-07-18: qty 20000

## 2022-07-18 MED ORDER — ALBUTEROL SULFATE (2.5 MG/3ML) 0.083% IN NEBU: 2.5000 mg | INHALATION_SOLUTION | Freq: Four times a day (QID) | RESPIRATORY_TRACT | Status: DC | PRN

## 2022-07-18 MED ORDER — ONDANSETRON HCL 4 MG/2ML IJ SOLN: 4.0000 mg | Freq: Four times a day (QID) | INTRAMUSCULAR | Status: DC | PRN

## 2022-07-18 MED ORDER — SODIUM CHLORIDE 0.9 % IV SOLN: INTRAVENOUS | Status: DC

## 2022-07-18 MED ORDER — LACTATED RINGERS IV SOLN: INTRAVENOUS | Status: DC

## 2022-07-18 MED ORDER — HEPARIN 6000 UNIT IRRIGATION SOLUTION
Status: AC
  Filled 2022-07-18: qty 500

## 2022-07-18 MED ORDER — KETOROLAC TROMETHAMINE 30 MG/ML IJ SOLN: 30.0000 mg | Freq: Once | INTRAMUSCULAR | Status: DC | PRN

## 2022-07-18 MED ORDER — MORPHINE SULFATE (PF) 2 MG/ML IV SOLN: 2.0000 mg | INTRAVENOUS | Status: DC | PRN

## 2022-07-18 MED ORDER — CLOPIDOGREL BISULFATE 75 MG PO TABS
75.0000 mg | ORAL_TABLET | Freq: Every day | ORAL | Status: DC
  Administered 2022-07-18 – 2022-07-19 (×2): 75 mg via ORAL
  Filled 2022-07-18 (×2): qty 1

## 2022-07-18 MED ORDER — MAGNESIUM SULFATE 2 GM/50ML IV SOLN: 2.0000 g | Freq: Every day | INTRAVENOUS | Status: DC | PRN

## 2022-07-18 MED ORDER — ALBUTEROL SULFATE HFA 108 (90 BASE) MCG/ACT IN AERS: 1.0000 | INHALATION_SPRAY | Freq: Four times a day (QID) | RESPIRATORY_TRACT | Status: DC | PRN

## 2022-07-18 MED ORDER — HEPARIN SODIUM (PORCINE) 5000 UNIT/ML IJ SOLN
5000.0000 [IU] | Freq: Three times a day (TID) | INTRAMUSCULAR | Status: DC
  Administered 2022-07-19: 5000 [IU] via SUBCUTANEOUS
  Filled 2022-07-18: qty 1

## 2022-07-18 MED ORDER — VANCOMYCIN HCL IN DEXTROSE 1-5 GM/200ML-% IV SOLN: 1000.0000 mg | INTRAVENOUS | Status: AC

## 2022-07-18 MED ORDER — PROPOFOL 10 MG/ML IV BOLUS
INTRAVENOUS | Status: AC
  Filled 2022-07-18: qty 20

## 2022-07-18 MED ORDER — ACETAMINOPHEN 10 MG/ML IV SOLN
INTRAVENOUS | Status: DC | PRN
  Administered 2022-07-18: 1000 mg via INTRAVENOUS

## 2022-07-18 MED ORDER — ONDANSETRON HCL 4 MG/2ML IJ SOLN
INTRAMUSCULAR | Status: DC | PRN
  Administered 2022-07-18: 4 mg via INTRAVENOUS

## 2022-07-18 MED ORDER — LIDOCAINE 2% (20 MG/ML) 5 ML SYRINGE
INTRAMUSCULAR | Status: DC | PRN
  Administered 2022-07-18: 100 mg via INTRAVENOUS

## 2022-07-18 MED ORDER — ALUM & MAG HYDROXIDE-SIMETH 200-200-20 MG/5ML PO SUSP: 15.0000 mL | ORAL | Status: DC | PRN

## 2022-07-18 MED ORDER — HEPARIN SODIUM (PORCINE) 1000 UNIT/ML IJ SOLN
INTRAMUSCULAR | Status: DC | PRN
  Administered 2022-07-18: 6000 [IU] via INTRAVENOUS
  Administered 2022-07-18: 2000 [IU] via INTRAVENOUS

## 2022-07-18 MED ORDER — OXYCODONE HCL 5 MG/5ML PO SOLN: 5.0000 mg | Freq: Once | ORAL | Status: DC | PRN

## 2022-07-18 MED ORDER — GUAIFENESIN-DM 100-10 MG/5ML PO SYRP: 15.0000 mL | ORAL_SOLUTION | ORAL | Status: DC | PRN

## 2022-07-18 MED ORDER — 0.9 % SODIUM CHLORIDE (POUR BTL) OPTIME
TOPICAL | Status: DC | PRN
  Administered 2022-07-18: 2000 mL

## 2022-07-18 MED ORDER — SUGAMMADEX SODIUM 200 MG/2ML IV SOLN
INTRAVENOUS | Status: DC | PRN
  Administered 2022-07-18: 122.4 mg via INTRAVENOUS

## 2022-07-18 SURGICAL SUPPLY — 46 items
ADH SKN CLS APL DERMABOND .7 (GAUZE/BANDAGES/DRESSINGS) ×2
ADH SKN CLS LQ APL DERMABOND (GAUZE/BANDAGES/DRESSINGS) ×2
BAG COUNTER SPONGE SURGICOUNT (BAG) ×2 IMPLANT
BAG SPNG CNTER NS LX DISP (BAG) ×2
CANISTER SUCT 3000ML PPV (MISCELLANEOUS) ×2 IMPLANT
CANNULA VESSEL 3MM 2 BLNT TIP (CANNULA) ×3 IMPLANT
CLIP VESOCCLUDE MED 24/CT (CLIP) ×2 IMPLANT
CLIP VESOCCLUDE SM WIDE 24/CT (CLIP) ×2 IMPLANT
COVER PROBE W GEL 5X96 (DRAPES) ×1 IMPLANT
DERMABOND ADVANCED .7 DNX12 (GAUZE/BANDAGES/DRESSINGS) ×2 IMPLANT
DERMABOND ADVANCED .7 DNX6 (GAUZE/BANDAGES/DRESSINGS) ×1 IMPLANT
DRAIN CHANNEL 15F RND FF W/TCR (WOUND CARE) IMPLANT
ELECT REM PT RETURN 9FT ADLT (ELECTROSURGICAL) ×2
ELECTRODE REM PT RTRN 9FT ADLT (ELECTROSURGICAL) ×2 IMPLANT
EVACUATOR SILICONE 100CC (DRAIN) IMPLANT
GLOVE BIO SURGEON STRL SZ7.5 (GLOVE) ×2 IMPLANT
GLOVE BIOGEL PI IND STRL 8 (GLOVE) ×2 IMPLANT
GLOVE SURG POLY ORTHO LF SZ7.5 (GLOVE) IMPLANT
GLOVE SURG UNDER LTX SZ8 (GLOVE) ×2 IMPLANT
GOWN STRL REUS W/ TWL LRG LVL3 (GOWN DISPOSABLE) ×6 IMPLANT
GOWN STRL REUS W/TWL LRG LVL3 (GOWN DISPOSABLE) ×6
GRAFT VASC PATCH XENOSURE 1X14 (Vascular Products) ×1 IMPLANT
KIT BASIN OR (CUSTOM PROCEDURE TRAY) ×2 IMPLANT
KIT TURNOVER KIT B (KITS) ×2 IMPLANT
NS IRRIG 1000ML POUR BTL (IV SOLUTION) ×4 IMPLANT
PACK PERIPHERAL VASCULAR (CUSTOM PROCEDURE TRAY) ×2 IMPLANT
PAD ARMBOARD 7.5X6 YLW CONV (MISCELLANEOUS) ×4 IMPLANT
SET WALTER ACTIVATION W/DRAPE (SET/KITS/TRAYS/PACK) ×2 IMPLANT
SPONGE INTESTINAL PEANUT (DISPOSABLE) ×1 IMPLANT
SPONGE SURGIFOAM ABS GEL 100 (HEMOSTASIS) IMPLANT
STAPLER VISISTAT (STAPLE) IMPLANT
SUT ETHIBOND 5 LR DA (SUTURE) IMPLANT
SUT MNCRL AB 4-0 PS2 18 (SUTURE) ×3 IMPLANT
SUT PROLENE 5 0 C 1 24 (SUTURE) ×3 IMPLANT
SUT PROLENE 5 0 C 1 36 (SUTURE) ×1 IMPLANT
SUT PROLENE 6 0 BV (SUTURE) ×7 IMPLANT
SUT SILK 3 0 (SUTURE) ×2
SUT SILK 3-0 18XBRD TIE 12 (SUTURE) ×1 IMPLANT
SUT VIC AB 2-0 CTB1 (SUTURE) ×2 IMPLANT
SUT VIC AB 3-0 SH 27 (SUTURE) ×6
SUT VIC AB 3-0 SH 27X BRD (SUTURE) ×4 IMPLANT
TOWEL GREEN STERILE (TOWEL DISPOSABLE) ×2 IMPLANT
TRAY FOLEY MTR SLVR 16FR STAT (SET/KITS/TRAYS/PACK) ×2 IMPLANT
UNDERPAD 30X36 HEAVY ABSORB (UNDERPADS AND DIAPERS) ×2 IMPLANT
WARMER LAPAROSCOPE (MISCELLANEOUS) ×1 IMPLANT
WATER STERILE IRR 1000ML POUR (IV SOLUTION) ×2 IMPLANT

## 2022-07-18 NOTE — Progress Notes (Signed)
Pt arrived from ..pacu..., A/ox .4..pt denies any pain, MD aware,CCMD called. CHG bath given,no further needs at this time   

## 2022-07-18 NOTE — Anesthesia Postprocedure Evaluation (Signed)
Anesthesia Post Note  Patient: Eddie Chan  Procedure(s) Performed: LEFT COMMON FEMORAL ENDARTERECTOMY (Left: Groin) PATCH ANGIOPLASTY OF LEFT FEMORAL ARTERY USING BOVINE PERICARDIUM PATCH (Left: Groin)     Patient location during evaluation: PACU Anesthesia Type: General Level of consciousness: awake and alert Pain management: pain level controlled Vital Signs Assessment: post-procedure vital signs reviewed and stable Respiratory status: spontaneous breathing, nonlabored ventilation, respiratory function stable and patient connected to nasal cannula oxygen Cardiovascular status: blood pressure returned to baseline and stable Postop Assessment: no apparent nausea or vomiting Anesthetic complications: no   No notable events documented.  Last Vitals:  Vitals:   07/18/22 1345 07/18/22 1400  BP: (!) 157/84 (!) 157/83  Pulse: 100 96  Resp: 15 16  Temp:    SpO2: 96% 94%    Last Pain:  Vitals:   07/18/22 1345  TempSrc:   PainSc: 0-No pain                 Jesslyn Viglione S

## 2022-07-18 NOTE — Anesthesia Preprocedure Evaluation (Signed)
Anesthesia Evaluation  Patient identified by MRN, date of birth, ID band Patient awake    Reviewed: Allergy & Precautions, NPO status , Patient's Chart, lab work & pertinent test results  Airway Mallampati: II  TM Distance: >3 FB Neck ROM: Full    Dental no notable dental hx.    Pulmonary COPD,  COPD inhaler, Current Smoker and Patient abstained from smoking.,    Pulmonary exam normal breath sounds clear to auscultation       Cardiovascular + Peripheral Vascular Disease  Normal cardiovascular exam Rhythm:Regular Rate:Normal     Neuro/Psych negative neurological ROS  negative psych ROS   GI/Hepatic negative GI ROS, Neg liver ROS,   Endo/Other  negative endocrine ROS  Renal/GU negative Renal ROS  negative genitourinary   Musculoskeletal negative musculoskeletal ROS (+)   Abdominal   Peds negative pediatric ROS (+)  Hematology negative hematology ROS (+)   Anesthesia Other Findings   Reproductive/Obstetrics negative OB ROS                             Anesthesia Physical Anesthesia Plan  ASA: 3  Anesthesia Plan: General   Post-op Pain Management: Ofirmev IV (intra-op)*   Induction: Intravenous  PONV Risk Score and Plan: 1 and Ondansetron, Dexamethasone and Treatment may vary due to age or medical condition  Airway Management Planned: Oral ETT  Additional Equipment:   Intra-op Plan:   Post-operative Plan: Extubation in OR  Informed Consent: I have reviewed the patients History and Physical, chart, labs and discussed the procedure including the risks, benefits and alternatives for the proposed anesthesia with the patient or authorized representative who has indicated his/her understanding and acceptance.     Dental advisory given  Plan Discussed with: CRNA and Surgeon  Anesthesia Plan Comments:         Anesthesia Quick Evaluation

## 2022-07-18 NOTE — Progress Notes (Signed)
VASCULAR SURGERY:  This patient presents with disabling claudication of the right lower extremity.  We discussed conservative treatment including tobacco cessation and a structured walking program.  He felt that he could not tolerate his symptoms.  He underwent an arteriogram which showed disease in the common femoral and proximal superficial femoral artery in addition to a stenosis just above the knee.  After reviewing the films with multiple partners I think that the safest approach is to address the stenosis in the common femoral artery with vein patch angioplasty and hold off on popliteal angioplasty given the proximity to the knee joint.  If his symptoms do not improve significantly we can take him back to the Cath Lab for possible atherectomy of the popliteal stenosis possible drug-coated balloon angioplasty.  I have discussed this plan with the patient and his wife and they are agreeable to proceed.  Gae Gallop, MD 9:14 AM

## 2022-07-18 NOTE — Discharge Instructions (Signed)
 [facility]  Discharge instructions  Lower Extremity Bypass Surgery  Please refer to the following instruction for your post-procedure care. Your surgeon or physician assistant will discuss any changes with you.  Activity  You are encouraged to walk as much as you can. You can slowly return to normal activities during the month after your surgery. Avoid strenuous activity and heavy lifting until your doctor tells you it's OK. Avoid activities such as vacuuming or swinging a golf club. Do not drive until your doctor give the OK and you are no longer taking prescription pain medications. It is also normal to have difficulty with sleep habits, eating and bowel movement after surgery. These will go away with time.  Bathing/Showering  Shower daily after you go home. Do not soak in a bathtub, hot tub, or swim until the incision heals completely.  Incision Care  Clean your incision with mild soap and water. Shower every day. Pat the area dry with a clean towel. You do not need a bandage unless otherwise instructed. Do not apply any ointments or creams to your incision. If you have open wounds you will be instructed how to care for them or a visiting nurse may be arranged for you. If you have staples or sutures along your incision they will be removed at your post-op appointment. You may have skin glue on your incision. Do not peel it off. It will come off on its own in about one week.  Wash the groin wound with soap and water daily and pat dry. (No tub bath-only shower)  Then put a dry gauze or washcloth in the groin to keep this area dry to help prevent wound infection.  Do this daily and as needed.  Do not use Vaseline or neosporin on your incisions.  Only use soap and water on your incisions and then protect and keep dry.  Diet  Resume your normal diet. There are no special food restrictions following this procedure. A low fat/ low cholesterol diet is  recommended for all patients with vascular disease. In order to heal from your surgery, it is CRITICAL to get adequate nutrition. Your body requires vitamins, minerals, and protein. Vegetables are the best source of vitamins and minerals. Vegetables also provide the perfect balance of protein. Processed food has little nutritional value, so try to avoid this.  Medications  Resume taking all your medications unless your doctor or physician assistant tells you not to. If your incision is causing pain, you may take over-the-counter pain relievers such as acetaminophen (Tylenol). If you were prescribed a stronger pain medication, please aware these medication can cause nausea and constipation. Prevent nausea by taking the medication with a snack or meal. Avoid constipation by drinking plenty of fluids and eating foods with high amount of fiber, such as fruits, vegetables, and grains. Take Colace 100 mg (an over-the-counter stool softener) twice a day as needed for constipation.  Do not take Tylenol if you are taking prescription pain medications.  Follow Up  Our office will schedule a follow up appointment 2-3 weeks following discharge.  Please call us immediately for any of the following conditions  Severe or worsening pain in your legs or feet while at rest or while walking Increase pain, redness, warmth, or drainage (pus) from your incision site(s) Fever of 101 degree or higher The swelling in your leg with the bypass suddenly worsens and becomes more painful than when you were in the hospital If you have   been instructed to feel your graft pulse then you should do so every day. If you can no longer feel this pulse, call the office immediately. Not all patients are given this instruction.  Leg swelling is common after leg bypass surgery.  The swelling should improve over a few months following surgery. To improve the swelling, you may elevate your legs above the level of your heart while you are  sitting or resting. Your surgeon or physician assistant may ask you to apply an ACE wrap or wear compression (TED) stockings to help to reduce swelling.  Reduce your risk of vascular disease  Stop smoking. If you would like help call [facility] at 1-800-QUIT-NOW (1-800-784-8669) or [facility] at 336-586-4000.  Manage your cholesterol Maintain a desired weight Control your diabetes weight Control your diabetes Keep your blood pressure down  If you have any questions, please call the office at [phone]  

## 2022-07-18 NOTE — Anesthesia Procedure Notes (Signed)
Procedure Name: Intubation Date/Time: 07/18/2022 9:44 AM  Performed by: Maude Leriche, CRNAPre-anesthesia Checklist: Patient identified, Emergency Drugs available, Suction available and Patient being monitored Patient Re-evaluated:Patient Re-evaluated prior to induction Oxygen Delivery Method: Circle system utilized Preoxygenation: Pre-oxygenation with 100% oxygen Induction Type: IV induction Ventilation: Mask ventilation without difficulty Laryngoscope Size: Miller and 2 Grade View: Grade I Tube type: Oral Tube size: 7.5 mm Number of attempts: 1 Placement Confirmation: ETT inserted through vocal cords under direct vision, positive ETCO2 and breath sounds checked- equal and bilateral Secured at: 23 cm Tube secured with: Tape Dental Injury: Teeth and Oropharynx as per pre-operative assessment

## 2022-07-18 NOTE — Transfer of Care (Signed)
Immediate Anesthesia Transfer of Care Note  Patient: [facility] BLOODWORTH  Procedure(s) Performed: LEFT COMMON FEMORAL ENDARTERECTOMY (Left: Groin) PATCH ANGIOPLASTY OF LEFT FEMORAL ARTERY USING BOVINE PERICARDIUM PATCH (Left: Groin)  Patient Location: PACU  Anesthesia Type:General  Level of Consciousness: awake, alert  and oriented  Airway & Oxygen Therapy: Patient Spontanous Breathing and Patient connected to nasal cannula oxygen  Post-op Assessment: Report given to RN, Post -op Vital signs reviewed and stable, Patient moving all extremities X 4 and Patient able to stick tongue midline  Post vital signs: Reviewed  Last Vitals:  Vitals Value Taken Time  BP 152/90 07/18/22 1313  Temp 97.6 07/18/22 1313  Pulse 97 07/18/22 1312  Resp 17 07/18/22 1312  SpO2 95 % 07/18/22 1312  Vitals shown include unvalidated device data.  Last Pain:  Vitals:   07/18/22 0756  TempSrc:   PainSc: 0-No pain      Patients Stated Pain Goal: 2 (49/17/91 5056)  Complications: No notable events documented.

## 2022-07-18 NOTE — Progress Notes (Signed)
   VASCULAR SURGERY POSTOP:   Doing well postop.  His incision looks fine and he has brisk Doppler signals in the left foot.    SUBJECTIVE:   Pain well controlled.  PHYSICAL EXAM:   Vitals:   07/18/22 1425 07/18/22 1444 07/18/22 1445 07/18/22 1500  BP:  (!) 152/93 135/83 (!) 154/92  Pulse: 97 89 90 93  Resp: 18 15 16 19   Temp: 98.2 F (36.8 C) 98 F (36.7 C)    TempSrc:  Oral    SpO2: 95% 96% 95% 97%  Weight:      Height:       Risk anterior tibial and posterior tibial signal with the Doppler. His incision looks fine.  LABS:   Lab Results  Component Value Date   WBC 7.8 07/18/2022   HGB 14.4 07/18/2022   HCT 40.3 07/18/2022   MCV 95.3 07/18/2022   PLT 135 (L) 07/18/2022   Lab Results  Component Value Date   CREATININE 0.69 07/18/2022   Lab Results  Component Value Date   INR 1.0 07/11/2022   CBG (last 3)  No results for input(s): "GLUCAP" in the last 72 hours.  PROBLEM LIST:    Principal Problem:   Claudication in peripheral vascular disease (HCC) Active Problems:   PAD (peripheral artery disease) (HCC)   CURRENT MEDS:    aspirin EC  81 mg Oral Daily   cilostazol  100 mg Oral Daily   clopidogrel  75 mg Oral Daily   [START ON 07/19/2022] docusate sodium  100 mg Oral Daily   [START ON 07/19/2022] heparin  5,000 Units Subcutaneous Q8H   pantoprazole  40 mg Oral Daily   pravastatin  40 mg Oral Daily    Deitra Mayo Office: [phone] 07/18/2022

## 2022-07-18 NOTE — Op Note (Signed)
NAME: Eddie Chan    MRN: [id number] DOB: 06/13/1961    DATE OF OPERATION: 07/18/2022  PREOP DIAGNOSIS:    Progressive ischemia of the left lower extremity  POSTOP DIAGNOSIS:    Same  PROCEDURE:    Extensive endarterectomy of the left external iliac artery, common femoral artery, and superficial femoral artery with bovine pericardial patch angioplasty  SURGEON: Di Kindle. Edilia Bo, MD  ASSIST: Doreatha Massed, PA  ANESTHESIA: General  EBL: 50 cc  INDICATIONS:    Eddie Chan is a 61 y.o. male who would presented with worsening pain in the left lower extremity which she states he could no longer tolerate.  We had discussed conservative measures including a structured walking program and tobacco cessation however he wished to pursue a more aggressive approach.  He is undergone previous angioplasty and stenting of the left common iliac artery and also of the left superficial femoral artery.  These are patent.  His arteriogram showed significant plaque in the common femoral artery and proximal superficial femoral artery in addition to a popliteal artery stenosis just above the level of the knee.  He presents for femoral endarterectomy.  FINDINGS:   There was extensive plaque which extended well up the external iliac artery and also well down onto the superficial femoral artery.  Approximately 20 cm of the external, common femoral, and superficial femoral arteries were endarterectomized.  A long bovine pericardial patch was sewn.  TECHNIQUE:   The patient was taken to the operative room and I did look at the left great saphenous vein myself with the SonoSite and the vein was small and thickened.  The patient received a general anesthetic.  The left leg was prepped and draped in usual sterile fashion.  A longitudinal incision was made in the left groin over the common femoral artery.  The dissection was carried down to the common femoral artery which had extensive plaque and  also some inflammatory reaction around the artery.  I controlled the external iliac artery well above the superficial epigastric branch where I could ultimately place a clamp.  This was quite high.  The deep femoral artery and superficial femoral arteries were controlled.  Plan was to extend his endarterectomy onto the superficial femoral artery but the plaque extended quite low.  I had to extend the incision quite a bit to get to an area where I could clamp.  The superficial femoral artery was mobilized and branches were controlled with silk ties.  The patient was heparinized.  A longitudinal arteriotomy was made in the common femoral artery and this was extended well up into the external iliac artery and down onto the superficial femoral artery.  An endarterectomy plane was established and an extensive endarterectomy was performed of the external iliac artery, common femoral artery, and superficial femoral artery.  Distally there was a fairly good endpoint.  There is 1 small plaque medially which was tacked.  All loose debris was removed.  A long bovine pericardial patch was then sewn using continuous 5-0 Prolene suture.  Prior to completing the patch closure the artery was backbled and flushed appropriately and the anastomosis completed.  Flow was reestablished first to the deep femoral artery and and then to the superficial femoral artery.  At the completion there was good signals in the superficial femoral artery and deep femoral artery.  There is also a good signal in the posterior tibial artery and anterior tibial artery at the foot.  Hemostasis was obtained in the  wound.  The heparin was partially reversed with protamine.  The wound was then closed with a deep layer of 2-0 Vicryl.  The subcutaneous layer was closed with 2 deep layers of 3-0 Vicryl.  The skin was closed with 4-0 Monocryl.  Dermabond was applied.  The patient tolerated the procedure well was transferred to the recovery room in stable  condition.  All needle and sponge counts were correct.  Given the complexity of the case a first assistant was necessary in order to expedient the procedure and safely perform the technical aspects of the operation.  Deitra Mayo, MD, FACS [facility]  DATE OF DICTATION:   07/18/2022

## 2022-07-19 LAB — LIPID PANEL
Cholesterol: 161 mg/dL (ref 0–200)
HDL: 73 mg/dL (ref >40)
LDL Cholesterol: 79 mg/dL (ref 0–99)
Total CHOL/HDL Ratio: 2.2 RATIO
Triglycerides: 44 mg/dL (ref <150)
VLDL: 9 mg/dL (ref 0–40)

## 2022-07-19 LAB — CBC
HCT: 40.4 % (ref 39.0–52.0)
Hemoglobin: 14.3 g/dL (ref 13.0–17.0)
MCH: 33.8 pg (ref 26.0–34.0)
MCHC: 35.4 g/dL (ref 30.0–36.0)
MCV: 95.5 fL (ref 80.0–100.0)
Platelets: 149 10*3/uL — ABNORMAL LOW (ref 150–400)
RBC: 4.23 MIL/uL (ref 4.22–5.81)
RDW: 12.6 % (ref 11.5–15.5)
WBC: 13.3 10*3/uL — ABNORMAL HIGH (ref 4.0–10.5)
nRBC: 0 % (ref 0.0–0.2)

## 2022-07-19 LAB — BASIC METABOLIC PANEL
Anion gap: 9 (ref 5–15)
BUN: 10 mg/dL (ref 8–23)
CO2: 28 mmol/L (ref 22–32)
Calcium: 9.8 mg/dL (ref 8.9–10.3)
Chloride: 101 mmol/L (ref 98–111)
Creatinine, Ser: 0.75 mg/dL (ref 0.61–1.24)
GFR, Estimated: >60 mL/min (ref >60)
Glucose, Bld: 167 mg/dL — ABNORMAL HIGH (ref 70–99)
Potassium: 4 mmol/L (ref 3.5–5.1)
Sodium: 138 mmol/L (ref 135–145)

## 2022-07-19 MED ORDER — ROSUVASTATIN CALCIUM 20 MG PO TABS: 20.0000 mg | ORAL_TABLET | Freq: Every day | ORAL | Status: DC

## 2022-07-19 MED ORDER — OXYCODONE-ACETAMINOPHEN 5-325 MG PO TABS: 1.0000 | ORAL_TABLET | Freq: Four times a day (QID) | ORAL | 0 refills | Status: DC | PRN

## 2022-07-19 MED ORDER — ROSUVASTATIN CALCIUM 20 MG PO TABS: 20.0000 mg | ORAL_TABLET | Freq: Every day | ORAL | 3 refills | Status: DC

## 2022-07-19 NOTE — Evaluation (Signed)
Physical Therapy Evaluation & Discharge Patient Details Name: Eddie Chan MRN: [id number] DOB: [birth year] Today's Date: 07/19/2022  History of Present Illness  Pt is a 61 y.o. male admitted 07/18/22 for same day extensive endarterectomy of L external iliac artery, common femoral artery, and superficial femoral artery with angioplasty. PMH includes HLD, PAD.   Clinical Impression  Patient evaluated by Physical Therapy with no further acute PT needs identified. PTA, pt independent without DME, semi-retired driving trucks, lives with wife. Today, pt moving extremely well post-op, independent with mobility and ADL tasks. All education has been completed and the patient has no further questions. Acute PT is signing off. Thank you for this referral.    HR 88 with activity   Recommendations for follow up therapy are one component of a multi-disciplinary discharge planning process, led by the attending physician.  Recommendations may be updated based on patient status, additional functional criteria and insurance authorization.  Follow Up Recommendations No PT follow up      Assistance Recommended at Discharge PRN  Patient can return home with the following  Assistance with cooking/housework    Equipment Recommendations None recommended by PT  Recommendations for Other Services   N/A   Functional Status Assessment Patient has not had a recent decline in their functional status     Precautions / Restrictions Precautions Precautions: None Restrictions Weight Bearing Restrictions: No      Mobility  Bed Mobility Overal bed mobility: Independent                  Transfers Overall transfer level: Independent Equipment used: None                    Ambulation/Gait Ambulation/Gait assistance: Independent Gait Distance (Feet): 600 Feet Assistive device: None Gait Pattern/deviations: WFL(Within Functional Limits)       General Gait Details: slow, steady gait  independent without DME; no overt instability or LOB noted  Stairs Stairs: Yes Stairs assistance: Modified independent (Device/Increase time) Stair Management: One rail Right, Alternating pattern, Forwards Number of Stairs: 4    Wheelchair Mobility    Modified Rankin (Stroke Patients Only)       Balance Overall balance assessment: Independent   Sitting balance-Leahy Scale: Normal       Standing balance-Leahy Scale: Good                   Standardized Balance Assessment Standardized Balance Assessment : Dynamic Gait Index   Dynamic Gait Index Level Surface: Normal Change in Gait Speed: Mild Impairment Gait with Horizontal Head Turns: Normal Gait with Vertical Head Turns: Normal Gait and Pivot Turn: Normal Step Over Obstacle: Mild Impairment Step Around Obstacles: Normal Steps: Mild Impairment Total Score: 21       Pertinent Vitals/Pain Pain Assessment Pain Assessment: Faces Faces Pain Scale: Hurts a little bit Pain Location: L groin incision Pain Descriptors / Indicators: Guarding Pain Intervention(s): Monitored during session    Home Living Family/patient expects to be discharged to:: Private residence Living Arrangements: Spouse/significant other;Other relatives Available Help at Discharge: Family;Available 24 hours/day Type of Home: House Home Access: Ramped entrance       Home Layout: One level Home Equipment: Grab bars - tub/shower      Prior Function Prior Level of Function : Independent/Modified Independent;Working/employed;Driving             Mobility Comments: Independent without DME; semi-retired, works driving trucks. Enjoys working on cars for fun - built his wife a  2020 Mustang ADLs Comments: Independent     Hand Dominance        Extremity/Trunk Assessment   Upper Extremity Assessment Upper Extremity Assessment: Overall WFL for tasks assessed    Lower Extremity Assessment Lower Extremity Assessment: Overall WFL  for tasks assessed (great LLE strength and ROM s/p endarterectomy with large L groin incision)    Cervical / Trunk Assessment Cervical / Trunk Assessment: Normal  Communication   Communication: No difficulties  Cognition Arousal/Alertness: Awake/alert Behavior During Therapy: WFL for tasks assessed/performed Overall Cognitive Status: Within Functional Limits for tasks assessed                                          General Comments General comments (skin integrity, edema, etc.): educ re: activity recommendations, gentle AROM/stretching, DVT prevention, edema control, importance of mobility    Exercises Other Exercises Other Exercises: standing hip flex, LAQ, supine heel slides, gentle groin/butterfly stretch   Assessment/Plan    PT Assessment Patient does not need any further PT services  PT Problem List         PT Treatment Interventions      PT Goals (Current goals can be found in the Care Plan section)  Acute Rehab PT Goals PT Goal Formulation: All assessment and education complete, DC therapy    Frequency       Co-evaluation               AM-PAC PT "6 Clicks" Mobility  Outcome Measure Help needed turning from your back to your side while in a flat bed without using bedrails?: None Help needed moving from lying on your back to sitting on the side of a flat bed without using bedrails?: None Help needed moving to and from a bed to a chair (including a wheelchair)?: None Help needed standing up from a chair using your arms (e.g., wheelchair or bedside chair)?: None Help needed to walk in hospital room?: None Help needed climbing 3-5 steps with a railing? : None 6 Click Score: 24    End of Session   Activity Tolerance: Patient tolerated treatment well Patient left: in bed;with call bell/phone within reach Nurse Communication: Mobility status PT Visit Diagnosis: Other abnormalities of gait and mobility (R26.89)    Time: 2952-8413 PT  Time Calculation (min) (ACUTE ONLY): 9 min   Charges:   PT Evaluation $PT Eval Low Complexity: 1 Low         Ina Homes, PT, DPT Acute Rehabilitation Services  Personal: Secure Chat Rehab Office: [phone]  Malachy Chamber 07/19/2022, 8:54 AM

## 2022-07-19 NOTE — Progress Notes (Addendum)
  Progress Note    07/19/2022 7:10 AM 1 Day Post-Op  Subjective:  wants to go home.  Left leg feels good.    Afebrile HR 60's-90's NSR 191'Y-782'N systolic 56% RA  Vitals:   07/19/22 0400 07/19/22 0500  BP: 127/68 (!) 148/71  Pulse: 77 90  Resp: 15 18  Temp: 98.2 F (36.8 C)   SpO2: 90% 92%    Physical Exam: Cardiac:  regular Lungs:  non labored Incisions:  left groin clean and dry; no hematoma Extremities:  brisk doppler flow left DP/PT; calf is soft and non tender   CBC    Component Value Date/Time   WBC 13.3 (H) 07/19/2022 0242   RBC 4.23 07/19/2022 0242   HGB 14.3 07/19/2022 0242   HCT 40.4 07/19/2022 0242   PLT 149 (L) 07/19/2022 0242   MCV 95.5 07/19/2022 0242   MCH 33.8 07/19/2022 0242   MCHC 35.4 07/19/2022 0242   RDW 12.6 07/19/2022 0242    BMET    Component Value Date/Time   NA 138 07/19/2022 0242   K 4.0 07/19/2022 0242   CL 101 07/19/2022 0242   CO2 28 07/19/2022 0242   GLUCOSE 167 (H) 07/19/2022 0242   BUN 10 07/19/2022 0242   CREATININE 0.75 07/19/2022 0242   CREATININE 0.83 05/25/2020 1448   CALCIUM 9.8 07/19/2022 0242   GFRNONAA >60 07/19/2022 0242    INR    Component Value Date/Time   INR 1.0 07/11/2022 1000     Intake/Output Summary (Last 24 hours) at 07/19/2022 0710 Last data filed at 07/19/2022 2130 Gross per 24 hour  Intake 2440 ml  Output 2155 ml  Net 285 ml     Assessment/Plan:  61 y.o. male is s/p:  Extensive endarterectomy of the left external iliac artery, common femoral artery, and superficial femoral artery with bovine pericardial patch angioplasty  1 Day Post-Op   -pt doing well this morning with brisk doppler flow left foot -pt has walked and voided.  His pain is controlled and he has not had narcotic pain medication since yesterday -discharge home today and f/u in 2-3 weeks - he knows to call sooner if he has any issues.   -discussed with pt that he can stop his pletal but can restart if he continues to  have claudication.   -discussed importance of smoking cessation.  Pt has cut back from 4ppd to 1.5ppd.  encouraged him to continue to work on cutting back.   -DVT prophylaxis:  sq heparin   Leontine Locket, PA-C [facility] [phone] 07/19/2022 7:10 AM   VASCULAR STAFF ADDENDUM: I have independently interviewed and examined the patient. I agree with the above.  Looks great. Ischemic symptoms resolved. Walking without difficulty. Tolerating diet. Ready for discharge.   Yevonne Aline. Stanford Breed, MD [facility] Phone Number: [phone] 07/19/2022 9:30 AM

## 2022-07-19 NOTE — Discharge Summary (Signed)
Discharge Summary     Eddie Chan May 18, 1961 61 y.o. male  [id number]  Admission Date: 07/18/2022  Discharge Date: 07/19/2022  Physician: Angelia Mould, *  Admission Diagnosis: Claudication in peripheral vascular disease ([place]) [I73.9] PAD (peripheral artery disease) ([place]) [I73.9]  HPI:   This is a 61 y.o. male who presents for continued follow-up of his peripheral arterial disease.  Of note he previously had a left common iliac artery stent placed in [place].  He subsequently presented with disabling claudication on the left.  We had multiple discussions about the importance of tobacco cessation.  We were following this conservatively but ultimately his symptoms progressed and he wished to pursue arteriography.  On 10/12/2020 he underwent angioplasty and stenting of a left superficial femoral artery occlusion.  He was last seen in our office in October 2022 at which point his common iliac artery stent and his SFA stent were patent.   He states that about a month ago he developed fairly sudden onset of pain in both lower extremities which is brought on by ambulation and relieved with rest.  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  His symptoms are more significant on the left side.  I do not get any history of rest pain or nonhealing ulcers.  He continues to smoke about 1-1/2 packs/day and has been smoking for over 40 years.   He is on Plavix and is on a statin.   His risk factors for peripheral arterial disease include hypercholesterolemia and tobacco use.  He denies any history of diabetes, hypertension, or family history of premature cardiovascular disease.  On admission to the hospital, pt with disabling claudication of the right lower extremity.  We discussed conservative treatment including tobacco cessation and a structured walking program.  He felt that he could not tolerate his symptoms.  He underwent an arteriogram  which showed disease in the common femoral and proximal superficial femoral artery in addition to a stenosis just above the knee.  After reviewing the films with multiple partners I think that the safest approach is to address the stenosis in the common femoral artery with vein patch angioplasty and hold off on popliteal angioplasty given the proximity to the knee joint.  If his symptoms do not improve significantly we can take him back to the Cath Lab for possible atherectomy of the popliteal stenosis possible drug-coated balloon angioplasty.  I have discussed this plan with the patient and his wife and they are agreeable to proceed.  Hospital Course:  The patient was admitted to the hospital and taken to the operating room on 07/18/2022 and underwent: Extensive endarterectomy of the left external iliac artery, common femoral artery, and superficial femoral artery with bovine pericardial patch angioplasty    Findings: There was extensive plaque which extended well up the external iliac artery and also well down onto the superficial femoral artery.  Approximately 20 cm of the external, common femoral, and superficial femoral arteries were endarterectomized.  A long bovine pericardial patch was sewn.  The pt tolerated the procedure well and was transported to the PACU in good condition.   By POD 1, pt was doing well with good doppler flow left foot.  Incision looks good without hematoma.  He has ambulated and voided and pain well controlled.  Discussed importance of smoking cessation and wound care.     CBC    Component Value Date/Time   WBC 13.3 (H) 07/19/2022  0242   RBC 4.23 07/19/2022 0242   HGB 14.3 07/19/2022 0242   HCT 40.4 07/19/2022 0242   PLT 149 (L) 07/19/2022 0242   MCV 95.5 07/19/2022 0242   MCH 33.8 07/19/2022 0242   MCHC 35.4 07/19/2022 0242   RDW 12.6 07/19/2022 0242    BMET    Component Value Date/Time   NA 138 07/19/2022 0242   K 4.0 07/19/2022 0242   CL 101 07/19/2022  0242   CO2 28 07/19/2022 0242   GLUCOSE 167 (H) 07/19/2022 0242   BUN 10 07/19/2022 0242   CREATININE 0.75 07/19/2022 0242   CREATININE 0.83 05/25/2020 1448   CALCIUM 9.8 07/19/2022 0242   GFRNONAA >60 07/19/2022 0242       Discharge Diagnosis:  Claudication in peripheral vascular disease ([place]) [I73.9] PAD (peripheral artery disease) ([place]) [I73.9]  Secondary Diagnosis: Patient Active Problem List   Diagnosis Date Noted   Claudication in peripheral vascular disease ([place]) 07/18/2022   PAD (peripheral artery disease) ([place]) 07/18/2022   Chronic obstructive pulmonary disease with acute lower respiratory infection ([place]) 01/17/2022   Hyperlipidemia 09/27/2020   Routine general medical examination at a health care facility 11/20/2017   Neuropathy 03/09/2017   DDD (degenerative disc disease), lumbar 01/06/2017   Tobacco use disorder 01/06/2017   Chronic radicular pain of lower back 01/06/2017   Chronic left hip pain 01/06/2017   Intermittent claudication ([place]) 01/06/2017   Bilateral carpal tunnel syndrome 03/27/2016   Past Medical History:  Diagnosis Date   Hyperlipidemia    Peripheral arterial disease ([place])      Allergies as of 07/19/2022       Reactions   Penicillins Other (See Comments)   Break out Childhood Reaction         Medication List     STOP taking these medications    pravastatin 40 MG tablet Commonly known as: PRAVACHOL       TAKE these medications    acetaminophen 500 MG tablet Commonly known as: TYLENOL Take 1,000 mg by mouth 2 (two) times daily.   albuterol 108 (90 Base) MCG/ACT inhaler Commonly known as: VENTOLIN HFA INHALE 1 PUFF INTO THE LUNGS EVERY 6 HOURS AS NEEDED FOR WHEEZING OR SHORTNESS OF BREATH.   albuterol (2.5 MG/3ML) 0.083% nebulizer solution Commonly known as: PROVENTIL INHALE 3 ML BY NEBULIZATION EVERY 6 HOURS AS NEEDED FOR WHEEZING OR SHORTNESS OF BREATH   aspirin EC 81 MG tablet Take 81 mg by mouth daily. Swallow  whole.   cilostazol 100 MG tablet Commonly known as: PLETAL Take 1 tablet (100 mg total) by mouth 2 (two) times daily. 44mins before or 2hrs after meal What changed:  when to take this additional instructions   clopidogrel 75 MG tablet Commonly known as: PLAVIX Take 1 tablet (75 mg total) by mouth daily.   oxyCODONE-acetaminophen 5-325 MG tablet Commonly known as: Percocet Take 1 tablet by mouth every 6 (six) hours as needed for severe pain.   polyvinyl alcohol 1.4 % ophthalmic solution Commonly known as: LIQUIFILM TEARS Place 1 drop into both eyes as needed for dry eyes.   rosuvastatin 20 MG tablet Commonly known as: Crestor Take 1 tablet (20 mg total) by mouth daily.        Discharge Instructions: [facility] Discharge instructions Lower Extremity Bypass Surgery  Please refer to the following instruction for your post-procedure care. Your surgeon or physician assistant will discuss any changes with you.  Activity  You are encouraged to  walk as much as you can. You can slowly return to normal activities during the month after your surgery. Avoid strenuous activity and heavy lifting until your doctor tells you it's OK. Avoid activities such as vacuuming or swinging a golf club. Do not drive until your doctor give the OK and you are no longer taking prescription pain medications. It is also normal to have difficulty with sleep habits, eating and bowel movement after surgery. These will go away with time.  Bathing/Showering  You may shower after you go home. Do not soak in a bathtub, hot tub, or swim until the incision heals completely.  Incision Care  Clean your incision with mild soap and water. Shower every day. Pat the area dry with a clean towel. You do not need a bandage unless otherwise instructed. Do not apply any ointments or creams to your incision. If you have open wounds you will be instructed how to care for them or a visiting  nurse may be arranged for you. If you have staples or sutures along your incision they will be removed at your post-op appointment. You may have skin glue on your incision. Do not peel it off. It will come off on its own in about one week.  Wash the groin wound with soap and water daily and pat dry. (No tub bath-only shower)  Then put a dry gauze or washcloth in the groin to keep this area dry to help prevent wound infection.  Do this daily and as needed.  Do not use Vaseline or neosporin on your incisions.  Only use soap and water on your incisions and then protect and keep dry.  Diet  Resume your normal diet. There are no special food restrictions following this procedure. A low fat/ low cholesterol diet is recommended for all patients with vascular disease. In order to heal from your surgery, it is CRITICAL to get adequate nutrition. Your body requires vitamins, minerals, and protein. Vegetables are the best source of vitamins and minerals. Vegetables also provide the perfect balance of protein. Processed food has little nutritional value, so try to avoid this.  Medications  Resume taking all your medications unless your doctor or Physician Assistant tells you not to. If your incision is causing pain, you may take over-the-counter pain relievers such as acetaminophen (Tylenol). If you were prescribed a stronger pain medication, please aware these medication can cause nausea and constipation. Prevent nausea by taking the medication with a snack or meal. Avoid constipation by drinking plenty of fluids and eating foods with high amount of fiber, such as fruits, vegetables, and grains. Take Colace 100 mg (an over-the-counter stool softener) twice a day as needed for constipation.  Do not take Tylenol if you are taking prescription pain medications.  Follow Up  Our office will schedule a follow up appointment 2-3 weeks following discharge.  Please call us immediately for any of the following  conditions  Severe or worsening pain in your legs or feet while at rest or while walking Increase pain, redness, warmth, or drainage (pus) from your incision site(s) Fever of 101 degree or higher The swelling in your leg with the bypass suddenly worsens and becomes more painful than when you were in the hospital If you have been instructed to feel your graft pulse then you should do so every day. If you can no longer feel this pulse, call the office immediately. Not all patients are given this instruction.  Leg swelling is common after leg bypass surgery.  The swelling should improve over a few months following surgery. To improve the swelling, you may elevate your legs above the level of your heart while you are sitting or resting. Your surgeon or physician assistant may ask you to apply an ACE wrap or wear compression (TED) stockings to help to reduce swelling.  Reduce your risk of vascular disease  Stop smoking. If you would like help call [facility] at 1-800-QUIT-NOW 831-480-2457) or [place] at 9127256352.  Manage your cholesterol Maintain a desired weight Control your diabetes weight Control your diabetes Keep your blood pressure down  If you have any questions, please call the office at [phone]   Prescriptions given: 1.  Roxicet #12 No Refill 2.  Crestor 20mg  daily #90 three refills (Pravachol discontinued)   Disposition: home  Patient's condition: is Good  Follow up: 1. [facility] in 2-3 weeks   Leontine Locket, PA-C [facility] [phone] 07/19/2022  8:38 AM  - For VQI Registry use ---   Post-op:  Wound infection: No  Graft infection: No  Transfusion: No    If yes, n/a units given New Arrhythmia: No Ipsilateral amputation: No, [ ]  Minor, [ ]  BKA, [ ]  AKA Discharge patency: [x ] Primary, [ ]  Primary assisted, [ ]  Secondary, [ ]  Occluded Patency judged by: [x ] Dopper only, [ ]  Palpable graft pulse, []  Palpable distal pulse, [ ]  ABI  inc. > 0.15, [ ]  Duplex Discharge ABI: R not done, L  D/C Ambulatory Status: Ambulatory  Complications: MI: No, [ ]  Troponin only, [ ]  EKG or Clinical CHF: No Resp failure:No, [ ]  Pneumonia, [ ]  Ventilator Chg in renal function: No, [ ]  Inc. Cr > 0.5, [ ]  Temp. Dialysis,  [ ]  Permanent dialysis Stroke: No, [ ]  Minor, [ ]  Major Return to OR: No  Reason for return to OR: [ ]  Bleeding, [ ]  Infection, [ ]  Thrombosis, [ ]  Revision  Discharge medications: Statin use:  yes ASA use:  yes Plavix use:  yes Beta blocker use: no CCB use:  No ACEI use:   no ARB use:  no Coumadin use: no

## 2022-07-19 NOTE — Progress Notes (Signed)
Patient given discharge instructions, medication list and follow up appointments. Patient verbalized understanding, IV and tele were dcd. Will discharge home as ordered. Transported to exit via wheelchair and nursing staff. Sabatino Williard, [employer]

## 2022-07-19 NOTE — Progress Notes (Signed)
PHARMACIST LIPID MONITORING   Eddie Chan is a 61 y.o. male admitted on 07/18/2022 with PAD s/p endarterectomy of left external iliac artery.  Pharmacy has been consulted to optimize lipid-lowering therapy with the indication of secondary prevention for clinical ASCVD.  Recent Labs:  Lipid Panel (last 6 months):   Lab Results  Component Value Date   CHOL 161 07/19/2022   TRIG 44 07/19/2022   HDL 73 07/19/2022   CHOLHDL 2.2 07/19/2022   VLDL 9 07/19/2022   LDLCALC 79 07/19/2022    Hepatic function panel (last 6 months):   Lab Results  Component Value Date   AST 19 07/11/2022   ALT 14 07/11/2022   ALKPHOS 45 07/11/2022   BILITOT 0.7 07/11/2022    SCr (since admission):   Serum creatinine: 0.75 mg/dL 07/19/22 0242 Estimated creatinine clearance: 82.9 mL/min  Current therapy and lipid therapy tolerance Current lipid-lowering therapy: pravastatin 40mg  Previous lipid-lowering therapies (if applicable): atorvastatin 40mg  Documented or reported allergies or intolerances to lipid-lowering therapies (if applicable): back pain with atorvastatin   Assessment:   Patient agrees with changes to lipid-lowering therapy  Plan:    1.Statin intensity (high intensity recommended for all patients regardless of the LDL): Add or increase statin to high intensity--patient agreeable to rosuvastatin given previous muscle/back pain with atorvastatin. Will discontinue pravastatin and initiate rosuvastatin 20mg  for high-intensity effects and decreased incidence of muscle pain.   2.Add ezetimibe (if any one of the following):   Not indicated at this time.  3.Refer to lipid clinic:   No  4.Follow-up with:  Primary care provider - Hoyt Koch, MD  5.Follow-up labs after discharge:  Changes in lipid therapy were made. Check a lipid panel in 8-12 weeks then annually.       Billey Gosling, PharmD PGY1 Pharmacy Resident 9/23/20237:25 AM

## 2022-07-19 NOTE — Progress Notes (Signed)
Pt ambulated x 200 feet with front wheel walker

## 2022-07-21 ENCOUNTER — Encounter (HOSPITAL_COMMUNITY): Payer: Self-pay | Admitting: Vascular Surgery

## 2022-07-21 ENCOUNTER — Telehealth: Payer: Self-pay | Admitting: *Deleted

## 2022-07-21 NOTE — Telephone Encounter (Signed)
Pt was I hosp for Claudication in peripheral vascular disease. He had (L) femoral enterectomy done and was told to f/u w/ Angelia Mould. Pt has appt on 08/07/21 at [facility].../lmb

## 2022-07-26 ENCOUNTER — Telehealth: Payer: Self-pay

## 2022-07-26 NOTE — Telephone Encounter (Signed)
I spoke with Mr. Eddie Chan to advised that I would not be able to complete the Short Term Disability request until he completes his required portion of the request which includes the ROI. Patient voiced his understanding and stated he will stop by the office on Monday 07/26/22 to complete his forms. Message sent to front office staff to inform.

## 2022-07-29 ENCOUNTER — Telehealth: Payer: Self-pay

## 2022-07-29 ENCOUNTER — Ambulatory Visit (INDEPENDENT_AMBULATORY_CARE_PROVIDER_SITE_OTHER): Payer: BLUE CROSS/BLUE SHIELD | Admitting: Physician Assistant

## 2022-07-29 ENCOUNTER — Other Ambulatory Visit: Payer: Self-pay | Admitting: Physician Assistant

## 2022-07-29 ENCOUNTER — Other Ambulatory Visit: Payer: Self-pay

## 2022-07-29 VITALS — BP 133/73 | HR 77 | Temp 98.0°F | Resp 20 | Ht 69.0 in | Wt 135.6 lb

## 2022-07-29 DIAGNOSIS — I739 Peripheral vascular disease, unspecified: Secondary | ICD-10-CM

## 2022-07-29 MED ORDER — METHOCARBAMOL 750 MG PO TABS: 750.0000 mg | ORAL_TABLET | Freq: Three times a day (TID) | ORAL | 0 refills | Status: DC | PRN

## 2022-07-29 NOTE — Progress Notes (Signed)
Wound

## 2022-07-29 NOTE — Telephone Encounter (Signed)
Pt's wife, Clarene Critchley, called stating that there is a "knot at the end of the area with stitches that is hard and red."  Reviewed pt's chart, returned call for clarification, two identifiers used. Pt is c/o a knot that has grown to the size of a half-dollar in a short amount of time on his L leg. There is a burning pain at the incision site and down the anterior portion of his leg with ambulation. He denies any drainage, fever, or red streaks. Appt scheduled for evaluation of site. Confirmed understanding.

## 2022-07-29 NOTE — Progress Notes (Signed)
POST OPERATIVE OFFICE NOTE    CC:  F/u for surgery  HPI:  This is a 61 y.o. male who has had a  previous left common iliac artery stent and a left superficial femoral artery a stent for an occluded SFA.    The patient was admitted to the hospital and taken to the operating room on 07/18/2022 and underwent: Extensive endarterectomy of the left external iliac artery, common femoral artery, and superficial femoral artery with bovine pericardial patch angioplasty.  His symptoms were  disabling claudication of the right lower extremity with a drop in ABI's.   Pt returns today for follow up.  Pt states he feels a "knot" at the distal end of the left groin incision.  He has pain over the incision and medial thigh with decreased localized skin sensation.  He denies symptoms of rest pain or claudication in the left calf or foot.  He dneis non healing wounds.   Allergies  Allergen Reactions   Penicillins Other (See Comments)    Break out Childhood Reaction     Current Outpatient Medications  Medication Sig Dispense Refill   acetaminophen (TYLENOL) 500 MG tablet Take 1,000 mg by mouth 2 (two) times daily.     albuterol (PROVENTIL) (2.5 MG/3ML) 0.083% nebulizer solution INHALE 3 ML BY NEBULIZATION EVERY 6 HOURS AS NEEDED FOR WHEEZING OR SHORTNESS OF BREATH 150 mL 1   albuterol (VENTOLIN HFA) 108 (90 Base) MCG/ACT inhaler INHALE 1 PUFF INTO THE LUNGS EVERY 6 HOURS AS NEEDED FOR WHEEZING OR SHORTNESS OF BREATH. 6.7 each 2   aspirin EC 81 MG tablet Take 81 mg by mouth daily. Swallow whole.     cilostazol (PLETAL) 100 MG tablet Take 1 tablet (100 mg total) by mouth 2 (two) times daily. before or 2hrs after meal (Patient taking differently: Take 100 mg by mouth daily.) 180 tablet 3   clopidogrel (PLAVIX) 75 MG tablet Take 1 tablet (75 mg total) by mouth daily. 90 tablet 3   oxyCODONE-acetaminophen (PERCOCET) 5-325 MG tablet Take 1 tablet by mouth every 6 (six) hours as needed for severe pain. 12  tablet 0   polyvinyl alcohol (LIQUIFILM TEARS) 1.4 % ophthalmic solution Place 1 drop into both eyes as needed for dry eyes.     rosuvastatin (CRESTOR) 20 MG tablet Take 1 tablet (20 mg total) by mouth daily. 90 tablet 3   methocarbamol (ROBAXIN-750) 750 MG tablet Take 1 tablet (750 mg total) by mouth every 8 (eight) hours as needed for muscle spasms. 20 tablet 0   methocarbamol (ROBAXIN-750) 750 MG tablet Take 1 tablet (750 mg total) by mouth every 8 (eight) hours as needed for muscle spasms. 20 tablet 0   No current facility-administered medications for this visit.     ROS:  See HPI  Physical Exam:     Incision:  well healing incision, palpable scar tissue over the area of incision.  No frank hematoma, no drainage/opening in the incision.  No signs of erythema or infection. Extremities:  Doppler signal AT/PT      Assessment/Plan:  This is a 61 y.o. male who is s/p:Extensive endarterectomy of the left external iliac artery, common femoral artery, and superficial femoral artery with bovine pericardial patch angioplasty.   Incision healing well without hematoma, distal incisional firmness without frank hematoma expansion.  He has declined to take pain medication since surgery.  He states he does have muscle spasms when he is trying to get comfortable to sleep in the medial thigh.  I have prescribed Robaxin q 8 PRN for muscle spasms.  He will continue to increase walking as he tolerates.     F/U next week for regular appoint with ABI's.  He will call if he develops rest pain, claudication or non healing wounds.  He does continue to smoke daily, but has decreased.    He is on Plavix and is on a statin.    Roxy Horseman PA-C [facility] [phone]   Clinic MD:  Stanford Breed

## 2022-08-05 ENCOUNTER — Ambulatory Visit (INDEPENDENT_AMBULATORY_CARE_PROVIDER_SITE_OTHER): Payer: BLUE CROSS/BLUE SHIELD | Admitting: Physician Assistant

## 2022-08-05 ENCOUNTER — Ambulatory Visit (INDEPENDENT_AMBULATORY_CARE_PROVIDER_SITE_OTHER)
Admission: RE | Admit: 2022-08-05 | Discharge: 2022-08-05 | Disposition: A | Discharge status: Home / Self Care | Payer: BLUE CROSS/BLUE SHIELD | Source: Ambulatory Visit | Attending: Vascular Surgery | Admitting: Vascular Surgery

## 2022-08-05 ENCOUNTER — Ambulatory Visit (HOSPITAL_COMMUNITY)
Admission: RE | Admit: 2022-08-05 | Discharge: 2022-08-05 | Disposition: A | Discharge status: Home / Self Care | Payer: BLUE CROSS/BLUE SHIELD | Source: Ambulatory Visit | Attending: Vascular Surgery | Admitting: Vascular Surgery

## 2022-08-05 ENCOUNTER — Other Ambulatory Visit (HOSPITAL_COMMUNITY): Payer: Self-pay | Admitting: Vascular Surgery

## 2022-08-05 VITALS — BP 143/79 | HR 82 | Temp 97.2°F | Resp 20 | Ht 69.0 in | Wt 134.5 lb

## 2022-08-05 DIAGNOSIS — I739 Peripheral vascular disease, unspecified: Secondary | ICD-10-CM

## 2022-08-05 NOTE — Progress Notes (Signed)
POST OPERATIVE OFFICE NOTE    CC:  F/u for surgery  HPI:  This is a 61 y.o. male who is s/p previous left common iliac artery stent and a left superficial femoral artery a stent for an occluded SFA.    The patient was admitted to the hospital and taken to the operating room on 07/18/2022 and underwent: Extensive endarterectomy of the left external iliac artery, common femoral artery, and superficial femoral artery with bovine pericardial patch angioplasty.  His symptoms were  disabling claudication of the right lower extremity with a drop in ABI's.   Pt returns today for follow up.  Pt states he is having decreased sensation just below the groin incision into the upper medial thigh, no edema in the leg or foot.  He has pain with increased ambulation that dissipates with rest in the groin area.  He denies claudication, rest pain or non healing wounds.  He refuses to take narcotics and takes tylenol for pain.    He is a daily smoker.  He does take Plavix and ASA for maximum medical management.    Allergies  Allergen Reactions   Penicillins Other (See Comments)    Break out Childhood Reaction     Current Outpatient Medications  Medication Sig Dispense Refill   acetaminophen (TYLENOL) 500 MG tablet Take 1,000 mg by mouth 2 (two) times daily.     albuterol (PROVENTIL) (2.5 MG/3ML) 0.083% nebulizer solution INHALE 3 ML BY NEBULIZATION EVERY 6 HOURS AS NEEDED FOR WHEEZING OR SHORTNESS OF BREATH 150 mL 1   albuterol (VENTOLIN HFA) 108 (90 Base) MCG/ACT inhaler INHALE 1 PUFF INTO THE LUNGS EVERY 6 HOURS AS NEEDED FOR WHEEZING OR SHORTNESS OF BREATH. 6.7 each 2   aspirin EC 81 MG tablet Take 81 mg by mouth daily. Swallow whole.     cilostazol (PLETAL) 100 MG tablet Take 1 tablet (100 mg total) by mouth 2 (two) times daily. 39mins before or 2hrs after meal (Patient taking differently: Take 100 mg by mouth daily.) 180 tablet 3   clopidogrel (PLAVIX) 75 MG tablet Take 1 tablet (75 mg total) by mouth  daily. 90 tablet 3   methocarbamol (ROBAXIN-750) 750 MG tablet Take 1 tablet (750 mg total) by mouth every 8 (eight) hours as needed for muscle spasms. 20 tablet 0   methocarbamol (ROBAXIN-750) 750 MG tablet Take 1 tablet (750 mg total) by mouth every 8 (eight) hours as needed for muscle spasms. 20 tablet 0   oxyCODONE-acetaminophen (PERCOCET) 5-325 MG tablet Take 1 tablet by mouth every 6 (six) hours as needed for severe pain. 12 tablet 0   polyvinyl alcohol (LIQUIFILM TEARS) 1.4 % ophthalmic solution Place 1 drop into both eyes as needed for dry eyes.     rosuvastatin (CRESTOR) 20 MG tablet Take 1 tablet (20 mg total) by mouth daily. 90 tablet 3   No current facility-administered medications for this visit.     ROS:  See HPI  Physical Exam:   +----------+--------+-----+--------+----------+--------+  LEFT      PSV cm/sRatioStenosisWaveform  Comments  +----------+--------+-----+--------+----------+--------+  EIA Distal92                   biphasic            +----------+--------+-----+--------+----------+--------+  CFA Mid   55                   biphasic            +----------+--------+-----+--------+----------+--------+  CFA Distal68  biphasic            +----------+--------+-----+--------+----------+--------+  SFA Prox  21                   biphasic            +----------+--------+-----+--------+----------+--------+  SFA Mid   105                  triphasic stent     +----------+--------+-----+--------+----------+--------+  SFA Distal20                   biphasic  stent     +----------+--------+-----+--------+----------+--------+  POP Prox  30                   biphasic            +----------+--------+-----+--------+----------+--------+  PTA Distal16                   monophasic          +----------+--------+-----+--------+----------+--------+  DP        14                   monophasic           +----------+--------+-----+--------+----------+--------+     Summary:  Left: Patent left lower extremity arterial system and SFA stent.  No obvious evidence of DVT.   ABI Findings:  +---------+------------------+-----+--------+--------+  Right    Rt Pressure (mmHg)IndexWaveformComment   +---------+------------------+-----+--------+--------+  Brachial 150                                      +---------+------------------+-----+--------+--------+  PTA      122               0.81 biphasic          +---------+------------------+-----+--------+--------+  DP       128               0.85 biphasic          +---------+------------------+-----+--------+--------+  Great Toe96                0.64                   +---------+------------------+-----+--------+--------+   +--------+------------------+-----+----------+-------+  Left    Lt Pressure (mmHg)IndexWaveform  Comment  +--------+------------------+-----+----------+-------+  Brachial50                                        +--------+------------------+-----+----------+-------+  PTA     73                0.49 monophasic         +--------+------------------+-----+----------+-------+  DP      67                0.45 monophasic         +--------+------------------+-----+----------+-------+   +-------+-----------+-----------+------------+------------+  ABI/TBIToday's ABIToday's TBIPrevious ABIPrevious TBI  +-------+-----------+-----------+------------+------------+  Right  0.85       0.64       0.92        0.56          +-------+-----------+-----------+------------+------------+  Left   0.49       absent     0.69        0.32          +-------+-----------+-----------+------------+------------+  Bilateral ABIs appear decreased compared to prior study on 06/19/2022.     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 The right toe-brachial index is abnormal.   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   Incision:  left groin healing well, no erythema or edema, firm small area at distal incision 3 cm in circumference.  No fluctuance.  Extremities:  Doppler Doppler signal AT/PT  Lungs non labored breathing    Assessment/Plan:  This is a 61 y.o. male who is s/p:Extensive endarterectomy of the left external iliac artery, common femoral artery, and superficial femoral artery with bovine pericardial patch angioplasty.     The stent is patent and The groin is healing well.  He has pain issues, however he refuses to take narcotics.  His ABI's demonstrate tibial disease and he was advised to stop smoking.  He is on Plavix and is on a statin. F/U in 3 months with repeat ABI's   Roxy Horseman PA-C [facility] [phone]   Clinic MD:  Stanford Breed

## 2022-08-07 ENCOUNTER — Ambulatory Visit: Payer: BLUE CROSS/BLUE SHIELD

## 2022-08-08 ENCOUNTER — Other Ambulatory Visit: Payer: Self-pay

## 2022-08-08 DIAGNOSIS — I70219 Atherosclerosis of native arteries of extremities with intermittent claudication, unspecified extremity: Secondary | ICD-10-CM

## 2022-08-08 DIAGNOSIS — I739 Peripheral vascular disease, unspecified: Secondary | ICD-10-CM

## 2022-08-20 ENCOUNTER — Telehealth: Payer: Self-pay

## 2022-08-20 NOTE — Telephone Encounter (Signed)
Pt's wife, Eddie Chan, called stating the pt was having stinging and burning pain down his legs. He is having trouble sleeping and can't really drive.  Pt's wife called and she was informed that it the symptoms seemed to be most likely nerve pain and there were medications that could help. Advised to schedule an appt to be seen. She will call back after speaking with pt since he is leery about taking medications. Confirmed understanding.

## 2022-09-23 ENCOUNTER — Telehealth: Payer: Self-pay

## 2022-09-23 NOTE — Telephone Encounter (Signed)
Pt called c/o his feet burning and cramping for the past 2 weeks.  Reviewed pt's chart, returned call for clarification, two identifiers used. He states that the medial aspect of his leg at his incision is extremely tender, to the point of not being able to put on jeans. He can barely drive and has been out of work for the past 10 weeks. He is allowed 12 weeks for short term disability and is going to take the full 12. After that, he is being laid off for 3 months. 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 Pt's appts for January moved earlier given his symptoms. Confirmed understanding.

## 2022-10-01 NOTE — Progress Notes (Unsigned)
  POST OPERATIVE OFFICE NOTE    CC:  F/u for surgery  HPI:  This is a 61 y.o. male who is s/p *** on *** by Dr. Marland Kitchen    Pt returns today for follow up.  Pt states ***   Allergies  Allergen Reactions   Penicillins Other (See Comments)    Break out Childhood Reaction     Current Outpatient Medications  Medication Sig Dispense Refill   acetaminophen (TYLENOL) 500 MG tablet Take 1,000 mg by mouth 2 (two) times daily.     albuterol (PROVENTIL) (2.5 MG/3ML) 0.083% nebulizer solution INHALE 3 ML BY NEBULIZATION EVERY 6 HOURS AS NEEDED FOR WHEEZING OR SHORTNESS OF BREATH 150 mL 1   albuterol (VENTOLIN HFA) 108 (90 Base) MCG/ACT inhaler INHALE 1 PUFF INTO THE LUNGS EVERY 6 HOURS AS NEEDED FOR WHEEZING OR SHORTNESS OF BREATH. 6.7 each 2   aspirin EC 81 MG tablet Take 81 mg by mouth daily. Swallow whole.     cilostazol (PLETAL) 100 MG tablet Take 1 tablet (100 mg total) by mouth 2 (two) times daily. before or 2hrs after meal (Patient taking differently: Take 100 mg by mouth daily.) 180 tablet 3   clopidogrel (PLAVIX) 75 MG tablet Take 1 tablet (75 mg total) by mouth daily. 90 tablet 3   methocarbamol (ROBAXIN-750) 750 MG tablet Take 1 tablet (750 mg total) by mouth every 8 (eight) hours as needed for muscle spasms. 20 tablet 0   methocarbamol (ROBAXIN-750) 750 MG tablet Take 1 tablet (750 mg total) by mouth every 8 (eight) hours as needed for muscle spasms. 20 tablet 0   oxyCODONE-acetaminophen (PERCOCET) 5-325 MG tablet Take 1 tablet by mouth every 6 (six) hours as needed for severe pain. 12 tablet 0   polyvinyl alcohol (LIQUIFILM TEARS) 1.4 % ophthalmic solution Place 1 drop into both eyes as needed for dry eyes.     rosuvastatin (CRESTOR) 20 MG tablet Take 1 tablet (20 mg total) by mouth daily. 90 tablet 3   No current facility-administered medications for this visit.     ROS:  See HPI  Physical Exam:  ***  Incision:  *** Extremities:  *** Neuro: *** Abdomen:   ***    Assessment/Plan:  This is a 61 y.o. male who is s/p: ***  -***   Loel Dubonnet, PA-C [facility] [phone]   Clinic MD:  ***

## 2022-10-02 ENCOUNTER — Ambulatory Visit (HOSPITAL_COMMUNITY)
Admission: RE | Admit: 2022-10-02 | Discharge: 2022-10-02 | Disposition: A | Discharge status: Home / Self Care | Payer: BLUE CROSS/BLUE SHIELD | Source: Ambulatory Visit | Attending: Vascular Surgery | Admitting: Vascular Surgery

## 2022-10-02 ENCOUNTER — Ambulatory Visit (INDEPENDENT_AMBULATORY_CARE_PROVIDER_SITE_OTHER): Payer: BLUE CROSS/BLUE SHIELD | Admitting: Physician Assistant

## 2022-10-02 VITALS — BP 143/80 | HR 83 | Temp 97.6°F | Ht 69.0 in | Wt 131.0 lb

## 2022-10-02 DIAGNOSIS — I70219 Atherosclerosis of native arteries of extremities with intermittent claudication, unspecified extremity: Secondary | ICD-10-CM | POA: Diagnosis present

## 2022-10-02 DIAGNOSIS — I739 Peripheral vascular disease, unspecified: Secondary | ICD-10-CM

## 2022-10-02 DIAGNOSIS — I70213 Atherosclerosis of native arteries of extremities with intermittent claudication, bilateral legs: Secondary | ICD-10-CM | POA: Insufficient documentation

## 2022-10-02 DIAGNOSIS — M79605 Pain in left leg: Secondary | ICD-10-CM | POA: Insufficient documentation

## 2022-10-02 MED ORDER — GABAPENTIN 100 MG PO CAPS: 100.0000 mg | ORAL_CAPSULE | Freq: Two times a day (BID) | ORAL | 0 refills | Status: DC

## 2022-10-08 ENCOUNTER — Other Ambulatory Visit: Payer: Self-pay

## 2022-10-08 DIAGNOSIS — I739 Peripheral vascular disease, unspecified: Secondary | ICD-10-CM

## 2022-10-08 DIAGNOSIS — I70219 Atherosclerosis of native arteries of extremities with intermittent claudication, unspecified extremity: Secondary | ICD-10-CM

## 2022-10-14 ENCOUNTER — Encounter: Payer: Self-pay | Admitting: Internal Medicine

## 2022-10-14 ENCOUNTER — Ambulatory Visit (INDEPENDENT_AMBULATORY_CARE_PROVIDER_SITE_OTHER): Payer: BLUE CROSS/BLUE SHIELD | Admitting: Internal Medicine

## 2022-10-14 VITALS — BP 130/80 | HR 82 | Temp 98.1°F | Ht 69.0 in | Wt 134.0 lb

## 2022-10-14 DIAGNOSIS — J431 Panlobular emphysema: Secondary | ICD-10-CM

## 2022-10-14 DIAGNOSIS — F17200 Nicotine dependence, unspecified, uncomplicated: Secondary | ICD-10-CM | POA: Diagnosis not present

## 2022-10-14 DIAGNOSIS — M5136 Other intervertebral disc degeneration, lumbar region: Secondary | ICD-10-CM | POA: Diagnosis not present

## 2022-10-14 DIAGNOSIS — I739 Peripheral vascular disease, unspecified: Secondary | ICD-10-CM

## 2022-10-14 DIAGNOSIS — E782 Mixed hyperlipidemia: Secondary | ICD-10-CM | POA: Diagnosis not present

## 2022-10-14 LAB — COMPREHENSIVE METABOLIC PANEL
ALT: 14 U/L (ref 0–53)
AST: 20 U/L (ref 0–37)
Albumin: 4.4 g/dL (ref 3.5–5.2)
Alkaline Phosphatase: 47 U/L (ref 39–117)
BUN: 9 mg/dL (ref 6–23)
CO2: 30 mEq/L (ref 19–32)
Calcium: 9.5 mg/dL (ref 8.4–10.5)
Chloride: 97 mEq/L (ref 96–112)
Creatinine, Ser: 0.76 mg/dL (ref 0.40–1.50)
GFR: 96.36 mL/min (ref >60.00)
Glucose, Bld: 99 mg/dL (ref 70–99)
Potassium: 4.1 mEq/L (ref 3.5–5.1)
Sodium: 134 mEq/L — ABNORMAL LOW (ref 135–145)
Total Bilirubin: 0.4 mg/dL (ref 0.2–1.2)
Total Protein: 7.2 g/dL (ref 6.0–8.3)

## 2022-10-14 LAB — CK: Total CK: 61 U/L (ref 7–232)

## 2022-10-14 LAB — CBC
HCT: 44.8 % (ref 39.0–52.0)
Hemoglobin: 15.6 g/dL (ref 13.0–17.0)
MCHC: 34.9 g/dL (ref 30.0–36.0)
MCV: 98.6 fl (ref 78.0–100.0)
Platelets: 194 10*3/uL (ref 150.0–400.0)
RBC: 4.54 Mil/uL (ref 4.22–5.81)
RDW: 13.3 % (ref 11.5–15.5)
WBC: 4.8 10*3/uL (ref 4.0–10.5)

## 2022-10-14 LAB — MAGNESIUM: Magnesium: 2 mg/dL (ref 1.5–2.5)

## 2022-10-14 LAB — VITAMIN B12: Vitamin B-12: 227 pg/mL (ref 211–911)

## 2022-10-14 LAB — VITAMIN D 25 HYDROXY (VIT D DEFICIENCY, FRACTURES): VITD: 26.22 ng/mL — ABNORMAL LOW (ref 30.00–100.00)

## 2022-10-14 LAB — TSH: TSH: 0.95 u[IU]/mL (ref 0.35–5.50)

## 2022-10-14 MED ORDER — ALBUTEROL SULFATE HFA 108 (90 BASE) MCG/ACT IN AERS: INHALATION_SPRAY | RESPIRATORY_TRACT | 2 refills | Status: DC

## 2022-10-14 NOTE — Assessment & Plan Note (Signed)
Checking CBC, CMP, CK, B12, TSH, vitamin D and magnesium to assess new muscle cramps, sensitivity and tingling. Treat as appropriate. Had ABI done at vascular visit recently and does not need repeat.

## 2022-10-14 NOTE — Progress Notes (Signed)
   Subjective:   Patient ID: Eddie Chan, male    DOB: [birth year], 61 y.o.   MRN: [id number]  HPI The patient is a 61 YO man coming in for new left leg problems since vascular bypass to help restore arterial flow. He had follow up with vascular recently and they do not feel it is related. Pain and tingling in the leg. Extreme sensitivity in the upper left leg where procedure was done. Severe muscle cramps in the whole leg which are debilitating.  Review of Systems  Constitutional: Negative.   HENT: Negative.    Eyes: Negative.   Respiratory:  Positive for shortness of breath. Negative for cough and chest tightness.   Cardiovascular:  Negative for chest pain, palpitations and leg swelling.  Gastrointestinal:  Negative for abdominal distention, abdominal pain, constipation, diarrhea, nausea and vomiting.  Musculoskeletal:  Positive for arthralgias, gait problem and myalgias.  Skin: Negative.   Psychiatric/Behavioral: Negative.      Objective:  Physical Exam Constitutional:      Appearance: He is well-developed.  HENT:     Head: Normocephalic and atraumatic.  Cardiovascular:     Rate and Rhythm: Normal rate and regular rhythm.  Pulmonary:     Effort: Pulmonary effort is normal. No respiratory distress.     Breath sounds: Normal breath sounds. No wheezing or rales.  Abdominal:     General: Bowel sounds are normal. There is no distension.     Palpations: Abdomen is soft.     Tenderness: There is no abdominal tenderness. There is no rebound.  Musculoskeletal:        General: Tenderness present.     Cervical back: Normal range of motion.  Skin:    General: Skin is warm and dry.  Neurological:     Mental Status: He is alert and oriented to person, place, and time.     Sensory: Sensory deficit present.     Coordination: Coordination normal.     Comments: Sensitive to touch left upper leg     Vitals:   10/14/22 1045  BP: 130/80  Pulse: 82  Temp: 98.1 F (36.7 C)   TempSrc: Oral  SpO2: 93%  Weight: 134 lb (60.8 kg)  Height: 5\' 9"  (1.753 m)    Assessment & Plan:  Visit time 20 minutes in face to face communication with patient and coordination of care, additional 10 minutes spent in record review, coordination or care, ordering tests, communicating/referring to other healthcare professionals, documenting in medical records all on the same day of the visit for total time 30 minutes spent on the visit.

## 2022-10-14 NOTE — Assessment & Plan Note (Signed)
Has cut back some, counseled about need to quit and likelihood to progress to limb threatening lesion. He will try but is unsure about quit attempt currently.

## 2022-10-14 NOTE — Assessment & Plan Note (Signed)
Refilled albuterol and counseled to quit smoking to avoid progression.

## 2022-10-14 NOTE — Patient Instructions (Addendum)
They said that you could stop taking cilostazol (pletal). If you get the pain in the leg again you can start taking this again.  We are checking the labs.  Try taking a b-complex

## 2022-10-14 NOTE — Assessment & Plan Note (Signed)
States no pain in back and it is uncertain if this is related to new leg problems. If labs normal we can consider.

## 2022-10-30 ENCOUNTER — Ambulatory Visit: Payer: BLUE CROSS/BLUE SHIELD

## 2022-10-30 ENCOUNTER — Encounter (HOSPITAL_COMMUNITY): Payer: BLUE CROSS/BLUE SHIELD

## 2022-12-02 ENCOUNTER — Other Ambulatory Visit: Payer: Self-pay | Admitting: Internal Medicine

## 2022-12-02 DIAGNOSIS — I739 Peripheral vascular disease, unspecified: Secondary | ICD-10-CM

## 2022-12-08 ENCOUNTER — Other Ambulatory Visit: Payer: Self-pay

## 2022-12-08 ENCOUNTER — Telehealth: Payer: Self-pay | Admitting: Internal Medicine

## 2022-12-08 MED ORDER — CLOPIDOGREL BISULFATE 75 MG PO TABS: 75.0000 mg | ORAL_TABLET | Freq: Every day | ORAL | 0 refills | Status: DC

## 2022-12-08 NOTE — Telephone Encounter (Signed)
Pt called wanted Rx refill for clopidogrel (PLAVIX) 75 MG tablet.

## 2022-12-31 ENCOUNTER — Other Ambulatory Visit: Payer: Self-pay | Admitting: Internal Medicine

## 2022-12-31 ENCOUNTER — Telehealth: Payer: Self-pay

## 2022-12-31 DIAGNOSIS — I739 Peripheral vascular disease, unspecified: Secondary | ICD-10-CM

## 2022-12-31 NOTE — Telephone Encounter (Signed)
I reached out to Mr. Eddie Chan regarding FMLA paperwork. I advised pt the paperwork has been completed, faxed & successful fax confirmation was received. Patient voiced his understanding.

## 2023-01-01 ENCOUNTER — Ambulatory Visit (HOSPITAL_COMMUNITY): Payer: BLUE CROSS/BLUE SHIELD

## 2023-01-01 ENCOUNTER — Ambulatory Visit (HOSPITAL_COMMUNITY): Admission: RE | Admit: 2023-01-01 | Payer: BLUE CROSS/BLUE SHIELD | Source: Ambulatory Visit

## 2023-01-01 ENCOUNTER — Ambulatory Visit: Payer: BLUE CROSS/BLUE SHIELD

## 2023-01-22 ENCOUNTER — Ambulatory Visit (INDEPENDENT_AMBULATORY_CARE_PROVIDER_SITE_OTHER): Payer: BLUE CROSS/BLUE SHIELD | Admitting: Physician Assistant

## 2023-01-22 ENCOUNTER — Ambulatory Visit (HOSPITAL_COMMUNITY)
Admission: RE | Admit: 2023-01-22 | Discharge: 2023-01-22 | Disposition: A | Discharge status: Home / Self Care | Payer: BLUE CROSS/BLUE SHIELD | Source: Ambulatory Visit | Attending: Vascular Surgery | Admitting: Vascular Surgery

## 2023-01-22 ENCOUNTER — Ambulatory Visit (INDEPENDENT_AMBULATORY_CARE_PROVIDER_SITE_OTHER)
Admission: RE | Admit: 2023-01-22 | Discharge: 2023-01-22 | Disposition: A | Discharge status: Home / Self Care | Payer: BLUE CROSS/BLUE SHIELD | Source: Ambulatory Visit | Attending: Vascular Surgery | Admitting: Vascular Surgery

## 2023-01-22 VITALS — BP 130/77 | HR 66 | Temp 97.6°F | Ht 70.0 in | Wt 134.0 lb

## 2023-01-22 DIAGNOSIS — I70219 Atherosclerosis of native arteries of extremities with intermittent claudication, unspecified extremity: Secondary | ICD-10-CM | POA: Diagnosis not present

## 2023-01-22 DIAGNOSIS — I70203 Unspecified atherosclerosis of native arteries of extremities, bilateral legs: Secondary | ICD-10-CM | POA: Diagnosis not present

## 2023-01-22 DIAGNOSIS — I739 Peripheral vascular disease, unspecified: Secondary | ICD-10-CM | POA: Diagnosis not present

## 2023-01-22 LAB — VAS US ABI WITH/WO TBI
Left ABI: 0.41
Right ABI: 0.82

## 2023-01-22 NOTE — Progress Notes (Signed)
Office Note     CC:  follow up Requesting Provider:  Hoyt Koch, *  HPI: Eddie Chan is a 62 y.o. (Mar 02, 1961) male who presents for surveillance of PAD.  He underwent left common iliac artery stenting and SFA stenting due to rest pain on 10/12/2020 by Dr. Scot Dock.  Most recently he underwent extensive iliofemoral endarterectomy with bovine patch angioplasty by Dr. Scot Dock on 07/18/2022.  This was also performed due to rest pain.  Since surgery rest pain has resolved however he still has short distance claudication of the left lower extremity.  He also states he has recently developed short distance claudication of the right lower extremity.  Left groin incision has completely healed.  Claudication symptoms impact his day-to-day life however he can tolerate degree of claudication at this time.  He does not have any tissue loss of the left lower extremity.  He is on aspirin, Plavix, statin daily.  He smokes a pack and a half a day which is down from 3 packs a day.  He has no interest in cutting back any further.   Past Medical History:  Diagnosis Date   Hyperlipidemia    Peripheral arterial disease ([place])     Past Surgical History:  Procedure Laterality Date   ABDOMINAL AORTOGRAM W/LOWER EXTREMITY Bilateral 10/12/2020   Procedure: ABDOMINAL AORTOGRAM W/LOWER EXTREMITY;  Surgeon: Angelia Mould, MD;  Location: [place] [facility];  Service: Cardiovascular;  Laterality: Bilateral;   ABDOMINAL AORTOGRAM W/LOWER EXTREMITY N/A 06/27/2022   Procedure: ABDOMINAL AORTOGRAM W/LOWER EXTREMITY;  Surgeon: Angelia Mould, MD;  Location: [place] [facility];  Service: Cardiovascular;  Laterality: N/A;   ENDARTERECTOMY FEMORAL Left 07/18/2022   Procedure: LEFT COMMON FEMORAL ENDARTERECTOMY;  Surgeon: Angelia Mould, MD;  Location: [place];  Service: Vascular;  Laterality: Left;   Left iliac stenting     PATCH ANGIOPLASTY Left 07/18/2022   Procedure: PATCH ANGIOPLASTY OF LEFT  FEMORAL ARTERY USING BOVINE PERICARDIUM PATCH;  Surgeon: Angelia Mould, MD;  Location: [place];  Service: Vascular;  Laterality: Left;   PERIPHERAL VASCULAR INTERVENTION Left 10/12/2020   Procedure: PERIPHERAL VASCULAR INTERVENTION;  Surgeon: Angelia Mould, MD;  Location: [place] [facility];  Service: Cardiovascular;  Laterality: Left;  SFA   SHOULDER SURGERY Left    TONSILLECTOMY      Social History   Socioeconomic History   Marital status: Married    Spouse name: Eddie Chan   Number of children: 2   Years of education: 7   Highest education level: Not on file  Occupational History   Occupation: truck driver  Tobacco Use   Smoking status: Every Day    Packs/day: 1.50    Years: 40.00    Additional pack years: 0.00    Total pack years: 60.00    Types: Cigarettes   Smokeless tobacco: Never  Vaping Use   Vaping Use: Never used  Substance and Sexual Activity   Alcohol use: Yes    Alcohol/week: 3.0 standard drinks of alcohol    Types: 3 Cans of beer per week    Comment: social, 2 beers nightly (12 oz)    Drug use: No   Sexual activity: Yes    Birth control/protection: None  Other Topics Concern   Not on file  Social History Narrative   He drives a dump truck.  Lives at wife and mother in law in a one story home.    He has two children.   Highest level of  education: 7th grade   Social Determinants of Health   Financial Resource Strain: Not on file  Food Insecurity: Not on file  Transportation Needs: Not on file  Physical Activity: Not on file  Stress: Not on file  Social Connections: Not on file  Intimate Partner Violence: Not on file    Family History  Problem Relation Age of Onset   Cancer Mother        lung cancer   Heart disease Father    Diabetes Father     Current Outpatient Medications  Medication Sig Dispense Refill   acetaminophen (TYLENOL) 500 MG tablet Take 1,000 mg by mouth 2 (two) times daily.     albuterol (PROVENTIL) (2.5 MG/3ML)  0.083% nebulizer solution INHALE 3 ML BY NEBULIZATION EVERY 6 HOURS AS NEEDED FOR WHEEZING OR SHORTNESS OF BREATH 150 mL 1   albuterol (VENTOLIN HFA) 108 (90 Base) MCG/ACT inhaler INHALE 1 PUFF INTO THE LUNGS EVERY 6 HOURS AS NEEDED FOR WHEEZING OR SHORTNESS OF BREATH. 6.7 each 2   aspirin EC 81 MG tablet Take 81 mg by mouth daily. Swallow whole.     clopidogrel (PLAVIX) 75 MG tablet Take 1 tablet (75 mg total) by mouth daily. 90 tablet 0   polyvinyl alcohol (LIQUIFILM TEARS) 1.4 % ophthalmic solution Place 1 drop into both eyes as needed for dry eyes.     rosuvastatin (CRESTOR) 20 MG tablet Take 1 tablet (20 mg total) by mouth daily. 90 tablet 3   No current facility-administered medications for this visit.    Allergies  Allergen Reactions   Penicillins Other (See Comments)    Break out Childhood Reaction      REVIEW OF SYSTEMS:   [X]  denotes positive finding, [ ]  denotes negative finding Cardiac  Comments:  Chest pain or chest pressure:    Shortness of breath upon exertion:    Short of breath when lying flat:    Irregular heart rhythm:        Vascular    Pain in calf, thigh, or hip brought on by ambulation:    Pain in feet at night that wakes you up from your sleep:     Blood clot in your veins:    Leg swelling:         Pulmonary    Oxygen at home:    Productive cough:     Wheezing:         Neurologic    Sudden weakness in arms or legs:     Sudden numbness in arms or legs:     Sudden onset of difficulty speaking or slurred speech:    Temporary loss of vision in one eye:     Problems with dizziness:         Gastrointestinal    Blood in stool:     Vomited blood:         Genitourinary    Burning when urinating:     Blood in urine:        Psychiatric    Major depression:         Hematologic    Bleeding problems:    Problems with blood clotting too easily:        Skin    Rashes or ulcers:        Constitutional    Fever or chills:      PHYSICAL  EXAMINATION:  Vitals:   01/22/23 1105  BP: 130/77  Pulse: 66  Temp: 97.6 F (36.4 C)  TempSrc: Temporal  SpO2: 95%  Weight: 134 lb (60.8 kg)  Height: 5\' 10"  (1.778 m)    General:  WDWN in NAD; vital signs documented above Gait: Not observed HENT: WNL, normocephalic Pulmonary: normal non-labored breathing , without Rales, rhonchi,  wheezing Cardiac: regular HR Abdomen: soft, NT, no masses Skin: without rashes Vascular Exam/Pulses: 2+ palpable left femoral pulse; absent pedal pulses however brisk DP and PT signals by Doppler bilaterally Extremities: without ischemic changes, without Gangrene , without cellulitis; without open wounds;  Musculoskeletal: no muscle wasting or atrophy  Neurologic: A&O X 3 Psychiatric:  The pt has Normal affect.   Non-Invasive Vascular Imaging:   Left SFA stent patent however has low velocities Triphasic waveforms proximal to the left SFA stents  ABI/TBIToday's ABIToday's TBIPrevious ABIPrevious TBI  +-------+-----------+-----------+------------+------------+  Right 0.82       0.55                                 +-------+-----------+-----------+------------+------------+  Left  0.41       absent       ASSESSMENT/PLAN:: 62 y.o. male here for follow up for surveillance of PAD  -Mr. Billick is status post extensive left iliofemoral endarterectomy with bovine patch angioplasty.  His rest pain in his left foot has resolved however he still complains of short distance calf claudication.  He is also having similar calf claudication in the right lower extremity.  He is frustrated with his degree of claudication however is grateful that he is now out of rest pain in his left foot.  His left heel has been bothering him over this past several weeks however I do not see any tissue changes on exam.  He states he is still active and tries to walk is much as possible during the day.  He continues to smoke a pack and a half a day and has no interest  in cutting back any further currently.  He has a known near occlusive lesion of the above-the-knee popliteal artery on the left based on recent angiography.  We will repeat studies in 3 months.  He will continue his his efforts to walk as much as possible.  He will notify our office if rest pain returns or if he develops any tissue loss.  Patient's symptoms as well as his imaging studies were discussed with Dr. Scot Dock today.  Plan will be to repeat noninvasive studies in 3 months and patient will be seen by Dr. Scot Dock at that time to discuss any further intervention if necessary.   Eddie Ligas, PA-C [facility] [phone]  Clinic MD:   Scot Dock

## 2023-01-28 ENCOUNTER — Other Ambulatory Visit: Payer: Self-pay

## 2023-01-28 DIAGNOSIS — I739 Peripheral vascular disease, unspecified: Secondary | ICD-10-CM

## 2023-01-28 DIAGNOSIS — I70219 Atherosclerosis of native arteries of extremities with intermittent claudication, unspecified extremity: Secondary | ICD-10-CM

## 2023-03-30 ENCOUNTER — Telehealth: Payer: Self-pay

## 2023-03-30 NOTE — Telephone Encounter (Signed)
Pt's wife, Rosey Bath, called stating that the pt is having burning legs and numbness in his foot.  Reviewed pt's chart, returned call for clarification, two identifiers used. Pt's L leg has a burning sensation from the knee down and he has numbness in his L foot. It is progressively worsening and she is afraid that he can't wait until 6/27. Offered to move him to 6/13, but he didn't want it because it was with a PA, not Dr. Edilia Bo. Placed pt on wait list per pt request. Pt to call back if he changes his mind. Confirmed understanding.

## 2023-04-23 ENCOUNTER — Ambulatory Visit (INDEPENDENT_AMBULATORY_CARE_PROVIDER_SITE_OTHER): Payer: BLUE CROSS/BLUE SHIELD | Admitting: Vascular Surgery

## 2023-04-23 ENCOUNTER — Encounter: Payer: Self-pay | Admitting: Vascular Surgery

## 2023-04-23 ENCOUNTER — Ambulatory Visit (INDEPENDENT_AMBULATORY_CARE_PROVIDER_SITE_OTHER)
Admission: RE | Admit: 2023-04-23 | Discharge: 2023-04-23 | Disposition: A | Discharge status: Home / Self Care | Payer: BLUE CROSS/BLUE SHIELD | Source: Ambulatory Visit | Attending: Vascular Surgery | Admitting: Vascular Surgery

## 2023-04-23 ENCOUNTER — Ambulatory Visit (HOSPITAL_COMMUNITY)
Admission: RE | Admit: 2023-04-23 | Discharge: 2023-04-23 | Disposition: A | Discharge status: Home / Self Care | Payer: BLUE CROSS/BLUE SHIELD | Source: Ambulatory Visit | Attending: Vascular Surgery | Admitting: Vascular Surgery

## 2023-04-23 VITALS — BP 156/78 | HR 57 | Temp 98.2°F | Resp 20 | Ht 70.0 in | Wt 132.0 lb

## 2023-04-23 DIAGNOSIS — I70219 Atherosclerosis of native arteries of extremities with intermittent claudication, unspecified extremity: Secondary | ICD-10-CM

## 2023-04-23 DIAGNOSIS — I739 Peripheral vascular disease, unspecified: Secondary | ICD-10-CM

## 2023-04-23 DIAGNOSIS — I70212 Atherosclerosis of native arteries of extremities with intermittent claudication, left leg: Secondary | ICD-10-CM

## 2023-04-23 LAB — VAS US ABI WITH/WO TBI
Left ABI: 0.26
Right ABI: 0.84

## 2023-04-23 NOTE — Progress Notes (Signed)
REASON FOR VISIT:   Follow-up of peripheral arterial disease  MEDICAL ISSUES:   PERIPHERAL ARTERIAL DISEASE WITH REST PAIN LEFT FOOT: This patient has infrainguinal arterial occlusive disease on the left.  He has known disease in the popliteal artery and now has an occluded superficial femoral artery stent.  His iliofemoral endarterectomy is patent with an excellent femoral pulse.  Given that he is now having rest pain I have recommended arteriography in order to evaluate him for revascularization.  I think there is a small chance he be a candidate for angioplasty and stenting.  More likely we would be getting a roadmap to plan and infrainguinal bypass.  We have again discussed the importance of tobacco cessation.  He is on aspirin, Plavix, and a statin.  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 The patient is on best medical therapy for peripheral arterial disease. The patient has been counseled about the risks of tobacco use in atherosclerotic disease. The patient has been counseled to abstain from any tobacco use. An aortogram with bilateral lower extremity runoff angiography and Left lower extremity intervention and is indicated to better evaluate the patient's lower extremity circulation because of the  limb threatening nature of the patient's diagnosis. Based on the patient's clinical exam and non-invasive data, we anticipate an endovascular intervention in the femoropopliteal and tibial vessels.  Stenting would be favored because of the improved primary patency of these interventions as compared to plain balloon angioplasty.  I have reviewed with the patient the indications for arteriography. In addition, I have reviewed the potential complications of arteriography including but not limited to: Bleeding, arterial injury, arterial thrombosis, dye action, renal insufficiency, or other unpredictable medical problems. I  have explained to the patient that if we find disease amenable to angioplasty we could potentially address this at the same time. I have discussed the potential complications of angioplasty and stenting, including but not limited to: Bleeding, arterial thrombosis, arterial injury, dissection, or the need for surgical intervention.  HPI:   Eddie Chan is a pleasant 62 y.o. male who has had 2 procedures on the left leg as noted below.  On 10/12/2020 he had angioplasty and stenting of the left superficial femoral artery.  On 07/18/2022 he underwent extensive endarterectomy of the left external iliac artery, common femoral artery, and superficial femoral artery with bovine pericardial patch angioplasty.  He was seen by Aggie Moats, PA on 01/22/2023.  At that time the rest pain in his left foot had resolved he was having short distance calf claudication on the left.  He continues to smoke a half 1-1/2 packs/day.  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  He comes in for 57-month follow-up visit.  Since I saw him last, he is now having rest pain in the left foot.  He can only sleep about 4 hours at night.  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  He continues to smoke 1-1/2 packs/day.  He has claudication of the left calf at a short distance.  He has no symptoms in the right leg.  Past Medical History:  Diagnosis Date   Hyperlipidemia    Peripheral arterial disease ([facility])     Family History  Problem Relation Age of Onset   Cancer Mother        lung cancer   Heart disease Father  Diabetes Father     SOCIAL HISTORY: Social History   Tobacco Use   Smoking status: Every Day    Packs/day: 1.50    Years: 40.00    Additional pack years: 0.00    Total pack years: 60.00    Types: Cigarettes   Smokeless tobacco: Never  Substance Use Topics   Alcohol use: Yes    Alcohol/week: 3.0 standard drinks of alcohol     Types: 3 Cans of beer per week    Comment: social, 2 beers nightly (12 oz)     Allergies  Allergen Reactions   Penicillins Other (See Comments)    Break out Childhood Reaction     Current Outpatient Medications  Medication Sig Dispense Refill   acetaminophen (TYLENOL) 500 MG tablet Take 1,000 mg by mouth 2 (two) times daily.     albuterol (PROVENTIL) (2.5 MG/3ML) 0.083% nebulizer solution INHALE 3 ML BY NEBULIZATION EVERY 6 HOURS AS NEEDED FOR WHEEZING OR SHORTNESS OF BREATH 150 mL 1   albuterol (VENTOLIN HFA) 108 (90 Base) MCG/ACT inhaler INHALE 1 PUFF INTO THE LUNGS EVERY 6 HOURS AS NEEDED FOR WHEEZING OR SHORTNESS OF BREATH. 6.7 each 2   aspirin EC 81 MG tablet Take 81 mg by mouth daily. Swallow whole.     clopidogrel (PLAVIX) 75 MG tablet Take 1 tablet (75 mg total) by mouth daily. 90 tablet 0   polyvinyl alcohol (LIQUIFILM TEARS) 1.4 % ophthalmic solution Place 1 drop into both eyes as needed for dry eyes.     rosuvastatin (CRESTOR) 20 MG tablet Take 1 tablet (20 mg total) by mouth daily. 90 tablet 3   No current facility-administered medications for this visit.    REVIEW OF SYSTEMS:  [X]  denotes positive finding, [ ]  denotes negative finding Cardiac  Comments:  Chest pain or chest pressure:    Shortness of breath upon exertion:    Short of breath when lying flat:    Irregular heart rhythm:        Vascular    Pain in calf, thigh, or hip brought on by ambulation: x   Pain in feet at night that wakes you up from your sleep:  x   Blood clot in your veins:    Leg swelling:         Pulmonary    Oxygen at home:    Productive cough:     Wheezing:         Neurologic    Sudden weakness in arms or legs:     Sudden numbness in arms or legs:     Sudden onset of difficulty speaking or slurred speech:    Temporary loss of vision in one eye:     Problems with dizziness:         Gastrointestinal    Blood in stool:     Vomited blood:         Genitourinary    Burning when  urinating:     Blood in urine:        Psychiatric    Major depression:         Hematologic    Bleeding problems:    Problems with blood clotting too easily:        Skin    Rashes or ulcers:        Constitutional    Fever or chills:     PHYSICAL EXAM:   Vitals:   04/23/23 1032  BP: (!) 156/78  Pulse: (!) 57  Resp:  20  Temp: 98.2 F (36.8 C)  SpO2: 97%  Weight: 132 lb (59.9 kg)  Height: 5\' 10"  (1.778 m)    GENERAL: The patient is a well-nourished male, in no acute distress. The vital signs are documented above. CARDIAC: There is a regular rate and rhythm.  VASCULAR: I do not detect carotid bruits. On the right side he has a palpable femoral, popliteal, and dorsalis pedis pulse. On the left he has a palpable femoral pulse.  Neck cannot palpate popliteal or pedal pulses. PULMONARY: There is good air exchange bilaterally without wheezing or rales. ABDOMEN: Soft and non-tender with normal pitched bowel sounds.  MUSCULOSKELETAL: There are no major deformities or cyanosis. NEUROLOGIC: No focal weakness or paresthesias are detected. SKIN: There are no ulcers or rashes noted. PSYCHIATRIC: The patient has a normal affect.  DATA:    ARTERIAL DOPPLER STUDY: I have independently interpreted his arterial Doppler study today.  On the right side there is a biphasic posterior tibial and dorsalis pedis signal.  ABI is 84%.  Toe pressures 121 mmHg.  On the left side there is a monophasic posterior tibial and dorsalis pedis signal.  ABI is 26%.  ARTERIAL DUPLEX: I did interpreted his duplex of the left lower extremity.  He has biphasic flow down to the mid superficial femoral artery.  The stent in the superficial femoral artery is occluded.  [name] [facility] [phone]

## 2023-04-28 ENCOUNTER — Other Ambulatory Visit: Payer: Self-pay

## 2023-04-28 DIAGNOSIS — I70229 Atherosclerosis of native arteries of extremities with rest pain, unspecified extremity: Secondary | ICD-10-CM

## 2023-05-07 ENCOUNTER — Other Ambulatory Visit: Payer: Self-pay | Admitting: Internal Medicine

## 2023-05-18 ENCOUNTER — Ambulatory Visit (HOSPITAL_COMMUNITY)
Admission: RE | Admit: 2023-05-18 | Payer: BLUE CROSS/BLUE SHIELD | Source: Home / Self Care | Admitting: Vascular Surgery

## 2023-05-18 ENCOUNTER — Encounter (HOSPITAL_COMMUNITY): Admission: RE | Payer: Self-pay | Source: Home / Self Care

## 2023-05-18 SURGERY — ABDOMINAL AORTOGRAM W/LOWER EXTREMITY
Anesthesia: LOCAL

## 2023-05-25 ENCOUNTER — Telehealth: Payer: Self-pay

## 2023-05-25 NOTE — Telephone Encounter (Signed)
Patient's wife called and left a message wanting to know the results of an authorization for an angiogram that was supposed to happen on 07/22 but was canceled.  I called back and spoke to pt Eddie Chan.  I informed him that [employer] originally denied this procedure stating it is medically unnecessary.  Our office submitted a reconsideration review request for #[id number].  [employer] Rep confirmed that they did receive this request along will all clinicals.  It takes at least 10 days to make a decision.  If unfavorable, we can schedule a Peer to Peer review between our Dr and theirs.  Pt verbally acknowledged understanding that we will continue to follow up on this.  Pt said he will have his wife call as well.

## 2023-06-03 NOTE — Telephone Encounter (Signed)
Received notification from Ashleigh E., LPN that patient has been approved to proceed with aortogram. Spoke with patient's wife Rosey Bath and offered to schedule procedure on tomorrow, 8/8 after she expressed that patient has been having a lot of pain. She declined appointment, requesting only Dr. Edilia Bo. Appointment scheduled for next available on 8/26. Instructions provided and she voiced understanding.

## 2023-06-04 ENCOUNTER — Other Ambulatory Visit: Payer: Self-pay

## 2023-06-04 DIAGNOSIS — I70229 Atherosclerosis of native arteries of extremities with rest pain, unspecified extremity: Secondary | ICD-10-CM

## 2023-06-22 ENCOUNTER — Ambulatory Visit (HOSPITAL_COMMUNITY)
Admission: RE | Admit: 2023-06-22 | Discharge: 2023-06-22 | Disposition: A | Discharge status: Home / Self Care | Payer: BLUE CROSS/BLUE SHIELD | Attending: Vascular Surgery | Admitting: Vascular Surgery

## 2023-06-22 ENCOUNTER — Ambulatory Visit (HOSPITAL_BASED_OUTPATIENT_CLINIC_OR_DEPARTMENT_OTHER): Payer: BLUE CROSS/BLUE SHIELD

## 2023-06-22 ENCOUNTER — Other Ambulatory Visit: Payer: Self-pay

## 2023-06-22 ENCOUNTER — Encounter (HOSPITAL_COMMUNITY)
Admission: RE | Disposition: A | Discharge status: Home / Self Care | Payer: Self-pay | Source: Home / Self Care | Attending: Vascular Surgery

## 2023-06-22 DIAGNOSIS — I70222 Atherosclerosis of native arteries of extremities with rest pain, left leg: Secondary | ICD-10-CM | POA: Diagnosis present

## 2023-06-22 DIAGNOSIS — F17210 Nicotine dependence, cigarettes, uncomplicated: Secondary | ICD-10-CM | POA: Diagnosis not present

## 2023-06-22 DIAGNOSIS — Z79899 Other long term (current) drug therapy: Secondary | ICD-10-CM | POA: Diagnosis not present

## 2023-06-22 DIAGNOSIS — Z7982 Long term (current) use of aspirin: Secondary | ICD-10-CM | POA: Diagnosis not present

## 2023-06-22 DIAGNOSIS — Z7902 Long term (current) use of antithrombotics/antiplatelets: Secondary | ICD-10-CM | POA: Diagnosis not present

## 2023-06-22 DIAGNOSIS — I70229 Atherosclerosis of native arteries of extremities with rest pain, unspecified extremity: Secondary | ICD-10-CM

## 2023-06-22 DIAGNOSIS — I7090 Unspecified atherosclerosis: Secondary | ICD-10-CM

## 2023-06-22 HISTORY — PX: ABDOMINAL AORTOGRAM W/LOWER EXTREMITY: CATH118223

## 2023-06-22 LAB — POCT I-STAT, CHEM 8
BUN: 12 mg/dL (ref 8–23)
Calcium, Ion: 1.27 mmol/L (ref 1.15–1.40)
Chloride: 98 mmol/L (ref 98–111)
Creatinine, Ser: 0.8 mg/dL (ref 0.61–1.24)
Glucose, Bld: 97 mg/dL (ref 70–99)
HCT: 47 % (ref 39.0–52.0)
Hemoglobin: 16 g/dL (ref 13.0–17.0)
Potassium: 3.9 mmol/L (ref 3.5–5.1)
Sodium: 137 mmol/L (ref 135–145)
TCO2: 27 mmol/L (ref 22–32)

## 2023-06-22 SURGERY — ABDOMINAL AORTOGRAM W/LOWER EXTREMITY
Anesthesia: LOCAL | Laterality: Bilateral

## 2023-06-22 MED ORDER — SODIUM CHLORIDE 0.9 % WEIGHT BASED INFUSION: 1.0000 mL/kg/h | INTRAVENOUS | Status: DC

## 2023-06-22 MED ORDER — LIDOCAINE HCL (PF) 1 % IJ SOLN
INTRAMUSCULAR | Status: AC
  Filled 2023-06-22: qty 30

## 2023-06-22 MED ORDER — HYDRALAZINE HCL 20 MG/ML IJ SOLN
INTRAMUSCULAR | Status: DC | PRN
  Administered 2023-06-22: 10 mg via INTRAVENOUS

## 2023-06-22 MED ORDER — LABETALOL HCL 5 MG/ML IV SOLN: 10.0000 mg | INTRAVENOUS | Status: DC | PRN

## 2023-06-22 MED ORDER — SODIUM CHLORIDE 0.9 % IV SOLN: INTRAVENOUS | Status: DC

## 2023-06-22 MED ORDER — FENTANYL CITRATE (PF) 100 MCG/2ML IJ SOLN
INTRAMUSCULAR | Status: AC
  Filled 2023-06-22: qty 2

## 2023-06-22 MED ORDER — MIDAZOLAM HCL 2 MG/2ML IJ SOLN
INTRAMUSCULAR | Status: DC | PRN
  Administered 2023-06-22: 1 mg via INTRAVENOUS

## 2023-06-22 MED ORDER — LIDOCAINE HCL (PF) 1 % IJ SOLN
INTRAMUSCULAR | Status: DC | PRN
  Administered 2023-06-22: 15 mL

## 2023-06-22 MED ORDER — FENTANYL CITRATE (PF) 100 MCG/2ML IJ SOLN
INTRAMUSCULAR | Status: DC | PRN
  Administered 2023-06-22: 50 ug via INTRAVENOUS

## 2023-06-22 MED ORDER — ACETAMINOPHEN 325 MG PO TABS: 650.0000 mg | ORAL_TABLET | ORAL | Status: DC | PRN

## 2023-06-22 MED ORDER — HEPARIN (PORCINE) IN NACL 1000-0.9 UT/500ML-% IV SOLN
INTRAVENOUS | Status: DC | PRN
  Administered 2023-06-22 (×2): 500 mL

## 2023-06-22 MED ORDER — IODIXANOL 320 MG/ML IV SOLN
INTRAVENOUS | Status: DC | PRN
  Administered 2023-06-22: 90 mL

## 2023-06-22 MED ORDER — SODIUM CHLORIDE 0.9% FLUSH: 3.0000 mL | Freq: Two times a day (BID) | INTRAVENOUS | Status: DC

## 2023-06-22 MED ORDER — ONDANSETRON HCL 4 MG/2ML IJ SOLN: 4.0000 mg | Freq: Four times a day (QID) | INTRAMUSCULAR | Status: DC | PRN

## 2023-06-22 MED ORDER — HYDRALAZINE HCL 20 MG/ML IJ SOLN
INTRAMUSCULAR | Status: AC
  Filled 2023-06-22: qty 1

## 2023-06-22 MED ORDER — SODIUM CHLORIDE 0.9% FLUSH: 3.0000 mL | INTRAVENOUS | Status: DC | PRN

## 2023-06-22 MED ORDER — MIDAZOLAM HCL 2 MG/2ML IJ SOLN
INTRAMUSCULAR | Status: AC
  Filled 2023-06-22: qty 2

## 2023-06-22 MED ORDER — HYDRALAZINE HCL 20 MG/ML IJ SOLN: 5.0000 mg | INTRAMUSCULAR | Status: DC | PRN

## 2023-06-22 MED ORDER — SODIUM CHLORIDE 0.9 % IV SOLN: 250.0000 mL | INTRAVENOUS | Status: DC | PRN

## 2023-06-22 SURGICAL SUPPLY — 16 items
BAG SNAP BAND KOVER 36X36 (MISCELLANEOUS) IMPLANT
CATH ANGIO 5F PIGTAIL 65CM (CATHETERS) IMPLANT
CATH CROSS OVER TEMPO 5F (CATHETERS) IMPLANT
CATH OMNI FLUSH 5F 65CM (CATHETERS) IMPLANT
CATH SOS OMNI O 5F 80CM (CATHETERS) IMPLANT
CATH STRAIGHT 5FR 65CM (CATHETERS) IMPLANT
COVER DOME SNAP 22 D (MISCELLANEOUS) IMPLANT
DEVICE TORQUE .025-.038 (MISCELLANEOUS) IMPLANT
GUIDEWIRE ANGLED .035X260CM (WIRE) IMPLANT
KIT MICROPUNCTURE NIT STIFF (SHEATH) IMPLANT
KIT SYRINGE INJ CVI SPIKEX1 (MISCELLANEOUS) IMPLANT
SET ATX-X65L (MISCELLANEOUS) IMPLANT
SHEATH PINNACLE 5F 10CM (SHEATH) IMPLANT
SHEATH PROBE COVER 6X72 (BAG) IMPLANT
TRAY PV CATH (CUSTOM PROCEDURE TRAY) ×1 IMPLANT
WIRE BENTSON .035X145CM (WIRE) IMPLANT

## 2023-06-22 NOTE — Op Note (Signed)
PATIENT: Eddie Chan      MRN: [id number] DOB: [birth year]    DATE OF PROCEDURE: 06/22/2023  INDICATIONS:    Eddie Chan is a 62 y.o. male who has had previous stents in the left common iliac artery and also in the left superficial femoral artery.  He has also undergone extensive iliofemoral endarterectomy on the left.  He presented with rest pain of the left foot.  The stent was occluded.  PROCEDURE:    Conscious sedation Ultrasound-guided access to the right common femoral artery Aortogram with bilateral iliac arteriogram Bilateral lower extremity runoff  SURGEON: Di Kindle. Edilia Bo, MD, FACS  ANESTHESIA: Local with sedation  EBL: Minimal  TECHNIQUE: The patient was brought to the peripheral vascular lab and was sedated. The period of conscious sedation was 44 minutes.  During that time period, I was present face-to-face 100% of the time.  The patient was administered 1 mg of Versed and 50 mcg of fentanyl. The patient's heart rate, blood pressure, and oxygen saturation were monitored by the nurse continuously during the procedure.  Both groins were prepped and draped in the usual sterile fashion.  Under ultrasound guidance, after the skin was anesthetized, I cannulated the right common femoral artery with a micropuncture needle and a micropuncture sheath was introduced over a wire.  This was exchanged for a 5 [place] sheath over a Bentson wire.  By ultrasound the femoral artery was patent. A real-time image was obtained and sent to the server.  The pigtail catheter was positioned at the L1 vertebral body and flush aortogram obtained.  The catheter was in position above the aortic bifurcation and exchanged for a crossover catheter.  I tried to get into the iliac stent on the left but there was an area in the proximal common iliac artery that was slightly aneurysmal and I was unable to easily get into the stent.  Ultimately I decided it would be best to just shoot both legs  through the pigtail catheter.  Bilateral lower extremity runoff films were obtained.  To get better visualization on the right I did remove the pigtail catheter over a wire and she got retrograde right iliac shot.  At the completion the patient was transferred to the holding area for removal of the sheath.  FINDINGS:   Single renal arteries bilaterally with no significant renal artery stenosis.  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 On the left side, which is the symptomatic side, the left common iliac artery stent is patent.  The hypogastric arteries patent.  The external, common femoral, and proximal superficial femoral arteries are patent.  The deep femoral artery is patent.  There is mild diffuse disease in the superficial femoral artery which is then occluded just above the stent.  There is reconstitution of the popliteal artery at the level of the knee.  He is arteries are small in general.  There is reconstitution of the below-knee popliteal artery with three-vessel runoff on the right via the anterior tibial, posterior tibial, and peroneal arteries.  There is some disease in the proximal anterior tibial artery and also this occludes above the ankle.  The peroneal becomes diminutive in size distally.  The dominant runoff is the posterior tibial artery which is small. On the right side there is some mild diffuse disease in the common femoral and superficial femoral artery.  There is also some mild disease in the popliteal artery behind the knee.  There is three-vessel runoff on the right  although this poor visualization distally because of washout.  CLINICAL NOTE: Given the small size of his superficial femoral artery and popliteal artery I think his best option for revascularization would be proximal superficial femoral artery to below-knee popliteal artery bypass.  I will have his vein mapped while he is here.  He does have a focal outpouching of his common iliac artery on the left  and ultimately he may require a covered stent in the common iliac artery.  Therefore I will try to originate the bypass graft below the common femoral arteries at this site can be cannulated later.  He has some slight ectasia to his infrarenal aorta he went also he will also require a abdominal aortic duplex when he returns postoperatively.  He is agreeable to proceed with bypass of the left leg and this will tentatively be scheduled for 07/03/2023.  [name], MD, FACS [facility]  DATE OF DICTATION:   06/22/2023

## 2023-06-22 NOTE — H&P (View-Only) (Signed)
 REASON FOR VISIT:    For arteriogram.   MEDICAL ISSUES:    PERIPHERAL ARTERIAL DISEASE WITH REST PAIN LEFT FOOT: This patient has infrainguinal arterial occlusive disease on the left.  He has known disease in the popliteal artery and now has an occluded superficial femoral artery stent.  His iliofemoral endarterectomy is patent with an excellent femoral pulse.  Given that he is now having rest pain I have recommended arteriography in order to evaluate him for revascularization.  I think there is a small chance he be a candidate for angioplasty and stenting.  More likely we would be getting a roadmap to plan and infrainguinal bypass.  We have again discussed the importance of tobacco cessation.  He is on aspirin, Plavix, and a statin.   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 The patient is on best medical therapy for peripheral arterial disease. The patient has been counseled about the risks of tobacco use in atherosclerotic disease. The patient has been counseled to abstain from any tobacco use. An aortogram with bilateral lower extremity runoff angiography and Left lower extremity intervention and is indicated to better evaluate the patient's lower extremity circulation because of the  limb threatening nature of the patient's diagnosis. Based on the patient's clinical exam and non-invasive data, we anticipate an endovascular intervention in the femoropopliteal and tibial vessels.  Stenting would be favored because of the improved primary patency of these interventions as compared to plain balloon angioplasty.   I have reviewed with the patient the indications for arteriography. In addition, I have reviewed the potential complications of arteriography including but not limited to: Bleeding, arterial injury, arterial thrombosis, dye action, renal insufficiency, or other unpredictable medical problems. I have explained to the  patient that if we find disease amenable to angioplasty we could potentially address this at the same time. I have discussed the potential complications of angioplasty and stenting, including but not limited to: Bleeding, arterial thrombosis, arterial injury, dissection, or the need for surgical intervention.   HPI:    Eddie Chan is a pleasant 63 y.o. male who has had 2 procedures on the left leg as noted below.   On 10/12/2020 he had angioplasty and stenting of the left superficial femoral artery.   On 07/18/2022 he underwent extensive endarterectomy of the left external iliac artery, common femoral artery, and superficial femoral artery with bovine pericardial patch angioplasty.   He was seen by Aggie Moats, PA on 01/22/2023.  At that time the rest pain in his left foot had resolved he was having short distance calf claudication on the left.  He continues to smoke a half 1-1/2 packs/day.  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  He comes in for 58-month follow-up visit.   Since I saw him last, he is now having rest pain in the left foot.  He can only sleep about 4 hours at night.  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  He continues to smoke 1-1/2 packs/day.  He has claudication of the left calf at a short distance.  He has no symptoms in the right leg.       Past Medical History:  Diagnosis Date   Hyperlipidemia     Peripheral arterial disease (HCC)                 Family History  Problem Relation Age of Onset   Cancer Mother          lung cancer   Heart disease Father     Diabetes Father            SOCIAL HISTORY: Social History         Tobacco Use   Smoking status: Every Day      Packs/day: 1.50      Years: 40.00      Additional pack years: 0.00      Total pack years: 60.00      Types: Cigarettes   Smokeless tobacco: Never  Substance Use Topics   Alcohol use: Yes      Alcohol/week:  3.0 standard drinks of alcohol      Types: 3 Cans of beer per week      Comment: social, 2 beers nightly (12 oz)       Allergies       Allergies  Allergen Reactions   Penicillins Other (See Comments)      Break out Childhood Reaction               Current Outpatient Medications  Medication Sig Dispense Refill   acetaminophen (TYLENOL) 500 MG tablet Take 1,000 mg by mouth 2 (two) times daily.       albuterol (PROVENTIL) (2.5 MG/3ML) 0.083% nebulizer solution INHALE 3 ML BY NEBULIZATION EVERY 6 HOURS AS NEEDED FOR WHEEZING OR SHORTNESS OF BREATH 150 mL 1   albuterol (VENTOLIN HFA) 108 (90 Base) MCG/ACT inhaler INHALE 1 PUFF INTO THE LUNGS EVERY 6 HOURS AS NEEDED FOR WHEEZING OR SHORTNESS OF BREATH. 6.7 each 2   aspirin EC 81 MG tablet Take 81 mg by mouth daily. Swallow whole.       clopidogrel (PLAVIX) 75 MG tablet Take 1 tablet (75 mg total) by mouth daily. 90 tablet 0   polyvinyl alcohol (LIQUIFILM TEARS) 1.4 % ophthalmic solution Place 1 drop into both eyes as needed for dry eyes.       rosuvastatin (CRESTOR) 20 MG tablet Take 1 tablet (20 mg total) by mouth daily. 90 tablet 3      No current facility-administered medications for this visit.        REVIEW OF SYSTEMS:  [X]  denotes positive finding, [ ]  denotes negative finding Cardiac   Comments:  Chest pain or chest pressure:      Shortness of breath upon exertion:      Short of breath when lying flat:      Irregular heart rhythm:             Vascular      Pain in calf, thigh, or hip brought on by ambulation: x    Pain in feet at night that wakes you up from your sleep:  x    Blood clot in your veins:      Leg swelling:              Pulmonary      Oxygen at home:      Productive cough:       Wheezing:              Neurologic      Sudden weakness in arms or legs:       Sudden numbness in arms or legs:       Sudden onset of difficulty speaking or slurred speech:      Temporary loss of vision in one eye:  Problems with dizziness:              Gastrointestinal      Blood in stool:       Vomited blood:              Genitourinary      Burning when urinating:       Blood in urine:             Psychiatric      Major depression:              Hematologic      Bleeding problems:      Problems with blood clotting too easily:             Skin      Rashes or ulcers:             Constitutional      Fever or chills:        PHYSICAL EXAM:       Vitals:    04/23/23 1032  BP: (!) 156/78  Pulse: (!) 57  Resp: 20  Temp: 98.2 F (36.8 C)  SpO2: 97%  Weight: 132 lb (59.9 kg)  Height: 5\' 10"  (1.778 m)      GENERAL: The patient is a well-nourished male, in no acute distress. The vital signs are documented above. CARDIAC: There is a regular rate and rhythm.  VASCULAR: I do not detect carotid bruits. On the right side he has a palpable femoral, popliteal, and dorsalis pedis pulse. On the left he has a palpable femoral pulse.  Neck cannot palpate popliteal or pedal pulses. PULMONARY: There is good air exchange bilaterally without wheezing or rales. ABDOMEN: Soft and non-tender with normal pitched bowel sounds.  MUSCULOSKELETAL: There are no major deformities or cyanosis. NEUROLOGIC: No focal weakness or paresthesias are detected. SKIN: There are no ulcers or rashes noted. PSYCHIATRIC: The patient has a normal affect.   DATA:     ARTERIAL DOPPLER STUDY: I have independently interpreted his arterial Doppler study today.   On the right side there is a biphasic posterior tibial and dorsalis pedis signal.  ABI is 84%.  Toe pressures 121 mmHg.   On the left side there is a monophasic posterior tibial and dorsalis pedis signal.  ABI is 26%.   ARTERIAL DUPLEX: I did interpreted his duplex of the left lower extremity.  He has biphasic flow down to the mid superficial femoral artery.  The stent in the superficial femoral artery is occluded.

## 2023-06-22 NOTE — Progress Notes (Signed)
R groin 5 french arterial sheath pulled. Site level 0. Manual pressure held for 25 minutes. Patient tolerated procedure well. Post-sheath pull R groin level 0. R DP & R PT dopplered. Vital signs stable. Bedrest started at 940. Instructions reviewed with patient.

## 2023-06-22 NOTE — H&P (Signed)
REASON FOR VISIT:    For arteriogram.   MEDICAL ISSUES:    PERIPHERAL ARTERIAL DISEASE WITH REST PAIN LEFT FOOT: This patient has infrainguinal arterial occlusive disease on the left.  He has known disease in the popliteal artery and now has an occluded superficial femoral artery stent.  His iliofemoral endarterectomy is patent with an excellent femoral pulse.  Given that he is now having rest pain I have recommended arteriography in order to evaluate him for revascularization.  I think there is a small chance he be a candidate for angioplasty and stenting.  More likely we would be getting a roadmap to plan and infrainguinal bypass.  We have again discussed the importance of tobacco cessation.  He is on aspirin, Plavix, and a statin.   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 The patient is on best medical therapy for peripheral arterial disease. The patient has been counseled about the risks of tobacco use in atherosclerotic disease. The patient has been counseled to abstain from any tobacco use. An aortogram with bilateral lower extremity runoff angiography and Left lower extremity intervention and is indicated to better evaluate the patient's lower extremity circulation because of the  limb threatening nature of the patient's diagnosis. Based on the patient's clinical exam and non-invasive data, we anticipate an endovascular intervention in the femoropopliteal and tibial vessels.  Stenting would be favored because of the improved primary patency of these interventions as compared to plain balloon angioplasty.   I have reviewed with the patient the indications for arteriography. In addition, I have reviewed the potential complications of arteriography including but not limited to: Bleeding, arterial injury, arterial thrombosis, dye action, renal insufficiency, or other unpredictable medical problems. I have explained to the  patient that if we find disease amenable to angioplasty we could potentially address this at the same time. I have discussed the potential complications of angioplasty and stenting, including but not limited to: Bleeding, arterial thrombosis, arterial injury, dissection, or the need for surgical intervention.   HPI:    Eddie Chan is a pleasant 63 y.o. male who has had 2 procedures on the left leg as noted below.   On 10/12/2020 he had angioplasty and stenting of the left superficial femoral artery.   On 07/18/2022 he underwent extensive endarterectomy of the left external iliac artery, common femoral artery, and superficial femoral artery with bovine pericardial patch angioplasty.   He was seen by Aggie Moats, PA on 01/22/2023.  At that time the rest pain in his left foot had resolved he was having short distance calf claudication on the left.  He continues to smoke a half 1-1/2 packs/day.  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  He comes in for 58-month follow-up visit.   Since I saw him last, he is now having rest pain in the left foot.  He can only sleep about 4 hours at night.  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  He continues to smoke 1-1/2 packs/day.  He has claudication of the left calf at a short distance.  He has no symptoms in the right leg.       Past Medical History:  Diagnosis Date   Hyperlipidemia     Peripheral arterial disease (HCC)                 Family History  Problem Relation Age of Onset   Cancer Mother          lung cancer   Heart disease Father     Diabetes Father            SOCIAL HISTORY: Social History         Tobacco Use   Smoking status: Every Day      Packs/day: 1.50      Years: 40.00      Additional pack years: 0.00      Total pack years: 60.00      Types: Cigarettes   Smokeless tobacco: Never  Substance Use Topics   Alcohol use: Yes      Alcohol/week:  3.0 standard drinks of alcohol      Types: 3 Cans of beer per week      Comment: social, 2 beers nightly (12 oz)       Allergies       Allergies  Allergen Reactions   Penicillins Other (See Comments)      Break out Childhood Reaction               Current Outpatient Medications  Medication Sig Dispense Refill   acetaminophen (TYLENOL) 500 MG tablet Take 1,000 mg by mouth 2 (two) times daily.       albuterol (PROVENTIL) (2.5 MG/3ML) 0.083% nebulizer solution INHALE 3 ML BY NEBULIZATION EVERY 6 HOURS AS NEEDED FOR WHEEZING OR SHORTNESS OF BREATH 150 mL 1   albuterol (VENTOLIN HFA) 108 (90 Base) MCG/ACT inhaler INHALE 1 PUFF INTO THE LUNGS EVERY 6 HOURS AS NEEDED FOR WHEEZING OR SHORTNESS OF BREATH. 6.7 each 2   aspirin EC 81 MG tablet Take 81 mg by mouth daily. Swallow whole.       clopidogrel (PLAVIX) 75 MG tablet Take 1 tablet (75 mg total) by mouth daily. 90 tablet 0   polyvinyl alcohol (LIQUIFILM TEARS) 1.4 % ophthalmic solution Place 1 drop into both eyes as needed for dry eyes.       rosuvastatin (CRESTOR) 20 MG tablet Take 1 tablet (20 mg total) by mouth daily. 90 tablet 3      No current facility-administered medications for this visit.        REVIEW OF SYSTEMS:  [X]  denotes positive finding, [ ]  denotes negative finding Cardiac   Comments:  Chest pain or chest pressure:      Shortness of breath upon exertion:      Short of breath when lying flat:      Irregular heart rhythm:             Vascular      Pain in calf, thigh, or hip brought on by ambulation: x    Pain in feet at night that wakes you up from your sleep:  x    Blood clot in your veins:      Leg swelling:              Pulmonary      Oxygen at home:      Productive cough:       Wheezing:              Neurologic      Sudden weakness in arms or legs:       Sudden numbness in arms or legs:       Sudden onset of difficulty speaking or slurred speech:      Temporary loss of vision in one eye:  Problems with dizziness:              Gastrointestinal      Blood in stool:       Vomited blood:              Genitourinary      Burning when urinating:       Blood in urine:             Psychiatric      Major depression:              Hematologic      Bleeding problems:      Problems with blood clotting too easily:             Skin      Rashes or ulcers:             Constitutional      Fever or chills:        PHYSICAL EXAM:       Vitals:    04/23/23 1032  BP: (!) 156/78  Pulse: (!) 57  Resp: 20  Temp: 98.2 F (36.8 C)  SpO2: 97%  Weight: 132 lb (59.9 kg)  Height: 5\' 10"  (1.778 m)      GENERAL: The patient is a well-nourished male, in no acute distress. The vital signs are documented above. CARDIAC: There is a regular rate and rhythm.  VASCULAR: I do not detect carotid bruits. On the right side he has a palpable femoral, popliteal, and dorsalis pedis pulse. On the left he has a palpable femoral pulse.  Neck cannot palpate popliteal or pedal pulses. PULMONARY: There is good air exchange bilaterally without wheezing or rales. ABDOMEN: Soft and non-tender with normal pitched bowel sounds.  MUSCULOSKELETAL: There are no major deformities or cyanosis. NEUROLOGIC: No focal weakness or paresthesias are detected. SKIN: There are no ulcers or rashes noted. PSYCHIATRIC: The patient has a normal affect.   DATA:     ARTERIAL DOPPLER STUDY: I have independently interpreted his arterial Doppler study today.   On the right side there is a biphasic posterior tibial and dorsalis pedis signal.  ABI is 84%.  Toe pressures 121 mmHg.   On the left side there is a monophasic posterior tibial and dorsalis pedis signal.  ABI is 26%.   ARTERIAL DUPLEX: I did interpreted his duplex of the left lower extremity.  He has biphasic flow down to the mid superficial femoral artery.  The stent in the superficial femoral artery is occluded.

## 2023-06-22 NOTE — Discharge Instructions (Signed)
Femoral Site Care This sheet gives you information about how to care for yourself after your procedure. Your health care provider may also give you more specific instructions. If you have problems or questions, contact your health care provider. What can I expect after the procedure?  After the procedure, it is common to have: Bruising that usually fades within 1-2 weeks. Tenderness at the site. Follow these instructions at home: Wound care Follow instructions from your health care provider about how to take care of your insertion site. Make sure you: Wash your hands with soap and water before you change your bandage (dressing). If soap and water are not available, use hand sanitizer. Remove your dressing as told by your health care provider. In 24 hours Do not take baths, swim, or use a hot tub until your health care provider approves. You may shower 24-48 hours after the procedure or as told by your health care provider. Gently wash the site with plain soap and water. Pat the area dry with a clean towel. Do not rub the site. This may cause bleeding. Do not apply powder or lotion to the site. Keep the site clean and dry. Check your femoral site every day for signs of infection. Check for: Redness, swelling, or pain. Fluid or blood. Warmth. Pus or a bad smell. Activity For the first 2-3 days after your procedure, or as long as directed: Avoid climbing stairs as much as possible. Do not squat. Do not lift anything that is heavier than 10 lb (4.5 kg), or the limit that you are told, until your health care provider says that it is safe. For 5 days Rest as directed. Avoid sitting for a long time without moving. Get up to take short walks every 1-2 hours. Do not drive for 24 hours if you were given a medicine to help you relax (sedative). General instructions Take over-the-counter and prescription medicines only as told by your health care provider. Keep all follow-up visits as told by  your health care provider. This is important. Contact a health care provider if you have: A fever or chills. You have redness, swelling, or pain around your insertion site. Get help right away if: The catheter insertion area swells very fast. You pass out. You suddenly start to sweat or your skin gets clammy. The catheter insertion area is bleeding, and the bleeding does not stop when you hold steady pressure on the area. The area near or just beyond the catheter insertion site becomes pale, cool, tingly, or numb. These symptoms may represent a serious problem that is an emergency. Do not wait to see if the symptoms will go away. Get medical help right away. Call your local emergency services (911 in the [place] Do not drive yourself to the hospital. Summary After the procedure, it is common to have bruising that usually fades within 1-2 weeks. Check your femoral site every day for signs of infection. Do not lift anything that is heavier than 10 lb (4.5 kg), or the limit that you are told, until your health care provider says that it is safe. This information is not intended to replace advice given to you by your health care provider. Make sure you discuss any questions you have with your health care provider. Document Revised: 10/26/2017 Document Reviewed: 10/26/2017 Elsevier Patient Education  2020 [employer] 

## 2023-06-23 ENCOUNTER — Other Ambulatory Visit: Payer: Self-pay

## 2023-06-23 ENCOUNTER — Encounter (HOSPITAL_COMMUNITY): Payer: Self-pay | Admitting: Vascular Surgery

## 2023-06-23 DIAGNOSIS — I70229 Atherosclerosis of native arteries of extremities with rest pain, unspecified extremity: Secondary | ICD-10-CM

## 2023-06-26 ENCOUNTER — Encounter (HOSPITAL_COMMUNITY): Payer: Self-pay | Admitting: Vascular Surgery

## 2023-06-26 ENCOUNTER — Other Ambulatory Visit: Payer: Self-pay

## 2023-06-26 NOTE — Progress Notes (Signed)
SDW call  Patient's wife, Aggie Cosier was given pre-op instructions over the phone. She verbalized understanding of instructions provided.     PCP - Dr. Hillard Danker Cardiologist - denies Pulmonary:    PPM/ICD - denies Device Orders - n/a Rep Notified - n/a   Chest x-ray - n/a EKG -  06/27/2022 Stress Test - ECHO -  Cardiac Cath -   Sleep Study/sleep apnea/CPAP: denies  Non-diabetic   Blood Thinner Instructions: Plavix, states last dose 06/25/2023 Aspirin Instructions: Continue   ERAS Protcol - No, NPO  COVID TEST- n/a    Anesthesia review: Yes:  PAD, high cholesterol, numerous vascular issues/stents  Your procedure is scheduled on Tuesday June 30, 2023  Report to [facility] Main Entrance "A" at 0930  A.M., then check in with the Admitting office.  Call this number if you have problems the morning of surgery:  [phone]   If you have any questions prior to your surgery date call [phone]: Open Monday-Friday 8am-4pm If you experience any cold or flu symptoms such as cough, fever, chills, shortness of breath, etc. between now and your scheduled surgery, please notify us at the above number    Remember:  Do not eat or drink after midnight the night before your surgery  Take these medicines the morning of surgery with A SIP OF WATER:  Tylenol, ASA, crestor  As needed: Albuterol neb or inhaler, eye drops  As of today, STOP taking any Aleve, Naproxen, Ibuprofen, Motrin, Advil, Goody's, BC's, all herbal medications, fish oil, and all vitamins.

## 2023-06-30 ENCOUNTER — Inpatient Hospital Stay (HOSPITAL_COMMUNITY): Payer: BLUE CROSS/BLUE SHIELD | Admitting: Registered Nurse

## 2023-06-30 ENCOUNTER — Inpatient Hospital Stay (HOSPITAL_COMMUNITY)
Admission: RE | Admit: 2023-06-30 | Discharge: 2023-07-02 | DRG: 254 | Disposition: A | Discharge status: Home / Self Care | Payer: BLUE CROSS/BLUE SHIELD | Attending: Vascular Surgery | Admitting: Vascular Surgery

## 2023-06-30 ENCOUNTER — Encounter (HOSPITAL_COMMUNITY)
Admission: RE | Disposition: A | Discharge status: Home / Self Care | Payer: Self-pay | Source: Home / Self Care | Attending: Vascular Surgery

## 2023-06-30 ENCOUNTER — Encounter (HOSPITAL_COMMUNITY): Payer: Self-pay | Admitting: Vascular Surgery

## 2023-06-30 ENCOUNTER — Other Ambulatory Visit: Payer: Self-pay

## 2023-06-30 DIAGNOSIS — F17210 Nicotine dependence, cigarettes, uncomplicated: Secondary | ICD-10-CM | POA: Diagnosis present

## 2023-06-30 DIAGNOSIS — I70222 Atherosclerosis of native arteries of extremities with rest pain, left leg: Secondary | ICD-10-CM | POA: Diagnosis not present

## 2023-06-30 DIAGNOSIS — Z833 Family history of diabetes mellitus: Secondary | ICD-10-CM

## 2023-06-30 DIAGNOSIS — E785 Hyperlipidemia, unspecified: Secondary | ICD-10-CM | POA: Diagnosis present

## 2023-06-30 DIAGNOSIS — Z801 Family history of malignant neoplasm of trachea, bronchus and lung: Secondary | ICD-10-CM | POA: Diagnosis not present

## 2023-06-30 DIAGNOSIS — J449 Chronic obstructive pulmonary disease, unspecified: Secondary | ICD-10-CM | POA: Diagnosis present

## 2023-06-30 DIAGNOSIS — Y831 Surgical operation with implant of artificial internal device as the cause of abnormal reaction of the patient, or of later complication, without mention of misadventure at the time of the procedure: Secondary | ICD-10-CM | POA: Diagnosis present

## 2023-06-30 DIAGNOSIS — T82898A Other specified complication of vascular prosthetic devices, implants and grafts, initial encounter: Secondary | ICD-10-CM | POA: Diagnosis present

## 2023-06-30 DIAGNOSIS — I70229 Atherosclerosis of native arteries of extremities with rest pain, unspecified extremity: Secondary | ICD-10-CM | POA: Diagnosis present

## 2023-06-30 DIAGNOSIS — Z8249 Family history of ischemic heart disease and other diseases of the circulatory system: Secondary | ICD-10-CM | POA: Diagnosis not present

## 2023-06-30 DIAGNOSIS — Z95828 Presence of other vascular implants and grafts: Principal | ICD-10-CM

## 2023-06-30 DIAGNOSIS — Z7902 Long term (current) use of antithrombotics/antiplatelets: Secondary | ICD-10-CM

## 2023-06-30 DIAGNOSIS — M79672 Pain in left foot: Secondary | ICD-10-CM | POA: Diagnosis present

## 2023-06-30 DIAGNOSIS — Z7982 Long term (current) use of aspirin: Secondary | ICD-10-CM

## 2023-06-30 DIAGNOSIS — I739 Peripheral vascular disease, unspecified: Secondary | ICD-10-CM | POA: Diagnosis present

## 2023-06-30 DIAGNOSIS — Z79899 Other long term (current) drug therapy: Secondary | ICD-10-CM

## 2023-06-30 HISTORY — PX: FEMORAL-POPLITEAL BYPASS GRAFT: SHX937

## 2023-06-30 HISTORY — PX: APPLICATION OF WOUND VAC: SHX5189

## 2023-06-30 LAB — TYPE AND SCREEN
ABO/RH(D): A POS
Antibody Screen: NEGATIVE

## 2023-06-30 LAB — CBC
HCT: 44.3 % (ref 39.0–52.0)
HCT: 33.9 % — ABNORMAL LOW (ref 39.0–52.0)
Hemoglobin: 15.1 g/dL (ref 13.0–17.0)
Hemoglobin: 11.5 g/dL — ABNORMAL LOW (ref 13.0–17.0)
MCH: 33.7 pg (ref 26.0–34.0)
MCH: 32.7 pg (ref 26.0–34.0)
MCHC: 34.1 g/dL (ref 30.0–36.0)
MCHC: 33.9 g/dL (ref 30.0–36.0)
MCV: 98.9 fL (ref 80.0–100.0)
MCV: 96.3 fL (ref 80.0–100.0)
Platelets: 141 10*3/uL — ABNORMAL LOW (ref 150–400)
Platelets: 124 10*3/uL — ABNORMAL LOW (ref 150–400)
RBC: 4.48 MIL/uL (ref 4.22–5.81)
RBC: 3.52 MIL/uL — ABNORMAL LOW (ref 4.22–5.81)
RDW: 12.8 % (ref 11.5–15.5)
RDW: 12.9 % (ref 11.5–15.5)
WBC: 5.4 10*3/uL (ref 4.0–10.5)
WBC: 8.2 10*3/uL (ref 4.0–10.5)
nRBC: 0 % (ref 0.0–0.2)
nRBC: 0 % (ref 0.0–0.2)

## 2023-06-30 LAB — URINALYSIS, ROUTINE W REFLEX MICROSCOPIC
Bacteria, UA: NONE SEEN
Bilirubin Urine: NEGATIVE
Glucose, UA: NEGATIVE mg/dL
Ketones, ur: 5 mg/dL — AB
Leukocytes,Ua: NEGATIVE
Nitrite: NEGATIVE
Protein, ur: NEGATIVE mg/dL
Specific Gravity, Urine: 1.012 (ref 1.005–1.030)
pH: 5 (ref 5.0–8.0)

## 2023-06-30 LAB — SURGICAL PCR SCREEN
MRSA, PCR: NEGATIVE
Staphylococcus aureus: NEGATIVE

## 2023-06-30 LAB — COMPREHENSIVE METABOLIC PANEL
ALT: 21 U/L (ref 0–44)
AST: 26 U/L (ref 15–41)
Albumin: 4 g/dL (ref 3.5–5.0)
Alkaline Phosphatase: 47 U/L (ref 38–126)
Anion gap: 14 (ref 5–15)
BUN: 6 mg/dL — ABNORMAL LOW (ref 8–23)
CO2: 23 mmol/L (ref 22–32)
Calcium: 9.4 mg/dL (ref 8.9–10.3)
Chloride: 99 mmol/L (ref 98–111)
Creatinine, Ser: 0.7 mg/dL (ref 0.61–1.24)
GFR, Estimated: >60 mL/min (ref >60)
Glucose, Bld: 82 mg/dL (ref 70–99)
Potassium: 4 mmol/L (ref 3.5–5.1)
Sodium: 136 mmol/L (ref 135–145)
Total Bilirubin: 0.6 mg/dL (ref 0.3–1.2)
Total Protein: 6.8 g/dL (ref 6.5–8.1)

## 2023-06-30 LAB — CREATININE, SERUM
Creatinine, Ser: 0.66 mg/dL (ref 0.61–1.24)
GFR, Estimated: >60 mL/min (ref >60)

## 2023-06-30 LAB — POCT ACTIVATED CLOTTING TIME
Activated Clotting Time: 281 s
Activated Clotting Time: 122 s

## 2023-06-30 LAB — PROTIME-INR
INR: 0.9 (ref 0.8–1.2)
Prothrombin Time: 12.8 s (ref 11.4–15.2)

## 2023-06-30 LAB — APTT: aPTT: 30 s (ref 24–36)

## 2023-06-30 SURGERY — BYPASS GRAFT FEMORAL-POPLITEAL ARTERY
Anesthesia: General | Site: Leg Upper | Laterality: Left

## 2023-06-30 MED ORDER — DEXAMETHASONE SODIUM PHOSPHATE 10 MG/ML IJ SOLN
INTRAMUSCULAR | Status: DC | PRN
  Administered 2023-06-30: 10 mg via INTRAVENOUS

## 2023-06-30 MED ORDER — SUGAMMADEX SODIUM 200 MG/2ML IV SOLN
INTRAVENOUS | Status: DC | PRN
  Administered 2023-06-30 (×2): 100 mg via INTRAVENOUS

## 2023-06-30 MED ORDER — PHENYLEPHRINE HCL (PRESSORS) 10 MG/ML IV SOLN
INTRAVENOUS | Status: DC | PRN
  Administered 2023-06-30: 80 ug via INTRAVENOUS
  Administered 2023-06-30 (×2): 160 ug via INTRAVENOUS
  Administered 2023-06-30 (×4): 80 ug via INTRAVENOUS
  Administered 2023-06-30 (×2): 160 ug via INTRAVENOUS

## 2023-06-30 MED ORDER — ONDANSETRON HCL 4 MG/2ML IJ SOLN
INTRAMUSCULAR | Status: AC
  Filled 2023-06-30: qty 4

## 2023-06-30 MED ORDER — CHLORHEXIDINE GLUCONATE 0.12 % MT SOLN: 15.0000 mL | OROMUCOSAL | Status: AC

## 2023-06-30 MED ORDER — VANCOMYCIN HCL 1000 MG IV SOLR
INTRAVENOUS | Status: DC | PRN
  Administered 2023-06-30: 1000 mg via INTRAVENOUS

## 2023-06-30 MED ORDER — PHENOL 1.4 % MT LIQD: 1.0000 | OROMUCOSAL | Status: DC | PRN

## 2023-06-30 MED ORDER — POLYETHYLENE GLYCOL 3350 17 G PO PACK: 17.0000 g | PACK | Freq: Every day | ORAL | Status: DC | PRN

## 2023-06-30 MED ORDER — FENTANYL CITRATE (PF) 250 MCG/5ML IJ SOLN
INTRAMUSCULAR | Status: AC
  Filled 2023-06-30: qty 5

## 2023-06-30 MED ORDER — PROPOFOL 10 MG/ML IV BOLUS
INTRAVENOUS | Status: AC
  Filled 2023-06-30: qty 20

## 2023-06-30 MED ORDER — ACETAMINOPHEN 10 MG/ML IV SOLN: 1000.0000 mg | Freq: Once | INTRAVENOUS | Status: DC | PRN

## 2023-06-30 MED ORDER — FENTANYL CITRATE (PF) 250 MCG/5ML IJ SOLN
INTRAMUSCULAR | Status: DC | PRN
  Administered 2023-06-30: 50 ug via INTRAVENOUS
  Administered 2023-06-30: 100 ug via INTRAVENOUS
  Administered 2023-06-30 (×3): 50 ug via INTRAVENOUS

## 2023-06-30 MED ORDER — METOPROLOL TARTRATE 5 MG/5ML IV SOLN: 2.0000 mg | INTRAVENOUS | Status: DC | PRN

## 2023-06-30 MED ORDER — HEPARIN 6000 UNIT IRRIGATION SOLUTION
Status: DC | PRN
  Administered 2023-06-30: 1

## 2023-06-30 MED ORDER — CHLORHEXIDINE GLUCONATE CLOTH 2 % EX PADS: 6.0000 | MEDICATED_PAD | Freq: Once | CUTANEOUS | Status: DC

## 2023-06-30 MED ORDER — ACETAMINOPHEN 325 MG PO TABS: 325.0000 mg | ORAL_TABLET | ORAL | Status: DC | PRN

## 2023-06-30 MED ORDER — GUAIFENESIN-DM 100-10 MG/5ML PO SYRP: 15.0000 mL | ORAL_SOLUTION | ORAL | Status: DC | PRN

## 2023-06-30 MED ORDER — MEPERIDINE HCL 25 MG/ML IJ SOLN: 6.2500 mg | INTRAMUSCULAR | Status: DC | PRN

## 2023-06-30 MED ORDER — OXYCODONE HCL 5 MG PO TABS
5.0000 mg | ORAL_TABLET | ORAL | Status: DC | PRN
  Administered 2023-06-30 – 2023-07-02 (×2): 10 mg via ORAL
  Filled 2023-06-30 (×2): qty 2

## 2023-06-30 MED ORDER — HEPARIN SODIUM (PORCINE) 5000 UNIT/ML IJ SOLN
5000.0000 [IU] | Freq: Three times a day (TID) | INTRAMUSCULAR | Status: DC
  Administered 2023-07-01 – 2023-07-02 (×4): 5000 [IU] via SUBCUTANEOUS
  Filled 2023-06-30 (×4): qty 1

## 2023-06-30 MED ORDER — VANCOMYCIN HCL IN DEXTROSE 1-5 GM/200ML-% IV SOLN: 1000.0000 mg | INTRAVENOUS | Status: AC

## 2023-06-30 MED ORDER — PROMETHAZINE HCL 25 MG/ML IJ SOLN: 6.2500 mg | INTRAMUSCULAR | Status: DC | PRN

## 2023-06-30 MED ORDER — MAGNESIUM SULFATE 2 GM/50ML IV SOLN: 2.0000 g | Freq: Every day | INTRAVENOUS | Status: DC | PRN

## 2023-06-30 MED ORDER — DEXAMETHASONE SODIUM PHOSPHATE 10 MG/ML IJ SOLN
INTRAMUSCULAR | Status: AC
  Filled 2023-06-30: qty 1

## 2023-06-30 MED ORDER — ONDANSETRON HCL 4 MG/2ML IJ SOLN
INTRAMUSCULAR | Status: AC
  Filled 2023-06-30: qty 2

## 2023-06-30 MED ORDER — ACETAMINOPHEN 650 MG RE SUPP: 325.0000 mg | RECTAL | Status: DC | PRN

## 2023-06-30 MED ORDER — LABETALOL HCL 5 MG/ML IV SOLN: 10.0000 mg | INTRAVENOUS | Status: DC | PRN

## 2023-06-30 MED ORDER — HEPARIN SODIUM (PORCINE) 1000 UNIT/ML IJ SOLN
INTRAMUSCULAR | Status: AC
  Filled 2023-06-30: qty 20

## 2023-06-30 MED ORDER — LIDOCAINE 2% (20 MG/ML) 5 ML SYRINGE
INTRAMUSCULAR | Status: DC | PRN
  Administered 2023-06-30: 100 mg via INTRAVENOUS

## 2023-06-30 MED ORDER — ONDANSETRON HCL 4 MG/2ML IJ SOLN: 4.0000 mg | Freq: Four times a day (QID) | INTRAMUSCULAR | Status: DC | PRN

## 2023-06-30 MED ORDER — HYDROMORPHONE HCL 1 MG/ML IJ SOLN: 0.2500 mg | INTRAMUSCULAR | Status: DC | PRN

## 2023-06-30 MED ORDER — POTASSIUM CHLORIDE CRYS ER 20 MEQ PO TBCR: 20.0000 meq | EXTENDED_RELEASE_TABLET | Freq: Every day | ORAL | Status: DC | PRN

## 2023-06-30 MED ORDER — CHLORHEXIDINE GLUCONATE CLOTH 2 % EX PADS
6.0000 | MEDICATED_PAD | Freq: Once | CUTANEOUS | Status: DC
  Administered 2023-06-30: 6 via TOPICAL

## 2023-06-30 MED ORDER — LIDOCAINE 2% (20 MG/ML) 5 ML SYRINGE
INTRAMUSCULAR | Status: AC
  Filled 2023-06-30: qty 5

## 2023-06-30 MED ORDER — ACETAMINOPHEN 10 MG/ML IV SOLN
INTRAVENOUS | Status: DC | PRN
Start: 2023-06-30 — End: 2023-06-30
  Administered 2023-06-30: 1000 mg via INTRAVENOUS

## 2023-06-30 MED ORDER — HYDRALAZINE HCL 20 MG/ML IJ SOLN: 5.0000 mg | INTRAMUSCULAR | Status: DC | PRN

## 2023-06-30 MED ORDER — PANTOPRAZOLE SODIUM 40 MG PO TBEC
40.0000 mg | DELAYED_RELEASE_TABLET | Freq: Every day | ORAL | Status: DC
  Administered 2023-06-30 – 2023-07-02 (×3): 40 mg via ORAL
  Filled 2023-06-30 (×3): qty 1

## 2023-06-30 MED ORDER — ASPIRIN 81 MG PO TBEC
81.0000 mg | DELAYED_RELEASE_TABLET | Freq: Every day | ORAL | Status: DC
  Administered 2023-07-01 – 2023-07-02 (×2): 81 mg via ORAL
  Filled 2023-06-30 (×2): qty 1

## 2023-06-30 MED ORDER — CEFAZOLIN SODIUM-DEXTROSE 2-4 GM/100ML-% IV SOLN
2.0000 g | Freq: Three times a day (TID) | INTRAVENOUS | Status: AC
  Administered 2023-06-30 – 2023-07-01 (×2): 2 g via INTRAVENOUS
  Filled 2023-06-30 (×2): qty 100

## 2023-06-30 MED ORDER — HEMOSTATIC AGENTS (NO CHARGE) OPTIME
TOPICAL | Status: DC | PRN
Start: 2023-06-30 — End: 2023-06-30
  Administered 2023-06-30: 1 via TOPICAL

## 2023-06-30 MED ORDER — VANCOMYCIN HCL IN DEXTROSE 1-5 GM/200ML-% IV SOLN
INTRAVENOUS | Status: AC
  Administered 2023-06-30: 1000 mg via INTRAVENOUS
  Filled 2023-06-30: qty 200

## 2023-06-30 MED ORDER — ONDANSETRON HCL 4 MG/2ML IJ SOLN
INTRAMUSCULAR | Status: DC | PRN
  Administered 2023-06-30: 4 mg via INTRAVENOUS

## 2023-06-30 MED ORDER — SODIUM CHLORIDE 0.9 % IV SOLN: INTRAVENOUS | Status: DC

## 2023-06-30 MED ORDER — ALBUTEROL SULFATE (2.5 MG/3ML) 0.083% IN NEBU: 2.5000 mg | INHALATION_SOLUTION | Freq: Four times a day (QID) | RESPIRATORY_TRACT | Status: DC | PRN

## 2023-06-30 MED ORDER — ALBUMIN HUMAN 5 % IV SOLN: INTRAVENOUS | Status: DC | PRN

## 2023-06-30 MED ORDER — SODIUM CHLORIDE 0.9 % IV SOLN: 500.0000 mL | Freq: Once | INTRAVENOUS | Status: DC | PRN

## 2023-06-30 MED ORDER — HEPARIN SODIUM (PORCINE) 1000 UNIT/ML IJ SOLN
INTRAMUSCULAR | Status: DC | PRN
Start: 2023-06-30 — End: 2023-06-30
  Administered 2023-06-30: 6500 [IU] via INTRAVENOUS

## 2023-06-30 MED ORDER — PHENYLEPHRINE HCL-NACL 20-0.9 MG/250ML-% IV SOLN
INTRAVENOUS | Status: DC | PRN
Start: 2023-06-30 — End: 2023-06-30
  Administered 2023-06-30: 50 ug/min via INTRAVENOUS

## 2023-06-30 MED ORDER — MIDAZOLAM HCL 2 MG/2ML IJ SOLN
INTRAMUSCULAR | Status: DC | PRN
  Administered 2023-06-30: 2 mg via INTRAVENOUS

## 2023-06-30 MED ORDER — DOCUSATE SODIUM 100 MG PO CAPS
100.0000 mg | ORAL_CAPSULE | Freq: Every day | ORAL | Status: DC
  Administered 2023-07-02: 100 mg via ORAL
  Filled 2023-06-30 (×2): qty 1

## 2023-06-30 MED ORDER — 0.9 % SODIUM CHLORIDE (POUR BTL) OPTIME
TOPICAL | Status: DC | PRN
  Administered 2023-06-30: 2000 mL

## 2023-06-30 MED ORDER — ROCURONIUM BROMIDE 10 MG/ML (PF) SYRINGE
PREFILLED_SYRINGE | INTRAVENOUS | Status: DC | PRN
  Administered 2023-06-30: 20 mg via INTRAVENOUS
  Administered 2023-06-30: 50 mg via INTRAVENOUS
  Administered 2023-06-30: 20 mg via INTRAVENOUS
  Administered 2023-06-30: 10 mg via INTRAVENOUS

## 2023-06-30 MED ORDER — CHLORHEXIDINE GLUCONATE 0.12 % MT SOLN
OROMUCOSAL | Status: AC
  Administered 2023-06-30: 15 mL via OROMUCOSAL
  Filled 2023-06-30: qty 15

## 2023-06-30 MED ORDER — PROTAMINE SULFATE 10 MG/ML IV SOLN
INTRAVENOUS | Status: DC | PRN
Start: 2023-06-30 — End: 2023-06-30
  Administered 2023-06-30: 40 mg via INTRAVENOUS

## 2023-06-30 MED ORDER — ROSUVASTATIN CALCIUM 20 MG PO TABS
20.0000 mg | ORAL_TABLET | Freq: Every day | ORAL | Status: DC
  Administered 2023-06-30 – 2023-07-02 (×3): 20 mg via ORAL
  Filled 2023-06-30 (×3): qty 1

## 2023-06-30 MED ORDER — PROPOFOL 10 MG/ML IV BOLUS
INTRAVENOUS | Status: DC | PRN
  Administered 2023-06-30: 160 mg via INTRAVENOUS
  Administered 2023-06-30: 50 mg via INTRAVENOUS

## 2023-06-30 MED ORDER — MORPHINE SULFATE (PF) 2 MG/ML IV SOLN: 2.0000 mg | INTRAVENOUS | Status: DC | PRN

## 2023-06-30 MED ORDER — PAPAVERINE HCL 30 MG/ML IJ SOLN
INTRAMUSCULAR | Status: AC
  Filled 2023-06-30: qty 2

## 2023-06-30 MED ORDER — PROTAMINE SULFATE 10 MG/ML IV SOLN
INTRAVENOUS | Status: AC
  Filled 2023-06-30: qty 10

## 2023-06-30 MED ORDER — ACETAMINOPHEN 10 MG/ML IV SOLN
INTRAVENOUS | Status: AC
  Filled 2023-06-30: qty 100

## 2023-06-30 MED ORDER — MIDAZOLAM HCL 2 MG/2ML IJ SOLN
INTRAMUSCULAR | Status: AC
  Filled 2023-06-30: qty 2

## 2023-06-30 MED ORDER — LACTATED RINGERS IV SOLN: INTRAVENOUS | Status: DC

## 2023-06-30 MED ORDER — ALUM & MAG HYDROXIDE-SIMETH 200-200-20 MG/5ML PO SUSP: 15.0000 mL | ORAL | Status: DC | PRN

## 2023-06-30 SURGICAL SUPPLY — 66 items
ADH SKN CLS APL DERMABOND .7 (GAUZE/BANDAGES/DRESSINGS) ×2
BAG COUNTER SPONGE SURGICOUNT (BAG) ×2 IMPLANT
BAG SPNG CNTER NS LX DISP (BAG) ×2
BANDAGE ESMARK 6X9 LF (GAUZE/BANDAGES/DRESSINGS) IMPLANT
BNDG CMPR 9X6 STRL LF SNTH (GAUZE/BANDAGES/DRESSINGS) ×2
BNDG ESMARK 6X9 LF (GAUZE/BANDAGES/DRESSINGS) ×2
CANISTER PREVENA PLUS 150 (CANNISTER) IMPLANT
CANISTER SUCT 3000ML PPV (MISCELLANEOUS) ×2 IMPLANT
CANNULA VESSEL 3MM 2 BLNT TIP (CANNULA) ×4 IMPLANT
CATH EMB 4FR 80 (CATHETERS) IMPLANT
CLIP TI MEDIUM 24 (CLIP) ×2 IMPLANT
CLIP TI WIDE RED SMALL 24 (CLIP) ×2 IMPLANT
COVER PROBE W GEL 5X96 (DRAPES) IMPLANT
CUFF TOURN SGL QUICK 24 (TOURNIQUET CUFF) ×2
CUFF TOURN SGL QUICK 34 (TOURNIQUET CUFF)
CUFF TOURN SGL QUICK 42 (TOURNIQUET CUFF) IMPLANT
CUFF TRNQT CYL 24X4X16.5-23 (TOURNIQUET CUFF) IMPLANT
CUFF TRNQT CYL 24X4X40X1 (TOURNIQUET CUFF) IMPLANT
CUFF TRNQT CYL 34X4.125X (TOURNIQUET CUFF) IMPLANT
DERMABOND ADVANCED .7 DNX12 (GAUZE/BANDAGES/DRESSINGS) ×2 IMPLANT
DRAIN CHANNEL 15F RND FF W/TCR (WOUND CARE) IMPLANT
DRAIN RELI 100 BL SUC LF ST (DRAIN)
DRAPE HALF SHEET 40X57 (DRAPES) IMPLANT
DRAPE X-RAY CASS 24X20 (DRAPES) IMPLANT
DRESSING PEEL AND PLAC PRVNA20 (GAUZE/BANDAGES/DRESSINGS) IMPLANT
DRSG PEEL AND PLACE PREVENA 20 (GAUZE/BANDAGES/DRESSINGS) ×2
ELECT REM PT RETURN 9FT ADLT (ELECTROSURGICAL) ×2
ELECTRODE REM PT RTRN 9FT ADLT (ELECTROSURGICAL) ×2 IMPLANT
EVACUATOR SILICONE 100CC (DRAIN) IMPLANT
GAUZE 4X4 16PLY ~~LOC~~+RFID DBL (SPONGE) IMPLANT
GLOVE BIO SURGEON STRL SZ7.5 (GLOVE) ×2 IMPLANT
GLOVE BIOGEL PI IND STRL 8 (GLOVE) ×2 IMPLANT
GLOVE SURG POLY ORTHO LF SZ7.5 (GLOVE) IMPLANT
GLOVE SURG UNDER LTX SZ8 (GLOVE) ×2 IMPLANT
GOWN STRL REUS W/ TWL LRG LVL3 (GOWN DISPOSABLE) ×6 IMPLANT
GOWN STRL REUS W/TWL LRG LVL3 (GOWN DISPOSABLE) ×6
GRAFT PROPATEN W/RING 6X80X60 (Vascular Products) IMPLANT
INSERT FOGARTY SM (MISCELLANEOUS) IMPLANT
KIT BASIN OR (CUSTOM PROCEDURE TRAY) ×2 IMPLANT
KIT TURNOVER KIT B (KITS) ×2 IMPLANT
MARKER GRAFT CORONARY BYPASS (MISCELLANEOUS) IMPLANT
NS IRRIG 1000ML POUR BTL (IV SOLUTION) ×4 IMPLANT
PACK PERIPHERAL VASCULAR (CUSTOM PROCEDURE TRAY) ×2 IMPLANT
PAD ARMBOARD 7.5X6 YLW CONV (MISCELLANEOUS) ×4 IMPLANT
SET COLLECT BLD 21X3/4 12 (NEEDLE) IMPLANT
SPONGE SURGIFOAM ABS GEL 100 (HEMOSTASIS) IMPLANT
SPONGE T-LAP 18X18 ~~LOC~~+RFID (SPONGE) IMPLANT
STOPCOCK 4 WAY LG BORE MALE ST (IV SETS) IMPLANT
SUT ETHILON 3 0 PS 1 (SUTURE) IMPLANT
SUT MNCRL AB 4-0 PS2 18 (SUTURE) ×4 IMPLANT
SUT PROLENE 5 0 C 1 24 (SUTURE) ×2 IMPLANT
SUT PROLENE 6 0 BV (SUTURE) ×2 IMPLANT
SUT SILK 2 0 SH (SUTURE) ×2 IMPLANT
SUT SILK 3 0 (SUTURE)
SUT SILK 3-0 18XBRD TIE 12 (SUTURE) IMPLANT
SUT VIC AB 2-0 CT1 27 (SUTURE) ×2
SUT VIC AB 2-0 CT1 TAPERPNT 27 (SUTURE) IMPLANT
SUT VIC AB 2-0 CTB1 (SUTURE) ×4 IMPLANT
SUT VIC AB 3-0 SH 27 (SUTURE) ×4
SUT VIC AB 3-0 SH 27X BRD (SUTURE) ×4 IMPLANT
SYR 30ML LL (SYRINGE) IMPLANT
TOWEL GREEN STERILE (TOWEL DISPOSABLE) ×2 IMPLANT
TRAY FOLEY MTR SLVR 16FR STAT (SET/KITS/TRAYS/PACK) ×2 IMPLANT
TUBING EXTENTION W/L.L. (IV SETS) IMPLANT
UNDERPAD 30X36 HEAVY ABSORB (UNDERPADS AND DIAPERS) ×2 IMPLANT
WATER STERILE IRR 1000ML POUR (IV SOLUTION) ×2 IMPLANT

## 2023-06-30 NOTE — Transfer of Care (Signed)
Immediate Anesthesia Transfer of Care Note  Patient: Eddie Chan  Procedure(s) Performed: LEFT FEMORAL-BELOW KNEE POPLITEAL ARTERY BYPASS WITH x 80CM RINGED PTFE (Left: Leg Upper) APPLICATION OF WOUND VAC (Left: Groin)  Patient Location: PACU  Anesthesia Type:General  Level of Consciousness: drowsy and patient cooperative  Airway & Oxygen Therapy: Patient Spontanous Breathing and Patient connected to face mask oxygen  Post-op Assessment: Report given to RN and Post -op Vital signs reviewed and stable  Post vital signs: Reviewed and stable  Last Vitals:  Vitals Value Taken Time  BP 154/89 06/30/23 1609  Temp    Pulse 101 06/30/23 1614  Resp 18 06/30/23 1614  SpO2 96 % 06/30/23 1614  Vitals shown include unfiled device data.  Last Pain:  Vitals:   06/30/23 0958  TempSrc:   PainSc: 0-No pain      Patients Stated Pain Goal: 0 (06/30/23 0958)  Complications: No notable events documented.

## 2023-06-30 NOTE — Consult Note (Incomplete)
PHARMACIST LIPID MONITORING   Eddie Chan is a 62 y.o. male admitted on 06/30/2023 with limb ischemia s/p BKA bypass.  Pharmacy has been consulted to optimize lipid-lowering therapy with the indication of secondary prevention for clinical ASCVD.  Recent Labs:  Lipid Panel (last 6 months):   No results found for: "CHOL", "TRIG", "HDL", "CHOLHDL", "VLDL", "LDLCALC", "LDLDIRECT"  Hepatic function panel (last 6 months):   Lab Results  Component Value Date   AST 26 06/30/2023   ALT 21 06/30/2023   ALKPHOS 47 06/30/2023   BILITOT 0.6 06/30/2023    SCr (since admission):   Serum creatinine: 0.7 mg/dL 84/13/24 4010 Estimated creatinine clearance: 84.9 mL/min  Current therapy and lipid therapy tolerance Current lipid-lowering therapy: rosuvastatin 20 mg (2023-present) Previous lipid-lowering therapies (if applicable): pravastatin 40 mg (2018-2023) Documented or reported allergies or intolerances to lipid-lowering therapies (if applicable): n/a  Assessment:   {Assessment:23889}  Plan:    1.Statin intensity (high intensity recommended for all patients regardless of the LDL):  {Statin therapy:25220}  2.Add ezetimibe (if any one of the following):   {Ezetimibe therapy:25221}  3.Refer to lipid clinic:   {Lipid clinic referral:25222}  4.Follow-up with:  {Lipid monitoring follow-up:23891}  5.Follow-up labs after discharge:  {Follow-up labs:25219}       Carron Brazen, PharmD 06/30/2023, 5:38 PM

## 2023-06-30 NOTE — Anesthesia Preprocedure Evaluation (Signed)
Anesthesia Evaluation  Patient identified by MRN, date of birth, ID band Patient awake    Reviewed: Allergy & Precautions, NPO status , Patient's Chart, lab work & pertinent test results  Airway Mallampati: I       Dental no notable dental hx.    Pulmonary COPD,  COPD inhaler, Current Smoker and Patient abstained from smoking.   Pulmonary exam normal breath sounds clear to auscultation       Cardiovascular + Peripheral Vascular Disease  Normal cardiovascular exam     Neuro/Psych  negative psych ROS   GI/Hepatic negative GI ROS, Neg liver ROS,,,  Endo/Other  negative endocrine ROS    Renal/GU negative Renal ROS  negative genitourinary   Musculoskeletal   Abdominal   Peds negative pediatric ROS (+)  Hematology negative hematology ROS (+)   Anesthesia Other Findings   Reproductive/Obstetrics negative OB ROS                             Anesthesia Physical Anesthesia Plan  ASA: 3  Anesthesia Plan: General   Post-op Pain Management: Ofirmev IV (intra-op)*   Induction: Intravenous  PONV Risk Score and Plan: 2 and Ondansetron, Dexamethasone, Treatment may vary due to age or medical condition and Midazolam  Airway Management Planned: Oral ETT  Additional Equipment: None  Intra-op Plan:   Post-operative Plan: Extubation in OR  Informed Consent: I have reviewed the patients History and Physical, chart, labs and discussed the procedure including the risks, benefits and alternatives for the proposed anesthesia with the patient or authorized representative who has indicated his/her understanding and acceptance.     Dental advisory given  Plan Discussed with: CRNA  Anesthesia Plan Comments:         Anesthesia Quick Evaluation

## 2023-06-30 NOTE — Interval H&P Note (Signed)
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  The various methods of treatment have been discussed with the patient and family. After consideration of risks, benefits and other options for treatment, the patient has consented to  Procedure(s): LEFT FEMORAL-BELOW KNEE POPLITEAL ARTERY BYPASS WITH VEIN (Left) as a surgical intervention.  The patient's history has been reviewed, patient examined, no change in status, stable for surgery.  I have reviewed the patient's chart and labs.  Questions were answered to the patient's satisfaction.     Eddie Chan

## 2023-06-30 NOTE — Anesthesia Procedure Notes (Signed)
Arterial Line Insertion Start/End9/12/2022 12:00 PM, 06/30/2023 12:10 PM Performed by: Leilani Able, MD, Marquis Buggy, CRNA, CRNA  Patient location: OR. Preanesthetic checklist: patient identified, IV checked, site marked, risks and benefits discussed, surgical consent, monitors and equipment checked, pre-op evaluation, timeout performed and anesthesia consent Lidocaine 1% used for infiltration Left, radial was placed Hand hygiene performed , maximum sterile barriers used  and Seldinger technique used  Attempts: 1 Procedure performed without using ultrasound guided technique. Following insertion, dressing applied and Biopatch. Post procedure assessment: normal  Patient tolerated the procedure well with no immediate complications. Additional procedure comments: Patient tolerated well. Marland Kitchen

## 2023-06-30 NOTE — Op Note (Signed)
NAME: Eddie Chan    MRN: [id number] DOB: 07/20/1961    DATE OF OPERATION: 06/30/2023  PREOP DIAGNOSIS:    Critical limb ischemia left lower extremity (rest pain)  POSTOP DIAGNOSIS:    Same  PROCEDURE:    Left common femoral artery exposure Left common femoral to below-knee popliteal artery bypass (6 mm ringed PTFE)  SURGEON: Di Kindle. Edilia Bo, MD  ASSIST: Kayren Eaves, PA  ANESTHESIA: General  EBL: 250 cc  INDICATIONS:    Eddie Chan is a 62 y.o. male who presented with critical limb ischemia.  He had rest pain of the left lower extremity.  He had undergone a previous long iliofemoral endarterectomy extending onto the superficial femoral artery with bovine pericardial patch angioplasty.  He had an occluded popliteal artery down to the level of the knee.  I felt his best option for revascularization was infrainguinal bypass.  FINDINGS:   His saphenous vein was very small below the knee and also in the thigh.  I did not think he had adequate vein for an autogenous bypass.  Based on his vein map the vein in the right leg was not any better.  At the completion of the procedure he had an excellent posterior tibial signal with the Doppler.  TECHNIQUE:   The patient was taken to the operating room and received a general anesthetic.  The left lower extremity was prepped and draped in the usual sterile fashion.  The previous incision in the left groin was opened.  The dissection was carried down through dense scar tissue to expose the common femoral artery which was controlled with a vessel loop.  I then exposed the deep femoral artery and superficial femoral artery.  The patch extended quite well on the superficial femoral artery and so the dissection was carried down to the distal end of the patch.  I exposed the vessels enough that I could clamp proximally distally and also controlled the deep femoral artery.  Through the same incision expose the great saphenous vein in  the thigh and the vein here was very small.  I did not think this was adequate.  I therefore went below the knee and a longitudinal incision was made here.  Here the vein was also quite small.  Therefore I did not think I had adequate vein for an autogenous bypass.  Through the distal incision the dissection was carried down to the popliteal vein which was controlled with a vessel loop and retracted posteriorly.  This exposed the below-knee popliteal artery which was small but patent.  A tunnel was created between the 2 incisions and the patient was heparinized.  A 6 mm ringed PTFE graft was tunneled between the 2 incisions.  Attention was first turned to the proximal anastomosis.  The common femoral, superficial femoral, and deep femoral arteries were clamped.  A longitudinal arteriotomy was made and the bovine pericardial patch at about the level of the deep femoral artery.  This extended onto the superficial femoral artery.  There was some organized clot here which was removed.  I did pass a Fogarty catheter down the SFA and no clot was retrieved distally.  The graft was spatulated and sewn end-to-side to the bovine pericardial patch using continuous 6-0 Prolene suture.  Prior to completing this anastomosis the artery was backbled and flushed appropriately and the anastomosis completed.  The graft had been clamped.  The graft then pulled the appropriate length for anastomosis to the below-knee popliteal artery.  A  tourniquet was placed on the thigh.  The leg was exsanguinated with an Esmarch bandage.  The tourniquet was inflated to 300 mmHg.  A longitudinal arteriotomy was made in the below-knee popliteal artery.  The graft was cut to the appropriate length, spatulated, and sewn end-to-side to the below-knee popliteal artery using continuous 6-0 Prolene suture.  Prior to completing the anastomosis the artery was backbled and flushed appropriately and the anastomosis completed.  Flow was reestablished to the  left leg.  At this point there was an excellent posterior tibial signal with the Doppler.  Hemostasis was obtained in the wounds.  I did place a radiopaque marker around the proximal anastomosis.  The wounds were then each closed with a deep layer of 2-0 Vicryl, a subcutaneous layer with 3-0 Vicryl and the skin closed with 4-0 Monocryl.  A Prevena dressing was placed on the left groin.  The patient tolerated the procedure well was transferred recovery in stable condition.  All needle and sponge counts were correct.  Given the complexity of the case,  the assistant was necessary in order to expedient the procedure and safely perform the technical aspects of the operation.  The assistant provided traction and countertraction to assist with exposure of the artery proximally and distally.  They assisted with suture ligature of multiple branches.  Their assistance was critical in the performance of both the proximal and distal anastomosis.These skills, especially following the Prolene suture for the anastomosis, could not have been adequately performed by a scrub tech assistant.    [name], MD, FACS [facility]  DATE OF DICTATION:   06/30/2023

## 2023-06-30 NOTE — Anesthesia Procedure Notes (Signed)
Procedure Name: Intubation Date/Time: 06/30/2023 12:32 PM  Performed by: Marquis Buggy, CRNAPre-anesthesia Checklist: Patient identified, Emergency Drugs available, Suction available, Patient being monitored and Timeout performed Patient Re-evaluated:Patient Re-evaluated prior to induction Oxygen Delivery Method: Circle system utilized Preoxygenation: Pre-oxygenation with 100% oxygen Induction Type: IV induction Ventilation: Mask ventilation without difficulty Laryngoscope Size: Mac and 4 Grade View: Grade I Tube type: Oral Tube size: 7.0 mm Number of attempts: 1 Airway Equipment and Method: Stylet Placement Confirmation: ETT inserted through vocal cords under direct vision, positive ETCO2 and breath sounds checked- equal and bilateral Secured at: 23 cm Tube secured with: Tape Dental Injury: Teeth and Oropharynx as per pre-operative assessment

## 2023-06-30 NOTE — Plan of Care (Signed)
  Problem: Education: Goal: Understanding of CV disease, CV risk reduction, and recovery process will improve 06/30/2023 1844 by Isla Pence, RN Outcome: Progressing 06/30/2023 1834 by Isla Pence, RN Outcome: Progressing Goal: Individualized Educational Video(s) 06/30/2023 1844 by Isla Pence, RN Outcome: Progressing 06/30/2023 1834 by Isla Pence, RN Outcome: Progressing   Problem: Activity: Goal: Ability to return to baseline activity level will improve 06/30/2023 1844 by Isla Pence, RN Outcome: Progressing 06/30/2023 1834 by Isla Pence, RN Outcome: Progressing   Problem: Cardiovascular: Goal: Ability to achieve and maintain adequate cardiovascular perfusion will improve 06/30/2023 1844 by Isla Pence, RN Outcome: Progressing 06/30/2023 1834 by Isla Pence, RN Outcome: Progressing Goal: Vascular access site(s) Level 0-1 will be maintained 06/30/2023 1844 by Isla Pence, RN Outcome: Progressing 06/30/2023 1834 by Isla Pence, RN Outcome: Progressing   Problem: Health Behavior/Discharge Planning: Goal: Ability to safely manage health-related needs after discharge will improve 06/30/2023 1844 by Isla Pence, RN Outcome: Progressing 06/30/2023 1834 by Isla Pence, RN Outcome: Progressing   Problem: Education: Goal: Knowledge of prescribed regimen will improve 06/30/2023 1844 by Isla Pence, RN Outcome: Progressing 06/30/2023 1834 by Isla Pence, RN Outcome: Progressing   Problem: Activity: Goal: Ability to tolerate increased activity will improve 06/30/2023 1844 by Isla Pence, RN Outcome: Progressing 06/30/2023 1834 by Isla Pence, RN Outcome: Progressing   Problem: Bowel/Gastric: Goal: Gastrointestinal status for postoperative course will improve 06/30/2023 1844 by Isla Pence, RN Outcome: Progressing 06/30/2023 1834 by Isla Pence, RN Outcome: Progressing   Problem: Clinical  Measurements: Goal: Postoperative complications will be avoided or minimized 06/30/2023 1844 by Isla Pence, RN Outcome: Progressing 06/30/2023 1834 by Isla Pence, RN Outcome: Progressing Goal: Signs and symptoms of graft occlusion will improve 06/30/2023 1844 by Isla Pence, RN Outcome: Progressing 06/30/2023 1834 by Isla Pence, RN Outcome: Progressing   Problem: Skin Integrity: Goal: Demonstration of wound healing without infection will improve 06/30/2023 1844 by Isla Pence, RN Outcome: Progressing 06/30/2023 1834 by Isla Pence, RN Outcome: Progressing

## 2023-06-30 NOTE — Plan of Care (Signed)
  Problem: Education: Goal: Understanding of CV disease, CV risk reduction, and recovery process will improve Outcome: Progressing Goal: Individualized Educational Video(s) Outcome: Progressing   Problem: Activity: Goal: Ability to return to baseline activity level will improve Outcome: Progressing   Problem: Cardiovascular: Goal: Ability to achieve and maintain adequate cardiovascular perfusion will improve Outcome: Progressing Goal: Vascular access site(s) Level 0-1 will be maintained Outcome: Progressing   Problem: Health Behavior/Discharge Planning: Goal: Ability to safely manage health-related needs after discharge will improve Outcome: Progressing   Problem: Education: Goal: Knowledge of prescribed regimen will improve Outcome: Progressing   Problem: Activity: Goal: Ability to tolerate increased activity will improve Outcome: Progressing   Problem: Bowel/Gastric: Goal: Gastrointestinal status for postoperative course will improve Outcome: Progressing   Problem: Clinical Measurements: Goal: Postoperative complications will be avoided or minimized Outcome: Progressing Goal: Signs and symptoms of graft occlusion will improve Outcome: Progressing   Problem: Skin Integrity: Goal: Demonstration of wound healing without infection will improve Outcome: Progressing   

## 2023-07-01 ENCOUNTER — Encounter (HOSPITAL_COMMUNITY): Payer: Self-pay | Admitting: Vascular Surgery

## 2023-07-01 LAB — CBC
HCT: 30.3 % — ABNORMAL LOW (ref 39.0–52.0)
Hemoglobin: 10.3 g/dL — ABNORMAL LOW (ref 13.0–17.0)
MCH: 32.6 pg (ref 26.0–34.0)
MCHC: 34 g/dL (ref 30.0–36.0)
MCV: 95.9 fL (ref 80.0–100.0)
Platelets: 122 10*3/uL — ABNORMAL LOW (ref 150–400)
RBC: 3.16 MIL/uL — ABNORMAL LOW (ref 4.22–5.81)
RDW: 12.6 % (ref 11.5–15.5)
WBC: 10.1 10*3/uL (ref 4.0–10.5)
nRBC: 0 % (ref 0.0–0.2)

## 2023-07-01 LAB — LIPID PANEL
Cholesterol: 127 mg/dL (ref 0–200)
HDL: 61 mg/dL (ref >40)
LDL Cholesterol: 58 mg/dL (ref 0–99)
Total CHOL/HDL Ratio: 2.1 ratio
Triglycerides: 41 mg/dL (ref <150)
VLDL: 8 mg/dL (ref 0–40)

## 2023-07-01 LAB — BASIC METABOLIC PANEL
Anion gap: 11 (ref 5–15)
BUN: 11 mg/dL (ref 8–23)
CO2: 27 mmol/L (ref 22–32)
Calcium: 8.9 mg/dL (ref 8.9–10.3)
Chloride: 96 mmol/L — ABNORMAL LOW (ref 98–111)
Creatinine, Ser: 0.69 mg/dL (ref 0.61–1.24)
GFR, Estimated: >60 mL/min (ref >60)
Glucose, Bld: 205 mg/dL — ABNORMAL HIGH (ref 70–99)
Potassium: 4.2 mmol/L (ref 3.5–5.1)
Sodium: 134 mmol/L — ABNORMAL LOW (ref 135–145)

## 2023-07-01 NOTE — Progress Notes (Signed)
  VASCULAR SURGERY ASSESSMENT & PLAN:   POD 1 S/P LEFT FEM BK POP (PTFE): His incision looks good.  He has a palpable posterior tibial pulse.  The Prevena has a good seal.  Ambulate today.  Anticipate discharge tomorrow.  VASCULAR QUALITY INITIATIVE: He is on aspirin and a statin.  DVT PROPHYLAXIS: Start subcu heparin today.  Again reinforced the need to quit tobacco.  SUBJECTIVE:   No complaints this morning.  PHYSICAL EXAM:   Vitals:   06/30/23 1900 06/30/23 1955 06/30/23 2253 07/01/23 0322  BP:  (!) 142/74 104/69 127/76  Pulse: 87 94 78 78  Resp: 17 20 12    Temp:  98.1 F (36.7 C) (!) 97.5 F (36.4 C) 98 F (36.7 C)  TempSrc:  Oral Axillary Oral  SpO2: 98% 100% 97%   Weight:      Height:       His below the knee incision looks fine. The Prevena has a good seal. He has a palpable posterior tibial pulse.  LABS:   Lab Results  Component Value Date   WBC 10.1 07/01/2023   HGB 10.3 (L) 07/01/2023   HCT 30.3 (L) 07/01/2023   MCV 95.9 07/01/2023   PLT 122 (L) 07/01/2023   Lab Results  Component Value Date   CREATININE 0.69 07/01/2023   Lab Results  Component Value Date   INR 0.9 06/30/2023    PROBLEM LIST:    Principal Problem:   Status post femoral-popliteal bypass surgery Active Problems:   PAD (peripheral artery disease) (HCC)   CURRENT MEDS:    aspirin EC  81 mg Oral Daily   docusate sodium  100 mg Oral Daily   heparin  5,000 Units Subcutaneous Q8H   pantoprazole  40 mg Oral Daily   rosuvastatin  20 mg Oral Daily    [name] Office: [phone] 07/01/2023

## 2023-07-01 NOTE — Progress Notes (Signed)
PHARMACIST LIPID MONITORING   Eddie Chan is a 62 y.o. male admitted on 06/30/2023 with arterial occlusive disease.  Pharmacy has been consulted to optimize lipid-lowering therapy with the indication of secondary prevention for clinical ASCVD.  Recent Labs:  Lipid Panel (last 6 months):   Lab Results  Component Value Date   CHOL 127 07/01/2023   TRIG 41 07/01/2023   HDL 61 07/01/2023   CHOLHDL 2.1 07/01/2023   VLDL 8 07/01/2023   LDLCALC 58 07/01/2023    Hepatic function panel (last 6 months):   Lab Results  Component Value Date   AST 26 06/30/2023   ALT 21 06/30/2023   ALKPHOS 47 06/30/2023   BILITOT 0.6 06/30/2023    SCr (since admission):   Serum creatinine: 0.69 mg/dL 69/62/95 2841 Estimated creatinine clearance: 84.9 mL/min  Current therapy and lipid therapy tolerance Current lipid-lowering therapy: rosuvastatin 20mg   Previous lipid-lowering therapies (if applicable): atorvastatin, pravastatin Documented or reported allergies or intolerances to lipid-lowering therapies (if applicable): n/a  Assessment:   Patient agrees with changes to lipid-lowering therapy  Plan:    1.Statin intensity (high intensity recommended for all patients regardless of the LDL):  No statin changes. The patient is already on a high intensity statin.  2.Add ezetimibe (if any one of the following):   Not indicated at this time.  3.Refer to lipid clinic:   No  4.Follow-up with:  Primary care provider - Myrlene Broker, MD  5.Follow-up labs after discharge:  No changes in lipid therapy, repeat a lipid panel in one year.       Carrington Clamp, PharmD 07/01/2023, 1:02 PM

## 2023-07-01 NOTE — Plan of Care (Signed)
  Problem: Education: Goal: Knowledge of prescribed regimen will improve Outcome: Progressing   Problem: Activity: Goal: Ability to tolerate increased activity will improve Outcome: Progressing   Problem: Bowel/Gastric: Goal: Gastrointestinal status for postoperative course will improve Outcome: Progressing

## 2023-07-01 NOTE — Evaluation (Signed)
Physical Therapy Evaluation Patient Details Name: Eddie Chan MRN: [id number] DOB: 1961-07-08 Today's Date: 07/01/2023  History of Present Illness  Pt is a 62 y/o M presenting to [facility] on 9/3 wtih critical LLE ischemia, s/p L common femoral artery exposure and L common femoral to below-knee popliteal artery bypass on 9/3. PMH includes PAD and HLD.  Clinical Impression  Pt presenting with above diagnosis.  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 Pt with good pain control and reports has been mobilizing in room.  He demonstrate safe transfers and ambulated 300' without difficulty.  Pt with good balance, has home support, and necessary DME.  No further acute PT needs.         If plan is discharge home, recommend the following: Assistance with cooking/housework   Can travel by private vehicle        Equipment Recommendations None recommended by PT  Recommendations for Other Services       Functional Status Assessment Patient has not had a recent decline in their functional status     Precautions / Restrictions Precautions Precaution Comments: wound vac L groin Restrictions Weight Bearing Restrictions: No      Mobility  Bed Mobility Overal bed mobility: Independent                  Transfers Overall transfer level: Independent                      Ambulation/Gait Ambulation/Gait assistance: Independent Gait Distance (Feet): 300 Feet Assistive device: None Gait Pattern/deviations: WFL(Within Functional Limits) Gait velocity: normal     General Gait Details: Had supervision for eval purpose but was safe and independent level  Stairs            Wheelchair Mobility     Tilt Bed    Modified Rankin (Stroke Patients Only)       Balance Overall balance assessment: Independent   Sitting balance-Leahy Scale: Normal       Standing balance-Leahy Scale: Good                               Pertinent Vitals/Pain  Pain Assessment Pain Assessment: 0-10 Pain Score: 4  Pain Location: LLE incision Pain Descriptors / Indicators: Discomfort Pain Intervention(s): Limited activity within patient's tolerance, Monitored during session, Repositioned    Home Living Family/patient expects to be discharged to:: Private residence Living Arrangements: Spouse/significant other;Other relatives Available Help at Discharge: Family;Available PRN/intermittently Type of Home: House Home Access: Ramped entrance       Home Layout: One level Home Equipment: Grab bars - tub/shower;Shower Counsellor (2 wheels);Cane - single point;Wheelchair - manual      Prior Function Prior Level of Function : Independent/Modified Independent;Working/employed;Driving             Mobility Comments: no AD ADLs Comments: Independent, drives a truck for work (locally)     Extremity/Trunk Assessment   Upper Extremity Assessment Upper Extremity Assessment: Overall WFL for tasks assessed    Lower Extremity Assessment Lower Extremity Assessment: Overall WFL for tasks assessed (Did not MMT L LE but was at least 3/5)    Cervical / Trunk Assessment Cervical / Trunk Assessment: Normal  Communication   Communication Communication: No apparent difficulties  Cognition Arousal: Alert Behavior During Therapy: WFL for tasks assessed/performed Overall Cognitive Status: Within Functional Limits for tasks assessed  General Comments General comments (skin integrity, edema, etc.): VSS    Exercises     Assessment/Plan    PT Assessment Patient does not need any further PT services  PT Problem List         PT Treatment Interventions      PT Goals (Current goals can be found in the Care Plan section)  Acute Rehab PT Goals Patient Stated Goal: return home ASAP PT Goal Formulation: All assessment and education complete, DC therapy    Frequency        Co-evaluation               AM-PAC PT "6 Clicks" Mobility  Outcome Measure Help needed turning from your back to your side while in a flat bed without using bedrails?: None Help needed moving from lying on your back to sitting on the side of a flat bed without using bedrails?: None Help needed moving to and from a bed to a chair (including a wheelchair)?: None Help needed standing up from a chair using your arms (e.g., wheelchair or bedside chair)?: None Help needed to walk in hospital room?: None Help needed climbing 3-5 steps with a railing? : A Little 6 Click Score: 23    End of Session   Activity Tolerance: Patient tolerated treatment well Patient left: in bed;with call bell/phone within reach Nurse Communication: Mobility status PT Visit Diagnosis: Other abnormalities of gait and mobility (R26.89)    Time: 1200-1210 PT Time Calculation (min) (ACUTE ONLY): 10 min   Charges:   PT Evaluation $PT Eval Low Complexity: 1 Low   PT General Charges $$ ACUTE PT VISIT: 1 Visit         Anise Salvo, PT Acute Rehab Services [facility] [phone]   Rayetta Humphrey 07/01/2023, 12:15 PM

## 2023-07-01 NOTE — Evaluation (Signed)
Occupational Therapy Evaluation Patient Details Name: Eddie Chan MRN: [id number] DOB: Jun 21, 1961 Today's Date: 07/01/2023   History of Present Illness Pt is a 62 y/o M presenting to [facility] on 9/3 wtih critical LLE ischemia, s/p L common femoral artery exposure and L common femoral to below-knee popliteal artery bypass on 9/3. PMH includes PAD and HLD.   Clinical Impression   Pt reports ind at baseline with ADLs/ functional mobility, lives with spouse who can assist at d/c. Pt currently needing supervision-ind with ADLs, ind with bed mobility and transfers/in-room ambulation. Educated pt on importance of mobility, activity pacing for home. Pt verbalized understanding. Pt presenting with impairments listed below, will follow acutely. Anticipate no OT follow up needs at d/c.       If plan is discharge home, recommend the following: A little help with bathing/dressing/bathroom;A little help with walking and/or transfers;Assist for transportation;Assistance with cooking/housework    Functional Status Assessment  Patient has had a recent decline in their functional status and demonstrates the ability to make significant improvements in function in a reasonable and predictable amount of time.  Equipment Recommendations  None recommended by OT    Recommendations for Other Services PT consult     Precautions / Restrictions Precautions Precaution Comments: wound vac L groin Restrictions Weight Bearing Restrictions: No      Mobility Bed Mobility Overal bed mobility: Independent                  Transfers Overall transfer level: Independent                        Balance Overall balance assessment: No apparent balance deficits (not formally assessed)                                         ADL either performed or assessed with clinical judgement   ADL Overall ADL's : Needs assistance/impaired Eating/Feeding: Independent   Grooming: Independent    Upper Body Bathing: Supervision/ safety   Lower Body Bathing: Supervison/ safety   Upper Body Dressing : Supervision/safety Upper Body Dressing Details (indicate cue type and reason): donning clean gown Lower Body Dressing: Supervision/safety Lower Body Dressing Details (indicate cue type and reason): donning/doffing L sock Toilet Transfer: Independent;Ambulation   Toileting- Clothing Manipulation and Hygiene: Independent   Tub/ Engineer, structural: Walk-in shower;Supervision/safety   Functional mobility during ADLs: Independent       Vision   Vision Assessment?: No apparent visual deficits     Perception Perception: Not tested       Praxis Praxis: Not tested       Pertinent Vitals/Pain Pain Assessment Pain Assessment: Faces Pain Score: 4  Faces Pain Scale: Hurts little more Pain Location: LLE incision Pain Descriptors / Indicators: Discomfort Pain Intervention(s): Limited activity within patient's tolerance, Monitored during session, Repositioned     Extremity/Trunk Assessment Upper Extremity Assessment Upper Extremity Assessment: Overall WFL for tasks assessed   Lower Extremity Assessment Lower Extremity Assessment: Defer to PT evaluation   Cervical / Trunk Assessment Cervical / Trunk Assessment: Normal   Communication Communication Communication: No apparent difficulties   Cognition Arousal: Alert Behavior During Therapy: WFL for tasks assessed/performed Overall Cognitive Status: Within Functional Limits for tasks assessed  General Comments: overall WFL for basic ADL tasks     General Comments  VSS on RA, spouse present    Exercises     Shoulder Instructions      Home Living Family/patient expects to be discharged to:: Private residence Living Arrangements: Spouse/significant other;Other relatives Available Help at Discharge: Family;Available PRN/intermittently Type of Home: House Home Access: Ramped  entrance     Home Layout: One level     Bathroom Shower/Tub: Producer, television/film/video: Handicapped height     Home Equipment: Grab bars - tub/shower;Shower seat          Prior Functioning/Environment Prior Level of Function : Independent/Modified Independent;Working/employed;Driving             Mobility Comments: no AD ADLs Comments: Independent, drives a truck for work (locally)        OT Problem List: Decreased activity tolerance;Decreased strength;Decreased range of motion      OT Treatment/Interventions: Self-care/ADL training;Therapeutic exercise;Energy conservation;DME and/or AE instruction;Therapeutic activities;Balance training;Patient/family education    OT Goals(Current goals can be found in the care plan section) Acute Rehab OT Goals Patient Stated Goal: none stated OT Goal Formulation: With patient Time For Goal Achievement: 07/15/23 Potential to Achieve Goals: Good ADL Goals Pt Will Perform Tub/Shower Transfer: Shower transfer;Independently;ambulating Additional ADL Goal #1: pt will perform standing functional task x10 min in order to improve activity tolerance for ADLs  OT Frequency: Min 1X/week    Co-evaluation              AM-PAC OT "6 Clicks" Daily Activity     Outcome Measure Help from another person eating meals?: None Help from another person taking care of personal grooming?: None Help from another person toileting, which includes using toliet, bedpan, or urinal?: None Help from another person bathing (including washing, rinsing, drying)?: A Little Help from another person to put on and taking off regular upper body clothing?: A Little Help from another person to put on and taking off regular lower body clothing?: A Little 6 Click Score: 21   End of Session Nurse Communication: Mobility status  Activity Tolerance: Patient tolerated treatment well Patient left: in bed;with call bell/phone within reach;with family/visitor  present  OT Visit Diagnosis: Muscle weakness (generalized) (M62.81)                Time: 1324-4010 OT Time Calculation (min): 14 min Charges:  OT General Charges $OT Visit: 1 Visit OT Evaluation $OT Eval Low Complexity: 1 Low  Pamala Hayman K, OTD, OTR/L SecureChat Preferred Acute Rehab [phone]   Kyler Lerette K Koonce 07/01/2023, 12:01 PM

## 2023-07-01 NOTE — Anesthesia Postprocedure Evaluation (Signed)
Anesthesia Post Note  Patient: Eddie Chan  Procedure(s) Performed: LEFT FEMORAL-BELOW KNEE POPLITEAL ARTERY BYPASS WITH x 80CM RINGED PTFE (Left: Leg Upper) APPLICATION OF WOUND VAC (Left: Groin)     Patient location during evaluation: PACU Anesthesia Type: General Level of consciousness: awake and alert Pain management: pain level controlled Vital Signs Assessment: post-procedure vital signs reviewed and stable Respiratory status: spontaneous breathing, nonlabored ventilation, respiratory function stable and patient connected to nasal cannula oxygen Cardiovascular status: blood pressure returned to baseline and stable Postop Assessment: no apparent nausea or vomiting Anesthetic complications: no   No notable events documented.  Last Vitals:  Vitals:   07/01/23 0322 07/01/23 0827  BP: 127/76 126/74  Pulse: 78 82  Resp:  17  Temp: 36.7 C 36.7 C  SpO2:  96%    Last Pain:  Vitals:   07/01/23 0828  TempSrc:   PainSc: 0-No pain                 [place]

## 2023-07-02 ENCOUNTER — Telehealth: Payer: Self-pay

## 2023-07-02 MED ORDER — OXYCODONE-ACETAMINOPHEN 5-325 MG PO TABS: 1.0000 | ORAL_TABLET | Freq: Four times a day (QID) | ORAL | 0 refills | Status: DC | PRN

## 2023-07-02 NOTE — Discharge Instructions (Signed)
 [facility]  Discharge instructions  Lower Extremity Bypass Surgery  Please refer to the following instruction for your post-procedure care. Your surgeon or physician assistant will discuss any changes with you.  Activity  You are encouraged to walk as much as you can. You can slowly return to normal activities during the month after your surgery. Avoid strenuous activity and heavy lifting until your doctor tells you it's OK. Avoid activities such as vacuuming or swinging a golf club. Do not drive until your doctor give the OK and you are no longer taking prescription pain medications. It is also normal to have difficulty with sleep habits, eating and bowel movement after surgery. These will go away with time.  Bathing/Showering  Shower daily after you go home. Do not soak in a bathtub, hot tub, or swim until the incision heals completely.  Incision Care  Clean your incision with mild soap and water. Shower every day. Pat the area dry with a clean towel. You do not need a bandage unless otherwise instructed. Do not apply any ointments or creams to your incision. If you have open wounds you will be instructed how to care for them or a visiting nurse may be arranged for you. If you have staples or sutures along your incision they will be removed at your post-op appointment. You may have skin glue on your incision. Do not peel it off. It will come off on its own in about one week.  Keep Pravena wound vac on your groin incision until it loses it seal in about 7-10 days.  Once that happens, you can remove and then wash the groin wound with soap and water daily and pat dry. (No tub bath-only shower)  Then put a dry gauze or washcloth in the groin to keep this area dry to help prevent wound infection.  Do this daily and as needed.  Do not use Vaseline or neosporin on your incisions.  Only use soap and water on your incisions and then protect and keep  dry.   Diet  Resume your normal diet. There are no special food restrictions following this procedure. A low fat/ low cholesterol diet is recommended for all patients with vascular disease. In order to heal from your surgery, it is CRITICAL to get adequate nutrition. Your body requires vitamins, minerals, and protein. Vegetables are the best source of vitamins and minerals. Vegetables also provide the perfect balance of protein. Processed food has little nutritional value, so try to avoid this.  Medications  Resume taking all your medications unless your doctor or physician assistant tells you not to. If your incision is causing pain, you may take over-the-counter pain relievers such as acetaminophen (Tylenol). If you were prescribed a stronger pain medication, please aware these medication can cause nausea and constipation. Prevent nausea by taking the medication with a snack or meal. Avoid constipation by drinking plenty of fluids and eating foods with high amount of fiber, such as fruits, vegetables, and grains. Take Colace 100 mg (an over-the-counter stool softener) twice a day as needed for constipation.  Do not take Tylenol if you are taking prescription pain medications.  Follow Up  Our office will schedule a follow up appointment 2-3 weeks following discharge.  Please call us immediately for any of the following conditions  Severe or worsening pain in your legs or feet while at rest or while walking Increase pain, redness, warmth, or drainage (pus) from your incision site(s) Fever of 101   degree or higher The swelling in your leg with the bypass suddenly worsens and becomes more painful than when you were in the hospital If you have been instructed to feel your graft pulse then you should do so every day. If you can no longer feel this pulse, call the office immediately. Not all patients are given this instruction.  Leg swelling is common after leg bypass surgery.  The swelling should  improve over a few months following surgery. To improve the swelling, you may elevate your legs above the level of your heart while you are sitting or resting. Your surgeon or physician assistant may ask you to apply an ACE wrap or wear compression (TED) stockings to help to reduce swelling.  Reduce your risk of vascular disease  Stop smoking. If you would like help call [facility] at 1-800-QUIT-NOW (1-800-784-8669) or [place] at 336-586-4000.  Manage your cholesterol Maintain a desired weight Control your diabetes weight Control your diabetes Keep your blood pressure down  If you have any questions, please call the office at [phone]  

## 2023-07-02 NOTE — Progress Notes (Signed)
Discharge instructions (including medications) discussed with and copy provided to patient/caregiver  Patient is going home with wound vac and instructed to remove dressing if it stops working once he is home.

## 2023-07-02 NOTE — Plan of Care (Signed)
  Problem: Acute Rehab OT Goals (only OT should resolve) Goal: OT Additional ADL Goal #1 Outcome: Adequate for Discharge Goal: OT Additional ADL Goal #2 Outcome: Adequate for Discharge   Problem: Acute Rehab OT Goals (only OT should resolve) Goal: Pt. Will Perform Tub/Shower Transfer Outcome: Adequate for Discharge   Problem: Education: Goal: Knowledge of General Education information will improve Description: Including pain rating scale, medication(s)/side effects and non-pharmacologic comfort measures Outcome: Adequate for Discharge   Problem: Health Behavior/Discharge Planning: Goal: Ability to manage health-related needs will improve Outcome: Adequate for Discharge   Problem: Clinical Measurements: Goal: Ability to maintain clinical measurements within normal limits will improve Outcome: Adequate for Discharge Goal: Will remain free from infection Outcome: Adequate for Discharge Goal: Diagnostic test results will improve Outcome: Adequate for Discharge Goal: Respiratory complications will improve Outcome: Adequate for Discharge Goal: Cardiovascular complication will be avoided Outcome: Adequate for Discharge   Problem: Activity: Goal: Risk for activity intolerance will decrease Outcome: Adequate for Discharge   Problem: Nutrition: Goal: Adequate nutrition will be maintained Outcome: Adequate for Discharge   Problem: Coping: Goal: Level of anxiety will decrease Outcome: Adequate for Discharge   Problem: Elimination: Goal: Will not experience complications related to bowel motility Outcome: Adequate for Discharge Goal: Will not experience complications related to urinary retention Outcome: Adequate for Discharge   Problem: Pain Managment: Goal: General experience of comfort will improve Outcome: Adequate for Discharge   Problem: Safety: Goal: Ability to remain free from injury will improve Outcome: Adequate for Discharge   Problem: Skin Integrity: Goal:  Risk for impaired skin integrity will decrease Outcome: Adequate for Discharge

## 2023-07-02 NOTE — Progress Notes (Signed)
  Progress Note    07/02/2023 7:41 AM 2 Days Post-Op  Subjective:  no complaints, ready to go home   Vitals:   07/01/23 2313 07/02/23 0357  BP: 122/68 127/75  Pulse:    Resp: 20 16  Temp: 97.9 F (36.6 C) 98.4 F (36.9 C)  SpO2: 96% 97%   Physical Exam: Cardiac:  regular Lungs:  non labored Incisions:  left groin with Prevena VAC to suction, left bk pop incision intact and well appearing Extremities:  2+ left PT pulse Abdomen:  soft Neurologic: alert and oriented  CBC    Component Value Date/Time   WBC 10.1 07/01/2023 0000   RBC 3.16 (L) 07/01/2023 0000   HGB 10.3 (L) 07/01/2023 0000   HCT 30.3 (L) 07/01/2023 0000   PLT 122 (L) 07/01/2023 0000   MCV 95.9 07/01/2023 0000   MCH 32.6 07/01/2023 0000   MCHC 34.0 07/01/2023 0000   RDW 12.6 07/01/2023 0000    BMET    Component Value Date/Time   NA 134 (L) 07/01/2023 0000   K 4.2 07/01/2023 0000   CL 96 (L) 07/01/2023 0000   CO2 27 07/01/2023 0000   GLUCOSE 205 (H) 07/01/2023 0000   BUN 11 07/01/2023 0000   CREATININE 0.69 07/01/2023 0000   CREATININE 0.83 05/25/2020 1448   CALCIUM 8.9 07/01/2023 0000   GFRNONAA >60 07/01/2023 0000    INR    Component Value Date/Time   INR 0.9 06/30/2023 0946     Intake/Output Summary (Last 24 hours) at 07/02/2023 0741 Last data filed at 07/01/2023 2300 Gross per 24 hour  Intake 840 ml  Output 300 ml  Net 540 ml     Assessment/Plan:  62 y.o. male is s/p left fem to BK popliteal PTFE bypass graft 2 Days Post-Op   Left groin with prevena vac to suction Left BK pop incision is clean, dry and intact Palpable PT pulse Provided patient with instructions on Prevena VAC Aspirin, Plavix, Statin He will follow up in 2-3 weeks for incision check   Graceann Congress, PA-C [facility] [phone] 07/02/2023 7:41 AM

## 2023-07-02 NOTE — Progress Notes (Signed)
While getting dressed pt reported he heard a "pop" from his praveena wound vac. On RN assessment, air leak alarm beeping. RN reinforced with tegaderm dressing with no improvement. Graceann Congress, PA notified and instructed RN to remove praveena wound vac and apply mepilex foam dressing.   Praveena wound vac now working. Graceann Congress, PA notified and instructed to leave in place. Pt instructed to remove as directed by Graceann Congress, PA. Pt verbalized understanding.

## 2023-07-02 NOTE — TOC Transition Note (Signed)
Transition of Care (TOC) - CM/SW Discharge Note Donn Pierini RN, BSN Transitions of Care Unit 4E- RN Case Manager See Treatment Team for direct phone #   Patient Details  Name: Eddie Chan MRN: [id number] Date of Birth: Apr 01, 1961  Transition of Care [facility]) CM/SW Contact:  Darrold Span, RN Phone Number: 07/02/2023, 10:14 AM   Clinical Narrative:    Pt stable for transition home today, wife to transport home. Pt will go home with Prevena wound VAC- to follow up as per AVS instructions. No HH or DME needs noted.     Final next level of care: Home/Self Care Barriers to Discharge: No Barriers Identified   Patient Goals and CMS Choice   Choice offered to / list presented to : NA  Discharge Placement               Home          Discharge Plan and Services Additional resources added to the After Visit Summary for   In-house Referral: NA Discharge Planning Services: NA Post Acute Care Choice: NA          DME Arranged: N/A DME Agency: NA       HH Arranged: NA HH Agency: NA        Social Determinants of Health (SDOH) Interventions SDOH Screenings   Depression (PHQ2-9): Low Risk  (10/14/2022)  Tobacco Use: High Risk (06/30/2023)     Readmission Risk Interventions     No data to display

## 2023-07-02 NOTE — Telephone Encounter (Signed)
Pt's wife, Rosey Bath, called requesting a prescription for Valium so pt could rest more.  Reviewed pt's chart, spoke to Dr. Edilia Bo, who advised against the prescription. Returned call, two identifiers used. Informed pt's wife and reassured her that he should begin feeling better each day and he could take additional Tylenol, not to exceed 4000 mg for pain. Confirmed understanding.

## 2023-07-03 ENCOUNTER — Telehealth: Payer: Self-pay

## 2023-07-03 ENCOUNTER — Telehealth: Payer: Self-pay | Admitting: Vascular Surgery

## 2023-07-03 NOTE — Telephone Encounter (Signed)
-----   Message from [name] sent at 07/02/2023  3:20 PM EDT ----- Regarding: duplex AAA When this patient comes in for a follow-up visit he needs a duplex of his abdominal aorta.  On his angiogram he has a small abdominal aortic aneurysm and needs a baseline study at some point.  Thank you. CD

## 2023-07-03 NOTE — Telephone Encounter (Signed)
Pt's wife, Rosey Bath, called stating that the wound vac is leaking.  Reviewed pt's chart, returned call for clarification, two identifiers used. Wound vac has lost a good seal and is beeping. Pt has been applying paper tape with no success. Instructed her to get some medical tape at the pharmacy and try to reinforce around the drape. She states that she is at work and won't get off until 11 PM. Pt is unable to drive and there is no one else who can drive him or go to the store. Instructed her to call the number on the vac or possible go to a local urgent care or ED to have a new drape applied. Confirmed understanding.

## 2023-07-06 ENCOUNTER — Other Ambulatory Visit: Payer: Self-pay | Admitting: *Deleted

## 2023-07-06 ENCOUNTER — Telehealth: Payer: Self-pay

## 2023-07-06 NOTE — Telephone Encounter (Signed)
All appts scheduled.

## 2023-07-06 NOTE — Discharge Summary (Signed)
Bypass Discharge Summary Patient ID: Eddie Chan [id number] 62 y.o. [birth year]  Admit date: 06/30/2023  Discharge date and time: 07/02/2023 10:16 AM   Admitting Physician: Chuck Hint, MD   Discharge Physician: Chuck Hint, MD  Admission Diagnoses: Status post femoral-popliteal bypass surgery [Z95.828] PAD (peripheral artery disease) [facility]) [I73.9]  Discharge Diagnoses: Status post femoral-popliteal bypass surgery [Z95.828] PAD (peripheral artery disease) (HCC) [I73.9]  Admission Condition: fair  Discharged Condition: good  Indication for Admission: Eddie Chan is a 62 y.o. male who presented with critical limb ischemia.  He had rest pain of the left lower extremity.  He had undergone a previous long iliofemoral endarterectomy extending onto the superficial femoral artery with bovine pericardial patch angioplasty.  He had an occluded popliteal artery down to the level of the knee.  I felt his best option for revascularization was infrainguinal bypass.   Hospital Course: Mr. Shamah was admitted on 06/30/23 and underwent 1. Left common femoral artery exposure 2. Left common femoral to below-knee popliteal artery bypass (6 mm ringed PTFE) by Dr. Edilia Bo. He tolerated the procedure well and was taken to the recovery room in stable condition.   POD#1 his incisions remained intact and well appearing. Left lower extremity well perfused with palpable PT pulse. Prevena VAC in the left groin with good seal. Tolerated diet. Tolerated ambulation. PT/OT evaluated him and no follow up was recommended. He remained hemodynamically stable  POD#2 continued adequate perfusion of left lower extremity. Palpable PT pulse. He will discharge  home with Prevena VAC in the left groin. Provided with instructions regarding Prevena Care and removal. He remained stable for discharge home. He will continue Aspirin and Statin and Plavix. He will follow up in 2-3 weeks for incision check in our  office.   Consults: None  Treatments: antibiotics: vancomycin and Zosyn, analgesia: Oxycodone, anticoagulation: ASA and Plavix, therapies: PT, OT, RN, and SW, and surgery: 1. Left common femoral artery exposure 2. Left common femoral to below-knee popliteal artery bypass (6 mm ringed PTFE)   Disposition: Discharge disposition: 01-Home or Self Care       - For [facility] Registry use ---  Post-op:  Wound infection: No  Graft infection: No  Transfusion: No  If yes, 0 units given New Arrhythmia: No Patency judged by: [ ]  Dopper only, [ ]  Palpable graft pulse, [ X] Palpable distal pulse, [ ]  ABI inc. > 0.15, [ ]  Duplex D/C Ambulatory Status: Ambulatory  Complications: MI: [ X] No, [ ]  Troponin only, [ ]  EKG or Clinical CHF: No Resp failure: Arly.Keller ] none, [ ]  Pneumonia, [ ]  Ventilator Chg in renal function: Arly.Keller ] none, [ ]  Inc. Cr > 0.5, [ ]  Temp. Dialysis, [ ]  Permanent dialysis Stroke: Arly.Keller ] None, [ ]  Minor, [ ]  Major Return to OR: No  Reason for return to OR: [ ]  Bleeding, [ ]  Infection, [ ]  Thrombosis, [ ]  Revision  Discharge medications: Statin use:  Yes ASA use:  Yes Plavix use:  Yes Beta blocker use: No, not indicated Coumadin use: No  for medical reason not indicated    Patient Instructions:  Allergies as of 07/02/2023       Reactions   Penicillins Other (See Comments)   Break out Childhood Reaction         Medication List     TAKE these medications    acetaminophen 500 MG tablet Commonly known as: TYLENOL Take 1,000 mg by mouth 2 (two) times daily.  albuterol (2.5 MG/3ML) 0.083% nebulizer solution Commonly known as: PROVENTIL INHALE 3 ML BY NEBULIZATION EVERY 6 HOURS AS NEEDED FOR WHEEZING OR SHORTNESS OF BREATH   albuterol 108 (90 Base) MCG/ACT inhaler Commonly known as: VENTOLIN HFA INHALE 1 PUFF INTO THE LUNGS EVERY 6 HOURS AS NEEDED FOR WHEEZING OR SHORTNESS OF BREATH.   aspirin EC 81 MG tablet Take 81 mg by mouth daily. Swallow whole.   clopidogrel  75 MG tablet Commonly known as: PLAVIX Take 1 tablet (75 mg total) by mouth daily.   oxyCODONE-acetaminophen 5-325 MG tablet Commonly known as: Percocet Take 1 tablet by mouth every 6 (six) hours as needed for severe pain.   rosuvastatin 20 MG tablet Commonly known as: Crestor Take 1 tablet (20 mg total) by mouth daily.   tetrahydrozoline 0.05 % ophthalmic solution Place 2 drops into both eyes daily as needed (red eyes).               Discharge Care Instructions  (From admission, onward)           Start     Ordered   07/02/23 0000  Discharge wound care:       Comments: Keep Pravena wound vac on your groin incision until it loses it seal in about 7-10 days.  Once that happens, you can remove and then wash the groin wound with soap and water daily and pat dry. (No tub bath-only shower)  Then put a dry gauze or washcloth in the groin to keep this area dry to help prevent wound infection.  Do this daily and as needed.  Do not use Vaseline or neosporin on your incisions.  Only use soap and water on your incisions and then protect and keep dry.   07/02/23 0748           Activity: activity as tolerated, no driving while on analgesics, and no heavy lifting for 4-6 weeks Diet: low fat, low cholesterol diet Wound Care: Keep Pravena wound vac on your groin incision until it loses it seal in about 7-10 days.  Once that happens, you can remove and then wash the groin wound with soap and water daily and pat dry. (No tub bath-only shower)  Then put a dry gauze or washcloth in the groin to keep this area dry to help prevent wound infection.  Do this daily and as needed.  Do not use Vaseline or neosporin on your incisions.  Only use soap and water on your incisions and then protect and keep dry  Follow-up with [facility] in 2-3 weeks  Signed: Arrick Dutton 07/06/2023 3:22 AM

## 2023-07-06 NOTE — Transitions of Care (Post Inpatient/ED Visit) (Signed)
   07/06/2023  Name: Eddie Chan MRN: [id number] DOB: 02-15-1961  Today's TOC FU Call Status: Today's TOC FU Call Status:: Unsuccessful Call (1st Attempt) Unsuccessful Call (1st Attempt) Date: 07/02/23  Attempted to reach the patient regarding the most recent Inpatient/ED visit.  Follow Up Plan: Additional outreach attempts will be made to reach the patient to complete the Transitions of Care (Post Inpatient/ED visit) call.   Irving Shows [facility], BSN [place]/ Ambulatory Care Management [phone]

## 2023-07-06 NOTE — Telephone Encounter (Addendum)
Caller: Pt's wife, Rosey Bath  Concern: foot, ankle, lower calf swollen, warmer than other leg, pain, itching, numbness, L groin lump approx size of a 1/2 egg that is tender to palpation  Pt is actively walking, elevating in recliner  Pt denies any blistering or open wounds. Wife states incision looks great, wound vac off, no infection symptoms  Location: left leg from knee to toes  Description:  since surgery on 9/3  Aggravating Factors: movement, walking, touching  Quality: similar to prior episodes  Treatments:  instructed to elevate more effectively, pt taking less narcotic pain meds  Procedure: Vascular Bypass  Resolution: Appointment scheduled for day when wife is off work and can transport him  Next Appt: Appointment scheduled for 9/11 @ 1015 with PA

## 2023-07-07 NOTE — Progress Notes (Unsigned)
POST OPERATIVE OFFICE NOTE    CC:  F/u for surgery  HPI:  This is a 62 y.o. male who is s/p left CFA to BK popliteal artery bypass with 6mm ringed PTFE on 06/30/2023 by Dr. 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    He was discharged on POD 2 on asa/statin/plavix.  He had a palpable left PT pulse.  He also had Prevena vac in place.   Pt returns today for follow up.  Pt states he is having left leg swelling and pain in the left foot and having trouble putting on socks.  He states the Prevena sponge came off in about 4 days.  After it was removed, he noticed some swelling in the distal medial portion of the incision.  He denies any testicular swelling.  He states he has been active at home with walking in his carport with the walker.  He will sit down and rest for a while and then continue to walk some more. He states that when he elevates his leg, he does get a little relief with the pain in the left foot.  He states that he has been taking 1/2 pain pill in the morning and evening and this also helps with his pain in the left foot.    Allergies  Allergen Reactions   Penicillins Other (See Comments)    Break out Childhood Reaction     Current Outpatient Medications  Medication Sig Dispense Refill   acetaminophen (TYLENOL) 500 MG tablet Take 1,000 mg by mouth 2 (two) times daily.     albuterol (PROVENTIL) (2.5 MG/3ML) 0.083% nebulizer solution INHALE 3 ML BY NEBULIZATION EVERY 6 HOURS AS NEEDED FOR WHEEZING OR SHORTNESS OF BREATH 150 mL 1   albuterol (VENTOLIN HFA) 108 (90 Base) MCG/ACT inhaler INHALE 1 PUFF INTO THE LUNGS EVERY 6 HOURS AS NEEDED FOR WHEEZING OR SHORTNESS OF BREATH. 6.7 each 2   aspirin EC 81 MG tablet Take 81 mg by mouth daily. Swallow whole.     clopidogrel (PLAVIX) 75 MG tablet Take 1 tablet (75 mg total) by mouth daily. 90 tablet 0   oxyCODONE-acetaminophen (PERCOCET) 5-325 MG tablet Take 1 tablet by mouth every 6 (six) hours as needed for severe pain. 20 tablet 0   rosuvastatin  (CRESTOR) 20 MG tablet Take 1 tablet (20 mg total) by mouth daily. 90 tablet 3   tetrahydrozoline 0.05 % ophthalmic solution Place 2 drops into both eyes daily as needed (red eyes).     No current facility-administered medications for this visit.     ROS:  See HPI  Physical Exam:  Today's Vitals   07/08/23 1015  BP: (!) 167/75  Pulse: 68  Temp: 97.9 F (36.6 C)  TempSrc: Temporal  SpO2: 100%  Weight: 140 lb (63.5 kg)  PainSc: 6    Body mass index is 20.67 kg/m.   Incision:  left groin incision is healing nicely.  He does have a hematoma in the distal medial portion of the incision on the upper thigh.  His lower leg incision is healing nicely.   Extremities:  brisk doppler flow left DP/PT/peroneal.  He does have some swelling in the left lower leg and foot and foot is painful to touch.      Assessment/Plan:  This is a 62 y.o. male who is s/p: left CFA to BK popliteal artery bypass with 6mm ringed PTFE on 06/30/2023 by Dr. 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    -pt with patent bypass graft with  brisk doppler flow left DP/PT/peroneal. He does have reperfusion swelling, which is not unexpected.  He does have pain in the left foot.  I discussed with Dr. Randie Heinz and will wrap his foot with ace wrap from foot to knee today.  I gave him handout to show proper way to elevate to help with swelling.  He will also continue to mobilize when he is not elevating.  He is getting some relief with 1/2 tablet of percocet in the morning and evening.  I have sent a refill for Percocet 5/325 q6h prn pain  #10 no refill. -will have him f/u next week when Dr. Edilia Bo is in the office to check his progress.  He is scheduled for follow up with arterial duplex and ABI in a couple of weeks, but this may need to be cancelled at his next visit if he is still having pain as he will not be able to tolerate but will re-evaluate at that time.  -he is compliant with his asa/statin/plavix -I wrapped his left left from his  toes to knees with mild to moderate compression with ace wrap.  He was also given 6" ace wrap if he can tolerate the wrap to mid thigh.   -also discussed keeping groin incision dry unless in the shower to help prevent wound infection.  He expressed understanding.   Doreatha Massed, [facility] [phone]   Clinic MD:  Randie Heinz

## 2023-07-08 ENCOUNTER — Other Ambulatory Visit: Payer: Self-pay | Admitting: *Deleted

## 2023-07-08 ENCOUNTER — Ambulatory Visit (INDEPENDENT_AMBULATORY_CARE_PROVIDER_SITE_OTHER): Payer: BLUE CROSS/BLUE SHIELD

## 2023-07-08 ENCOUNTER — Encounter: Payer: Self-pay | Admitting: *Deleted

## 2023-07-08 ENCOUNTER — Telehealth: Payer: Self-pay

## 2023-07-08 VITALS — BP 167/75 | HR 68 | Temp 97.9°F | Wt 140.0 lb

## 2023-07-08 DIAGNOSIS — Z95828 Presence of other vascular implants and grafts: Secondary | ICD-10-CM

## 2023-07-08 DIAGNOSIS — I70229 Atherosclerosis of native arteries of extremities with rest pain, unspecified extremity: Secondary | ICD-10-CM

## 2023-07-08 MED ORDER — OXYCODONE-ACETAMINOPHEN 5-325 MG PO TABS: 1.0000 | ORAL_TABLET | Freq: Four times a day (QID) | ORAL | 0 refills | Status: DC | PRN

## 2023-07-08 NOTE — Transitions of Care (Post Inpatient/ED Visit) (Signed)
07/08/2023  Name: Eddie Chan MRN: [id number] DOB: [birth year]  Today's TOC FU Call Status: Today's TOC FU Call Status:: Successful TOC FU Call Completed TOC FU Call Complete Date: 07/08/23 Patient's Name and Date of Birth confirmed.  Transition Care Management Follow-up Telephone Call Date of Discharge: 07/02/23 Discharge Facility: [facility]) Type of Discharge: Inpatient Admission Primary Inpatient Discharge Diagnosis:: Fem-pop bypass How have you been since you were released from the hospital?: Better Any questions or concerns?: No  Items Reviewed: Did you receive and understand the discharge instructions provided?: Yes Medications obtained,verified, and reconciled?: Yes (Medications Reviewed) Any new allergies since your discharge?: No Dietary orders reviewed?: Yes Type of Diet Ordered:: heart healthy Do you have support at home?: Yes People in Home: spouse Name of Support/Comfort Primary Source: Rosey Bath  Medications Reviewed Today: Medications Reviewed Today     Reviewed by Audrie Gallus, RN (Registered Nurse) on 07/08/23 at 1633  Med List Status: <None>   Medication Order Taking? Sig Documenting Provider Last Dose Status Informant  acetaminophen (TYLENOL) 500 MG tablet [id number] Yes Take 1,000 mg by mouth 2 (two) times daily. [provider] Taking Active Spouse/Significant Other  albuterol (PROVENTIL) (2.5 MG/3ML) 0.083% nebulizer solution [id number] Yes INHALE 3 ML BY NEBULIZATION EVERY 6 HOURS AS NEEDED FOR WHEEZING OR SHORTNESS OF BREATH Myrlene Broker, MD Taking Active Spouse/Significant Other  albuterol (VENTOLIN HFA) 108 (90 Base) MCG/ACT inhaler [id number] Yes INHALE 1 PUFF INTO THE LUNGS EVERY 6 HOURS AS NEEDED FOR WHEEZING OR SHORTNESS OF BREATH. Myrlene Broker, MD Taking Active Spouse/Significant Other  aspirin EC 81 MG tablet [id number] Yes Take 81 mg by mouth daily. Swallow whole. [provider] Taking Active  Spouse/Significant Other  clopidogrel (PLAVIX) 75 MG tablet [id number] Yes Take 1 tablet (75 mg total) by mouth daily. Myrlene Broker, MD Taking Active Spouse/Significant Other  oxyCODONE-acetaminophen (PERCOCET) 5-325 MG tablet [id number] Yes Take 1 tablet by mouth every 6 (six) hours as needed for severe pain. Dara Lords, PA-C Taking Active   rosuvastatin (CRESTOR) 20 MG tablet [id number] Yes Take 1 tablet (20 mg total) by mouth daily. Dara Lords, PA-C Taking Active Spouse/Significant Other  tetrahydrozoline 0.05 % ophthalmic solution [id number] Yes Place 2 drops into both eyes daily as needed (red eyes). [provider] Taking Active Spouse/Significant Other            Home Care and Equipment/Supplies: Were Home Health Services Ordered?: No Any new equipment or medical supplies ordered?: No  Functional Questionnaire: Do you need assistance with bathing/showering or dressing?: No Do you need assistance with meal preparation?: No Do you need assistance with eating?: No Do you have difficulty maintaining continence: No Do you need assistance with getting out of bed/getting out of a chair/moving?: No Do you have difficulty managing or taking your medications?: No  Follow up appointments reviewed: PCP Follow-up appointment confirmed?: No (pt states he will make appointment to see PCP, saw vascular surgeon today and will follow up again next week, feels this is most important appointments at present) MD Provider Line [phone] Given: No Specialist Hospital Follow-up appointment confirmed?: Yes Date of Specialist follow-up appointment?: 07/08/23 Follow-Up Specialty Provider:: vascular surgeon Do you need transportation to your follow-up appointment?: No Do you understand care options if your condition(s) worsen?: Yes-patient verbalized understanding  SDOH Interventions Today    Flowsheet Row Most Recent Value  SDOH Interventions   Food Insecurity  Interventions Intervention Not Indicated  Housing Interventions Intervention Not Indicated  Transportation Interventions Intervention Not Indicated  Utilities Interventions Intervention Not Indicated       Irving Shows [facility], BSN [facility]/ Ambulatory Care Management [phone]

## 2023-07-09 ENCOUNTER — Other Ambulatory Visit: Payer: Self-pay

## 2023-07-09 DIAGNOSIS — I714 Abdominal aortic aneurysm, without rupture, unspecified: Secondary | ICD-10-CM

## 2023-07-09 NOTE — Telephone Encounter (Signed)
Opened in error

## 2023-07-15 ENCOUNTER — Ambulatory Visit (INDEPENDENT_AMBULATORY_CARE_PROVIDER_SITE_OTHER): Payer: BLUE CROSS/BLUE SHIELD | Admitting: Physician Assistant

## 2023-07-15 VITALS — BP 127/76 | HR 75 | Temp 98.4°F | Wt 140.0 lb

## 2023-07-15 DIAGNOSIS — I739 Peripheral vascular disease, unspecified: Secondary | ICD-10-CM

## 2023-07-15 DIAGNOSIS — Z95828 Presence of other vascular implants and grafts: Secondary | ICD-10-CM

## 2023-07-21 NOTE — Progress Notes (Signed)
POST OPERATIVE OFFICE NOTE    CC:  F/u for surgery  HPI: Eddie Chan is a 62 y.o. male who is s/p left common femoral artery to below-knee popliteal artery bypass with PTFE on 06/30/2023 by Dr. Edilia Bo.  This was done for critical limb ischemia with rest pain.  At his first follow-up with our office a week ago, the patient was having significant left lower extremity pain and swelling.  He was staying fairly active and did not have any claudication or rest pain.  His pain was thought to be due to swelling.  He returns today for repeat postop check.  His left lower extremity incisions have still been healing well.  He continues to have issues with left lower extremity swelling, which is being controlled decently with leg elevation and compression stockings.  He is still able to walk without pain.  Most of his pain is still in the left foot, which is worsened as his left leg swells more or if he was on his feet a lot that day.  Overall his left foot is very sensitive to touch.  His left foot pain improves with leg elevation.   Allergies  Allergen Reactions   Penicillins Other (See Comments)    Break out Childhood Reaction     Current Outpatient Medications  Medication Sig Dispense Refill   acetaminophen (TYLENOL) 500 MG tablet Take 1,000 mg by mouth 2 (two) times daily.     albuterol (PROVENTIL) (2.5 MG/3ML) 0.083% nebulizer solution INHALE 3 ML BY NEBULIZATION EVERY 6 HOURS AS NEEDED FOR WHEEZING OR SHORTNESS OF BREATH 150 mL 1   albuterol (VENTOLIN HFA) 108 (90 Base) MCG/ACT inhaler INHALE 1 PUFF INTO THE LUNGS EVERY 6 HOURS AS NEEDED FOR WHEEZING OR SHORTNESS OF BREATH. 6.7 each 2   aspirin EC 81 MG tablet Take 81 mg by mouth daily. Swallow whole.     clopidogrel (PLAVIX) 75 MG tablet Take 1 tablet (75 mg total) by mouth daily. 90 tablet 0   oxyCODONE-acetaminophen (PERCOCET) 5-325 MG tablet Take 1 tablet by mouth every 6 (six) hours as needed for severe pain. 10 tablet 0    rosuvastatin (CRESTOR) 20 MG tablet Take 1 tablet (20 mg total) by mouth daily. 90 tablet 3   tetrahydrozoline 0.05 % ophthalmic solution Place 2 drops into both eyes daily as needed (red eyes).     No current facility-administered medications for this visit.     ROS:  See HPI  Physical Exam:  Incision: Left groin and below-knee incisions well-healed without erythema or dehiscence Extremities: Left lower extremity and foot edematous.  Brisk left DP/PT/peroneal Doppler signals.  Left foot very sensitive to touch Neuro: Intact motor and sensation of left lower extremity    Assessment/Plan:  This is a 62 y.o. male who is here for a repeat postop check  -The patient is here for repeat evaluation of left lower extremity swelling and foot pain after bypass on 06/30/2023 -He continues to have left lower extremity swelling, but this is being controlled by leg elevation and compression stockings.  He also continues to have left foot pain, especially worsened with leg swelling.  His left foot pain is relieved with leg elevation -On exam his left lower extremity is neurovascularly intact.  He has brisk left DP/PT/peroneal Doppler signals. -I believe his foot pain is being caused by increased sensitivity after revascularization and reperfusion swelling.  He should continue to exercise as tolerated, wear compression stockings, and elevate his left leg for pain relief.  -  He has a follow-up with our office next week for AAA.  At that time we will see how his left lower extremity is doing   Loel Dubonnet, PA-C [facility] [phone]   Clinic MD:  Cain/Dickson

## 2023-07-23 ENCOUNTER — Ambulatory Visit (INDEPENDENT_AMBULATORY_CARE_PROVIDER_SITE_OTHER): Payer: BLUE CROSS/BLUE SHIELD | Admitting: Physician Assistant

## 2023-07-23 ENCOUNTER — Ambulatory Visit (HOSPITAL_COMMUNITY)
Admission: RE | Admit: 2023-07-23 | Discharge: 2023-07-23 | Disposition: A | Discharge status: Home / Self Care | Payer: BLUE CROSS/BLUE SHIELD | Source: Ambulatory Visit | Attending: Vascular Surgery | Admitting: Vascular Surgery

## 2023-07-23 VITALS — BP 134/78 | HR 63 | Temp 98.1°F | Resp 20 | Ht 69.0 in | Wt 129.0 lb

## 2023-07-23 DIAGNOSIS — I714 Abdominal aortic aneurysm, without rupture, unspecified: Secondary | ICD-10-CM | POA: Diagnosis present

## 2023-07-23 DIAGNOSIS — F17200 Nicotine dependence, unspecified, uncomplicated: Secondary | ICD-10-CM

## 2023-07-23 DIAGNOSIS — I739 Peripheral vascular disease, unspecified: Secondary | ICD-10-CM

## 2023-07-23 MED ORDER — OXYCODONE-ACETAMINOPHEN 5-325 MG PO TABS: 1.0000 | ORAL_TABLET | Freq: Four times a day (QID) | ORAL | 0 refills | Status: AC | PRN

## 2023-07-23 NOTE — Progress Notes (Signed)
POST OPERATIVE OFFICE NOTE    CC:  F/u for surgery  HPI:  This is a 62 y.o. male who is s/p  left CFA to BK popliteal artery bypass with 6mm ringed PTFE on 06/30/2023 by Dr. 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     He was discharged on POD 2 on asa/statin/plavix.  He had a palpable left PT pulse.  He also had Prevena vac in place.   He has been seen back in the office and continues to have pain in the left lower leg.   Pt returns today for follow up.  Pt states he continues to have pain in the left lower leg that is sharp and feels like it goes right up to the bone.  He feels like his toes are going to explode.  His swelling in the left leg is much improved as he has been elevating his legs.  He states that after a while of elevating, he has to put his leg down to the floor and it does feel better.  He has been active and walking.  He is unable to sleep due to the pain.  He is taking Tylenol with no relief.  He has tried gabapentin in the past and this made him sick.    He denies any abdominal or back pain.  He denies any pain in the right leg.    He continues to smoke but has cut back significantly.     Allergies  Allergen Reactions   Penicillins Other (See Comments)    Break out Childhood Reaction     Current Outpatient Medications  Medication Sig Dispense Refill   acetaminophen (TYLENOL) 500 MG tablet Take 1,000 mg by mouth 2 (two) times daily.     albuterol (PROVENTIL) (2.5 MG/3ML) 0.083% nebulizer solution INHALE 3 ML BY NEBULIZATION EVERY 6 HOURS AS NEEDED FOR WHEEZING OR SHORTNESS OF BREATH 150 mL 1   albuterol (VENTOLIN HFA) 108 (90 Base) MCG/ACT inhaler INHALE 1 PUFF INTO THE LUNGS EVERY 6 HOURS AS NEEDED FOR WHEEZING OR SHORTNESS OF BREATH. 6.7 each 2   aspirin EC 81 MG tablet Take 81 mg by mouth daily. Swallow whole.     clopidogrel (PLAVIX) 75 MG tablet Take 1 tablet (75 mg total) by mouth daily. 90 tablet 0   oxyCODONE-acetaminophen (PERCOCET) 5-325 MG tablet Take 1 tablet by  mouth every 6 (six) hours as needed for severe pain. 10 tablet 0   rosuvastatin (CRESTOR) 20 MG tablet Take 1 tablet (20 mg total) by mouth daily. 90 tablet 3   tetrahydrozoline 0.05 % ophthalmic solution Place 2 drops into both eyes daily as needed (red eyes).     No current facility-administered medications for this visit.     ROS:  See HPI  Physical Exam:  Today's Vitals   07/23/23 0936 07/23/23 0938  BP: 134/78   Pulse: 63   Resp: 20   Temp: 98.1 F (36.7 C)   TempSrc: Temporal   SpO2: 98%   Weight: 129 lb (58.5 kg)   Height: 5\' 9"  (1.753 m)   PainSc: 6  6   PainLoc: Leg    Body mass index is 19.05 kg/m.   Incision:  both incisions healed nicely Extremities:  easily palpable left DP pulse; swelling significantly improved LLE Abdomen:  soft; mildly palpable aortic pulse.     Assessment/Plan:  This is a 62 y.o. male who is s/p:  left CFA to BK popliteal artery bypass with 6mm ringed PTFE on 06/30/2023  by Dr. 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     CLI -pt bypass patent with easily palpable left DP pulse.   His swelling is significantly better. -he continues to have significant pain in the left leg.  I discussed with Dr. Randie Heinz and it is possible he does have some reperfusion pain or nerve pain.  He has tried Neurontin last year and this make him sick.  Tylenol is not working for his pain.  We had a long discussion and I have sent Rx for Percocet #10 tabs to his pharmacy.  Discussed that we will see him back in a couple of weeks and if he continues to have significant pain, he will need referral to pain clinic as we cannot prescribe and more narcotic pain medication.   Current smoker -discussed the importance of smoking cessation especially in light of having PTFE bypass.  Discussed that he would be at high risk for amputation, heart attack, stroke and cancers.  Strongly encouraged him to work on quitting.  He has cut back but does not want to quit.   AAA -duplex today measures  3.4cm.  will repeat in one year.  He knows to call 911 should he develop severe sudden back or abdominal pain.    Doreatha Massed, [facility] [phone]   Clinic MD:  Randie Heinz

## 2023-07-31 ENCOUNTER — Other Ambulatory Visit: Payer: Self-pay | Admitting: Internal Medicine

## 2023-08-04 ENCOUNTER — Other Ambulatory Visit: Payer: Self-pay

## 2023-08-04 DIAGNOSIS — I714 Abdominal aortic aneurysm, without rupture, unspecified: Secondary | ICD-10-CM

## 2023-08-10 ENCOUNTER — Encounter: Payer: Self-pay | Admitting: Physician Assistant

## 2023-08-10 ENCOUNTER — Ambulatory Visit (INDEPENDENT_AMBULATORY_CARE_PROVIDER_SITE_OTHER): Payer: BLUE CROSS/BLUE SHIELD | Admitting: Physician Assistant

## 2023-08-10 VITALS — BP 142/82 | HR 68 | Temp 98.3°F | Resp 18 | Ht 69.0 in | Wt 133.2 lb

## 2023-08-10 DIAGNOSIS — I739 Peripheral vascular disease, unspecified: Secondary | ICD-10-CM

## 2023-08-10 DIAGNOSIS — F17200 Nicotine dependence, unspecified, uncomplicated: Secondary | ICD-10-CM

## 2023-08-10 NOTE — Progress Notes (Signed)
POST OPERATIVE OFFICE NOTE    CC:  F/u for surgery  HPI:  This is a 62 y.o. male who is s/p  left CFA to BK popliteal artery bypass with 6mm ringed PTFE on 06/30/2023 by Dr. 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  He has previous hx of extensive endarterectomy of the left EIA, CFA and SFA with bovine pericardial patch angioplasty on 07/18/2022 by Dr. Edilia Bo.     He was discharged on POD 2 on asa/statin/plavix.  He had a palpable left PT pulse.  He also had Prevena vac in place.    He has been seen back in the office and continues to have pain in the left lower leg.   He was seen on 07/23/2023 and he was continuing to have pain in the left lower leg that was sharp and described as going right up to the bone and felt his toes were going to explode.  He was active and walking but unable to sleep due to pain.      Pt returns today for follow up.  Pt states the left leg is unchanged.  He states that he has sharp stabbing pains in the left lower leg.  He continues to have swelling that is not any worse than right after surgery.  He states that he sleeps in the recliner.  He is not taking the pain meds at night bc of the way they make him feel.  He is able to walk without difficulty and he states his foot is no longer cold.  His work shop is 375 ft from the house and he can now walk there without any difficulty.  He does not use his golf cart near as much bc he is now able to walk.   He continues to take his asa/statin/plavix.    He continues to smoke but has cut back and now only smoking 1 ppd as he was smoking 2 ppd.     Allergies  Allergen Reactions   Penicillins Other (See Comments)    Break out Childhood Reaction     Current Outpatient Medications  Medication Sig Dispense Refill   acetaminophen (TYLENOL) 500 MG tablet Take 1,000 mg by mouth 2 (two) times daily.     albuterol (PROVENTIL) (2.5 MG/3ML) 0.083% nebulizer solution INHALE 3 ML BY NEBULIZATION EVERY 6 HOURS AS NEEDED FOR WHEEZING OR  SHORTNESS OF BREATH 150 mL 1   albuterol (VENTOLIN HFA) 108 (90 Base) MCG/ACT inhaler INHALE 1 PUFF INTO THE LUNGS EVERY 6 HOURS AS NEEDED FOR WHEEZING OR SHORTNESS OF BREATH. 6.7 each 2   aspirin EC 81 MG tablet Take 81 mg by mouth daily. Swallow whole.     clopidogrel (PLAVIX) 75 MG tablet Take 1 tablet (75 mg total) by mouth daily. 90 tablet 0   oxyCODONE-acetaminophen (PERCOCET) 5-325 MG tablet Take 1 tablet by mouth every 6 (six) hours as needed for severe pain. 10 tablet 0   rosuvastatin (CRESTOR) 20 MG tablet Take 1 tablet (20 mg total) by mouth daily. 90 tablet 3   tetrahydrozoline 0.05 % ophthalmic solution Place 2 drops into both eyes daily as needed (red eyes).     No current facility-administered medications for this visit.     ROS:  See HPI  Physical Exam:  Today's Vitals   08/10/23 0827 08/10/23 0829  BP: (!) 142/82   Pulse: 68   Resp: 18   Temp: 98.3 F (36.8 C)   TempSrc: Temporal   SpO2: 97%   Weight:  133 lb 3.2 oz (60.4 kg)   Height: 5\' 9"  (1.753 m)   PainSc: 5  6   PainLoc: Leg    Body mass index is 19.67 kg/m.   Incision:  healed nicely left groin and left below knee Extremities:  easily palpable left DP pulse; 1+ edema LLE; foot tender to touch (unchanged from previous visits)     Assessment/Plan:  This is a 62 y.o. male who is s/p:  left CFA to BK popliteal artery bypass with 6mm ringed PTFE on 06/30/2023 by Dr. 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    PAD -easily palpable left DP pulse.  He is walking without difficulty without claudication.   -he has reperfusion swelling in the LLE and has pain that he describes as sharp stabbing pains.  Discussed with him that this sounds neuropathic in nature.  He is unable to take gabapentin.  Discussed referral to neurology for further workup but he does not want to pursue this currently.   -will have him f/u in 3 months with LLE bypass duplex and ABI.  He knows to call sooner if any issues before then.  -continue  asa/statin/plavix  Current smoker -again discussed with pt importance of smoking cessation.  Discussed that his bypass is synthetic and quitting smoking would help him keep this bypass patent for longer.  He has cut back to 1ppd.  Encouraged him to continue working on this.  AAA -measured 3.4cm 07/23/2023.  Plan for AAA duplex in one year.   Doreatha Massed, [facility] [phone]   Clinic MD:  Hetty Blend

## 2023-08-25 ENCOUNTER — Encounter: Payer: Self-pay | Admitting: Internal Medicine

## 2023-08-25 ENCOUNTER — Ambulatory Visit (INDEPENDENT_AMBULATORY_CARE_PROVIDER_SITE_OTHER): Payer: BLUE CROSS/BLUE SHIELD | Admitting: Internal Medicine

## 2023-08-25 VITALS — BP 130/80 | Ht 69.0 in | Wt 133.0 lb

## 2023-08-25 DIAGNOSIS — E782 Mixed hyperlipidemia: Secondary | ICD-10-CM

## 2023-08-25 DIAGNOSIS — G629 Polyneuropathy, unspecified: Secondary | ICD-10-CM | POA: Diagnosis not present

## 2023-08-25 DIAGNOSIS — F17200 Nicotine dependence, unspecified, uncomplicated: Secondary | ICD-10-CM | POA: Diagnosis not present

## 2023-08-25 DIAGNOSIS — Z0000 Encounter for general adult medical examination without abnormal findings: Secondary | ICD-10-CM

## 2023-08-25 DIAGNOSIS — I739 Peripheral vascular disease, unspecified: Secondary | ICD-10-CM

## 2023-08-25 DIAGNOSIS — J431 Panlobular emphysema: Secondary | ICD-10-CM | POA: Diagnosis not present

## 2023-08-25 LAB — TSH: TSH: 0.67 u[IU]/mL (ref 0.35–5.50)

## 2023-08-25 LAB — HEMOGLOBIN A1C: Hgb A1c MFr Bld: 5.5 % (ref 4.6–6.5)

## 2023-08-25 LAB — VITAMIN D 25 HYDROXY (VIT D DEFICIENCY, FRACTURES): VITD: 25.73 ng/mL — ABNORMAL LOW (ref 30.00–100.00)

## 2023-08-25 LAB — VITAMIN B12: Vitamin B-12: 281 pg/mL (ref 211–911)

## 2023-08-25 MED ORDER — ALBUTEROL SULFATE (2.5 MG/3ML) 0.083% IN NEBU: 2.5000 mg | INHALATION_SOLUTION | RESPIRATORY_TRACT | 1 refills | Status: DC | PRN

## 2023-08-25 MED ORDER — ASPIRIN 81 MG PO TBEC: 81.0000 mg | DELAYED_RELEASE_TABLET | Freq: Every day | ORAL | 3 refills | Status: AC

## 2023-08-25 MED ORDER — CLOPIDOGREL BISULFATE 75 MG PO TABS: 75.0000 mg | ORAL_TABLET | Freq: Every day | ORAL | 1 refills | Status: DC

## 2023-08-25 NOTE — Patient Instructions (Signed)
We will check the labs today. 

## 2023-08-25 NOTE — Progress Notes (Unsigned)
Subjective:   Patient ID: Eddie Chan, male    DOB: [birth year], 62 y.o.   MRN: [id number]  HPI The patient is a 62 YO man coming in for physical. Also having new numbness and swelling left leg after vascular procedure. Still smoking but trying to reduce.   PMH, [facility], social history reviewed and updated.  Review of Systems  Constitutional: Negative.   HENT: Negative.    Eyes: Negative.   Respiratory:  Negative for cough, chest tightness and shortness of breath.   Cardiovascular:  Positive for leg swelling. Negative for chest pain and palpitations.  Gastrointestinal:  Negative for abdominal distention, abdominal pain, constipation, diarrhea, nausea and vomiting.  Musculoskeletal: Negative.   Skin: Negative.   Neurological:  Positive for numbness.  Psychiatric/Behavioral: Negative.      Objective:  Physical Exam Constitutional:      Appearance: He is well-developed.  HENT:     Head: Normocephalic and atraumatic.  Cardiovascular:     Rate and Rhythm: Normal rate and regular rhythm.  Pulmonary:     Effort: Pulmonary effort is normal. No respiratory distress.     Breath sounds: Normal breath sounds. No wheezing or rales.  Abdominal:     General: Bowel sounds are normal. There is no distension.     Palpations: Abdomen is soft.     Tenderness: There is no abdominal tenderness. There is no rebound.  Musculoskeletal:     Cervical back: Normal range of motion.     Comments: Trace edema left ankle and lower leg with numbness in areas, pulse present at PT  Skin:    General: Skin is warm and dry.  Neurological:     Mental Status: He is alert and oriented to person, place, and time.     Coordination: Coordination normal.     Vitals:   08/25/23 1540  BP: 130/80  Weight: 133 lb (60.3 kg)  Height: 5\' 9"  (1.753 m)    Assessment & Plan:

## 2023-08-27 ENCOUNTER — Encounter: Payer: Self-pay | Admitting: Internal Medicine

## 2023-08-27 MED ORDER — ROSUVASTATIN CALCIUM 20 MG PO TABS: 20.0000 mg | ORAL_TABLET | Freq: Every day | ORAL | 3 refills | Status: DC

## 2023-08-27 NOTE — Assessment & Plan Note (Signed)
Recent lipid panel at goal continue crestor 20 mg daily.

## 2023-08-27 NOTE — Assessment & Plan Note (Signed)
Overall stable and using albuterol prn. He is still smoking and we had a long discussion about progression of lung disease and increasing dyspnea.

## 2023-08-27 NOTE — Assessment & Plan Note (Signed)
Checking B12 and vitamin D. B12 was borderline previously and he is not taking vitamins. Adjust as needed.

## 2023-08-27 NOTE — Assessment & Plan Note (Signed)
Flu shot declines. Shingrix declines. Tetanus declines. Colonoscopy declines. Counseled about sun safety and mole surveillance. Counseled about the dangers of distracted driving. Given 10 year screening recommendations.

## 2023-08-27 NOTE — Assessment & Plan Note (Signed)
New swelling and numbness which is likely associated with re-vascularization procedure. Checking B12 and vitamin D and CBC and CMP to rule out metabolic cause. Treat as appropriate.

## 2023-08-27 NOTE — Assessment & Plan Note (Signed)
Counseled to quit and he is unable at this time. Working on cutting back some.

## 2023-09-01 ENCOUNTER — Ambulatory Visit: Payer: BLUE CROSS/BLUE SHIELD | Admitting: Internal Medicine

## 2023-09-03 ENCOUNTER — Ambulatory Visit: Payer: BLUE CROSS/BLUE SHIELD | Admitting: Internal Medicine

## 2023-09-03 DIAGNOSIS — R7989 Other specified abnormal findings of blood chemistry: Secondary | ICD-10-CM

## 2023-09-03 MED ORDER — CYANOCOBALAMIN 1000 MCG/ML IJ SOLN
1000.0000 ug | INTRAMUSCULAR | Status: AC
Start: 2023-09-03 — End: 2023-10-01
  Administered 2023-09-03: 1000 ug via INTRAMUSCULAR

## 2023-09-03 NOTE — Progress Notes (Signed)
Patient presented today for first vit B12 injection, order verified by reviewing 08/25/23 labs. Ordered B12 to be given once a week for 1 month

## 2023-09-04 ENCOUNTER — Other Ambulatory Visit: Payer: Self-pay

## 2023-09-04 DIAGNOSIS — I739 Peripheral vascular disease, unspecified: Secondary | ICD-10-CM

## 2023-09-10 ENCOUNTER — Telehealth: Payer: Self-pay | Admitting: Internal Medicine

## 2023-09-10 NOTE — Telephone Encounter (Signed)
Patient's wife called and said the patient is to get weekly B12 shots. However, they live in [place] and it is difficult to come every week. They had discussed with the nurse the possibility of having the injections sent to them so they can do it at home. She said they haven't heard anything yet. Patient would like a call back at [phone].

## 2023-09-11 MED ORDER — VITAMIN B-12 1000 MCG PO TABS: 1000.0000 ug | ORAL_TABLET | Freq: Every day | ORAL | 3 refills | Status: DC

## 2023-09-11 NOTE — Telephone Encounter (Signed)
Called patient wife back and informed her the advise from the doctor and she stated she verbalized she understood and will call back to make a lab appointment to a recheck for husband

## 2023-09-11 NOTE — Telephone Encounter (Signed)
With his levels it is okay to just start oral. I have sent in rx to take daily and recheck levels in 3-6 months.

## 2023-09-17 ENCOUNTER — Telehealth: Payer: Self-pay

## 2023-09-17 NOTE — Telephone Encounter (Signed)
Caller: Pt's wife, Aggie Cosier  Concern: Constant pain, swelling in leg and foot, lost feeling in foot and nearly fell when he got up and started walking. The numbness goes up to his knee.  Location: left leg  Description:  pain and swelling worsening, numbness is new as of 09/15/23  Treatments:  pt has been elevating  Resolution: Appointment scheduled for next available triage appt after confirming with pt  Next Appt: Appointment scheduled for 11/25 @ 1400 for [place] and 1445 for PA

## 2023-09-18 ENCOUNTER — Other Ambulatory Visit (HOSPITAL_COMMUNITY): Payer: Self-pay | Admitting: Vascular Surgery

## 2023-09-18 ENCOUNTER — Ambulatory Visit (INDEPENDENT_AMBULATORY_CARE_PROVIDER_SITE_OTHER)
Admission: RE | Admit: 2023-09-18 | Discharge: 2023-09-18 | Disposition: A | Discharge status: Home / Self Care | Payer: BLUE CROSS/BLUE SHIELD | Source: Ambulatory Visit | Attending: Vascular Surgery

## 2023-09-18 ENCOUNTER — Ambulatory Visit (HOSPITAL_COMMUNITY): Payer: BLUE CROSS/BLUE SHIELD

## 2023-09-18 ENCOUNTER — Ambulatory Visit (HOSPITAL_COMMUNITY)
Admission: RE | Admit: 2023-09-18 | Discharge: 2023-09-18 | Disposition: A | Discharge status: Home / Self Care | Payer: BLUE CROSS/BLUE SHIELD | Source: Ambulatory Visit | Attending: Physician Assistant | Admitting: Physician Assistant

## 2023-09-18 ENCOUNTER — Encounter: Payer: Self-pay | Admitting: Vascular Surgery

## 2023-09-18 ENCOUNTER — Other Ambulatory Visit: Payer: Self-pay | Admitting: *Deleted

## 2023-09-18 ENCOUNTER — Ambulatory Visit (INDEPENDENT_AMBULATORY_CARE_PROVIDER_SITE_OTHER): Payer: BLUE CROSS/BLUE SHIELD | Admitting: Vascular Surgery

## 2023-09-18 VITALS — BP 127/78 | HR 71 | Temp 98.1°F | Resp 20 | Ht 69.0 in | Wt 133.0 lb

## 2023-09-18 DIAGNOSIS — I739 Peripheral vascular disease, unspecified: Secondary | ICD-10-CM | POA: Diagnosis present

## 2023-09-18 DIAGNOSIS — I70222 Atherosclerosis of native arteries of extremities with rest pain, left leg: Secondary | ICD-10-CM

## 2023-09-18 LAB — VAS US ABI WITH/WO TBI
Left ABI: 1.01
Right ABI: 0.8

## 2023-09-18 NOTE — Telephone Encounter (Signed)
Spoke with sonographer who advised pt be seen today and created an opening for him at [facility].  Called pt's wife and pt, two identifiers used. Impressed the importance of being seen today for concern of BPG being down and subsequent potential tissue loss. Pt agreed to get [place] today. Pt given address and phone number. Confirmed understanding.

## 2023-09-18 NOTE — Progress Notes (Signed)
Patient ID: Eddie Chan, male   DOB: 1961/08/11, 62 y.o.   MRN: [id number]  Reason for Consult: Follow-up   Referred by Myrlene Broker, *  Subjective:     HPI  Eddie Chan is a 62 y.o. male who underwent left common femoral to below-knee popliteal artery bypass with PTFE on June 30, 2023 by Dr. Durwin Nora for rest pain.  He called the office today due to pain and swelling in the left ankle and constant numbness. He reports he has had ongoing intermittent numbness in the foot since surgery that is propagated by prolonged sitting.  He does report he has some constant swelling in the medial left ankle associated with some pain.  He does elevate his foot at night but is not wearing any compression.  He reports he is able to walk without any claudication pain. He is still currently smoking.  Past Medical History:  Diagnosis Date   Hyperlipidemia    Peripheral arterial disease (HCC)    Family History  Problem Relation Age of Onset   Cancer Mother        lung cancer   Heart disease Father    Diabetes Father    Past Surgical History:  Procedure Laterality Date   ABDOMINAL AORTOGRAM W/LOWER EXTREMITY Bilateral 10/12/2020   Procedure: ABDOMINAL AORTOGRAM W/LOWER EXTREMITY;  Surgeon: Chuck Hint, MD;  Location: [facility] INVASIVE CV LAB;  Service: Cardiovascular;  Laterality: Bilateral;   ABDOMINAL AORTOGRAM W/LOWER EXTREMITY N/A 06/27/2022   Procedure: ABDOMINAL AORTOGRAM W/LOWER EXTREMITY;  Surgeon: Chuck Hint, MD;  Location: [facility] INVASIVE CV LAB;  Service: Cardiovascular;  Laterality: N/A;   ABDOMINAL AORTOGRAM W/LOWER EXTREMITY Bilateral 06/22/2023   Procedure: ABDOMINAL AORTOGRAM W/LOWER EXTREMITY;  Surgeon: Chuck Hint, MD;  Location[facility] INVASIVE CV LAB;  Service: Cardiovascular;  Laterality: Bilateral;   APPLICATION OF WOUND VAC Left 06/30/2023   Procedure: APPLICATION OF WOUND VAC;  Surgeon: Chuck Hint, MD;  Location: [facility] OR;  Service:  Vascular;  Laterality: Left;   ENDARTERECTOMY FEMORAL Left 07/18/2022   Procedure: LEFT COMMON FEMORAL ENDARTERECTOMY;  Surgeon: Chuck Hint, MD;  Location: [facility] OR;  Service: Vascular;  Laterality: Left;   FEMORAL-POPLITEAL BYPASS GRAFT Left 06/30/2023   Procedure: LEFT FEMORAL-BELOW KNEE POPLITEAL ARTERY BYPASS WITH x 80CM RINGED PTFE;  Surgeon: Chuck Hint, MD;  Location: [facility] OR;  Service: Vascular;  Laterality: Left;   Left iliac stenting     PATCH ANGIOPLASTY Left 07/18/2022   Procedure: PATCH ANGIOPLASTY OF LEFT FEMORAL ARTERY USING BOVINE PERICARDIUM PATCH;  Surgeon: Chuck Hint, MD;  Location: [facility] OR;  Service: Vascular;  Laterality: Left;   PERIPHERAL VASCULAR INTERVENTION Left 10/12/2020   Procedure: PERIPHERAL VASCULAR INTERVENTION;  Surgeon: Chuck Hint, MD;  Location: [facility] INVASIVE CV LAB;  Service: Cardiovascular;  Laterality: Left;  SFA   SHOULDER SURGERY Left    TONSILLECTOMY      Short Social History:  Social History   Tobacco Use   Smoking status: Every Day    Current packs/day: 1.50    Average packs/day: 1.5 packs/day for 40.0 years (60.0 ttl pk-yrs)    Types: Cigarettes   Smokeless tobacco: Never  Substance Use Topics   Alcohol use: Yes    Alcohol/week: 7.0 standard drinks of alcohol    Types: 7 Cans of beer per week    Comment: social, 2 beers nightly (12 oz)     Allergies  Allergen Reactions   Penicillins Other (See Comments)  Break out Childhood Reaction     Current Outpatient Medications  Medication Sig Dispense Refill   acetaminophen (TYLENOL) 500 MG tablet Take 1,000 mg by mouth 2 (two) times daily.     albuterol (PROVENTIL) (2.5 MG/3ML) 0.083% nebulizer solution Take 3 mLs (2.5 mg total) by nebulization every 2 (two) hours as needed for wheezing or shortness of breath. 150 mL 1   albuterol (VENTOLIN HFA) 108 (90 Base) MCG/ACT inhaler INHALE 1 PUFF INTO THE LUNGS EVERY 6 HOURS AS NEEDED FOR WHEEZING OR  SHORTNESS OF BREATH. 6.7 each 2   aspirin EC 81 MG tablet Take 1 tablet (81 mg total) by mouth daily. Swallow whole. 90 tablet 3   clopidogrel (PLAVIX) 75 MG tablet Take 1 tablet (75 mg total) by mouth daily. 90 tablet 1   cyanocobalamin (VITAMIN B12) 1000 MCG tablet Take 1 tablet (1,000 mcg total) by mouth daily. 90 tablet 3   oxyCODONE-acetaminophen (PERCOCET) 5-325 MG tablet Take 1 tablet by mouth every 6 (six) hours as needed for severe pain. 10 tablet 0   rosuvastatin (CRESTOR) 20 MG tablet Take 1 tablet (20 mg total) by mouth daily. 90 tablet 3   tetrahydrozoline 0.05 % ophthalmic solution Place 2 drops into both eyes daily as needed (red eyes).     Current Facility-Administered Medications  Medication Dose Route Frequency Provider Last Rate Last Admin   cyanocobalamin (VITAMIN B12) injection 1,000 mcg  1,000 mcg Intramuscular Weekly Myrlene Broker, MD   1,000 mcg at 09/03/23 1335    REVIEW OF SYSTEMS  Negative other than noted in HPI     Objective:  Objective   Vitals:   09/18/23 1500  BP: 127/78  Pulse: 71  Resp: 20  Temp: 98.1 F (36.7 C)  SpO2: 95%  Weight: 133 lb (60.3 kg)  Height: 5\' 9"  (1.753 m)   Body mass index is 19.64 kg/m.  Physical Exam General: no acute distress Cardiac: hemodynamically stable, nontachycardic Pulm: normal work of breathing Neuro: alert, no focal deficit Extremities: Minimal edema to left ankle, no cyanosis or wounds Vascular:   Left: Palpable femoral, DP and PT   Data: ABI: +---------+------------------+-----+--------+--------+  Right   Rt Pressure (mmHg)IndexWaveformComment   +---------+------------------+-----+--------+--------+  Brachial 151                                      +---------+------------------+-----+--------+--------+  ATA     126               0.80 biphasic          +---------+------------------+-----+--------+--------+  PTA     117               0.74 biphasic           +---------+------------------+-----+--------+--------+  Great Toe79                0.50 Abnormal          +---------+------------------+-----+--------+--------+   +---------+------------------+-----+---------+-------+  Left    Lt Pressure (mmHg)IndexWaveform Comment  +---------+------------------+-----+---------+-------+  Brachial 158                                      +---------+------------------+-----+---------+-------+  ATA     160               1.01 triphasic         +---------+------------------+-----+---------+-------+  PTA     156               0.99 triphasic         +---------+------------------+-----+---------+-------+  Great Toe107               0.68 Normal            +---------+------------------+-----+---------+-------+   Left lower extremity duplex Widely patent bypass graft with triphasic waveforms      Assessment/Plan:     Eddie Chan is a 62 y.o. male who is status post left common femoral to below-knee popliteal artery bypass with PTFE on 06/30/2023 Dr. Durwin Nora.  He is having some swelling and pain of his ankle along with some intermittent numbness in the foot.  I explained that the numbness may never 100% resolve although should get better with time.  I also explained that regarding his mild swelling he should use compression stocking during the day and elevate at night. I encouraged tobacco cessation.  Continue with originally planned follow-up Follow-up with PA clinic in 3 months with repeat ABI and left lower extremity duplex     Recommendations to optimize cardiovascular risk: Abstinence from all tobacco products. Blood glucose control with goal A1c < 7%. Blood pressure control with goal blood pressure < 140/90 mmHg. Lipid reduction therapy with goal LDL-C <100 mg/dL  Aspirin 81mg  PO QD.  Atorvastatin 40-80mg  PO QD (or other "high intensity" statin therapy).     Daria Pastures MD [facility]

## 2023-09-21 ENCOUNTER — Ambulatory Visit (HOSPITAL_COMMUNITY): Payer: BLUE CROSS/BLUE SHIELD

## 2023-09-22 ENCOUNTER — Other Ambulatory Visit: Payer: Self-pay

## 2023-09-22 DIAGNOSIS — I739 Peripheral vascular disease, unspecified: Secondary | ICD-10-CM

## 2023-10-23 ENCOUNTER — Other Ambulatory Visit: Payer: Self-pay | Admitting: Internal Medicine

## 2023-11-10 ENCOUNTER — Ambulatory Visit (HOSPITAL_COMMUNITY): Payer: BLUE CROSS/BLUE SHIELD

## 2023-11-10 ENCOUNTER — Ambulatory Visit: Payer: BLUE CROSS/BLUE SHIELD

## 2024-01-08 ENCOUNTER — Telehealth: Payer: Self-pay

## 2024-01-08 ENCOUNTER — Ambulatory Visit (INDEPENDENT_AMBULATORY_CARE_PROVIDER_SITE_OTHER): Payer: BLUE CROSS/BLUE SHIELD | Admitting: Physician Assistant

## 2024-01-08 ENCOUNTER — Ambulatory Visit (INDEPENDENT_AMBULATORY_CARE_PROVIDER_SITE_OTHER)
Admission: RE | Admit: 2024-01-08 | Discharge: 2024-01-08 | Disposition: A | Discharge status: Home / Self Care | Payer: BLUE CROSS/BLUE SHIELD | Source: Ambulatory Visit | Attending: Surgery | Admitting: Surgery

## 2024-01-08 ENCOUNTER — Ambulatory Visit (HOSPITAL_COMMUNITY)
Admission: RE | Admit: 2024-01-08 | Discharge: 2024-01-08 | Disposition: A | Discharge status: Home / Self Care | Payer: BLUE CROSS/BLUE SHIELD | Source: Ambulatory Visit | Attending: Vascular Surgery | Admitting: Vascular Surgery

## 2024-01-08 ENCOUNTER — Other Ambulatory Visit: Payer: Self-pay

## 2024-01-08 VITALS — BP 137/80 | HR 68 | Temp 98.1°F | Ht 69.0 in | Wt 133.3 lb

## 2024-01-08 DIAGNOSIS — Z95828 Presence of other vascular implants and grafts: Secondary | ICD-10-CM

## 2024-01-08 DIAGNOSIS — I70211 Atherosclerosis of native arteries of extremities with intermittent claudication, right leg: Secondary | ICD-10-CM

## 2024-01-08 DIAGNOSIS — I739 Peripheral vascular disease, unspecified: Secondary | ICD-10-CM

## 2024-01-08 DIAGNOSIS — F17200 Nicotine dependence, unspecified, uncomplicated: Secondary | ICD-10-CM

## 2024-01-08 DIAGNOSIS — M7989 Other specified soft tissue disorders: Secondary | ICD-10-CM

## 2024-01-08 LAB — VAS US ABI WITH/WO TBI
Left ABI: 0.96
Right ABI: 0.75

## 2024-01-08 NOTE — Telephone Encounter (Signed)
 Attempted to call for surgery scheduling. LVM

## 2024-01-08 NOTE — Progress Notes (Signed)
 Office Note     CC:  follow up Requesting Provider:  Myrlene Broker, *  HPI: Eddie Chan is a 63 y.o. (02-26-1961) male who presents for surveillance of PAD.  He underwent left iliofemoral endarterectomy with bovine patch angioplasty on 07/18/2022 by Dr. Edilia Bo.  He then underwent left common femoral artery to below the knee palpable bypass with PTFE on 06/30/2023 by Dr. Edilia Bo due to left foot rest pain.  Rest pain has resolved however he still complains of pain in the leg due to swelling.  He denies claudication in the left lower extremity or tissue loss.  Today he is also complaining of worsening claudication of the right lower extremity.  This affects him on a day-to-day basis and impacts his day-to-day life.  He does not have rest pain or tissue loss on the right lower extremity.  Angiography from 06/22/2023 demonstrated mild diffuse disease in the common femoral and superficial femoral artery as well as mild disease in the popliteal artery behind the knee however he did have three-vessel runoff.  He is on aspirin, Plavix, statin daily.  He has no interest in smoking cessation.  Past Medical History:  Diagnosis Date   Hyperlipidemia    Peripheral arterial disease (HCC)     Past Surgical History:  Procedure Laterality Date   ABDOMINAL AORTOGRAM W/LOWER EXTREMITY Bilateral 10/12/2020   Procedure: ABDOMINAL AORTOGRAM W/LOWER EXTREMITY;  Surgeon: Chuck Hint, MD;  Location: [facility] INVASIVE CV LAB;  Service: Cardiovascular;  Laterality: Bilateral;   ABDOMINAL AORTOGRAM W/LOWER EXTREMITY N/A 06/27/2022   Procedure: ABDOMINAL AORTOGRAM W/LOWER EXTREMITY;  Surgeon: Chuck Hint, MD;  Location: [facility] INVASIVE CV LAB;  Service: Cardiovascular;  Laterality: N/A;   ABDOMINAL AORTOGRAM W/LOWER EXTREMITY Bilateral 06/22/2023   Procedure: ABDOMINAL AORTOGRAM W/LOWER EXTREMITY;  Surgeon: Chuck Hint, MD;  Location: [facility] INVASIVE CV LAB;  Service: Cardiovascular;   Laterality: Bilateral;   APPLICATION OF WOUND VAC Left 06/30/2023   Procedure: APPLICATION OF WOUND VAC;  Surgeon: Chuck Hint, MD;  Location: [facility] OR;  Service: Vascular;  Laterality: Left;   ENDARTERECTOMY FEMORAL Left 07/18/2022   Procedure: LEFT COMMON FEMORAL ENDARTERECTOMY;  Surgeon: Chuck Hint, MD;  Location: [facility] OR;  Service: Vascular;  Laterality: Left;   FEMORAL-POPLITEAL BYPASS GRAFT Left 06/30/2023   Procedure: LEFT FEMORAL-BELOW KNEE POPLITEAL ARTERY BYPASS WITH x 80CM RINGED PTFE;  Surgeon: Chuck Hint, MD;  Location: [facility] OR;  Service: Vascular;  Laterality: Left;   Left iliac stenting     PATCH ANGIOPLASTY Left 07/18/2022   Procedure: PATCH ANGIOPLASTY OF LEFT FEMORAL ARTERY USING BOVINE PERICARDIUM PATCH;  Surgeon: Chuck Hint, MD;  Location: [facility] OR;  Service: Vascular;  Laterality: Left;   PERIPHERAL VASCULAR INTERVENTION Left 10/12/2020   Procedure: PERIPHERAL VASCULAR INTERVENTION;  Surgeon: Chuck Hint, MD;  Location: [facility] INVASIVE CV LAB;  Service: Cardiovascular;  Laterality: Left;  SFA   SHOULDER SURGERY Left    TONSILLECTOMY      Social History   Socioeconomic History   Marital status: Married    Spouse name: Eddie Chan   Number of children: 2   Years of education: 7   Highest education level: Not on file  Occupational History   Occupation: truck driver  Tobacco Use   Smoking status: Every Day    Current packs/day: 1.50    Average packs/day: 1.5 packs/day for 40.0 years (60.0 ttl pk-yrs)    Types: Cigarettes   Smokeless tobacco: Never  Vaping Use  Vaping status: Never Used  Substance and Sexual Activity   Alcohol use: Yes    Alcohol/week: 7.0 standard drinks of alcohol    Types: 7 Cans of beer per week    Comment: social, 2 beers nightly (12 oz)    Drug use: No   Sexual activity: Yes    Birth control/protection: None  Other Topics Concern   Not on file  Social History Narrative   He drives a dump truck.   Lives at wife and mother in law in a one story home.    He has two children.   Highest level of education: 7th grade   Social Drivers of Corporate investment banker Strain: Not on file  Food Insecurity: No Food Insecurity (07/08/2023)   Hunger Vital Sign    Worried About Running Out of Food in the Last Year: Never true    Ran Out of Food in the Last Year: Never true  Transportation Needs: No Transportation Needs (07/08/2023)   PRAPARE - Administrator, Civil Service (Medical): No    Lack of Transportation (Non-Medical): No  Physical Activity: Not on file  Stress: Not on file  Social Connections: Not on file  Intimate Partner Violence: Not on file    Family History  Problem Relation Age of Onset   Cancer Mother        lung cancer   Heart disease Father    Diabetes Father     Current Outpatient Medications  Medication Sig Dispense Refill   acetaminophen (TYLENOL) 500 MG tablet Take 1,000 mg by mouth 2 (two) times daily.     albuterol (PROVENTIL) (2.5 MG/3ML) 0.083% nebulizer solution Take 3 mLs (2.5 mg total) by nebulization every 2 (two) hours as needed for wheezing or shortness of breath. 150 mL 1   albuterol (VENTOLIN HFA) 108 (90 Base) MCG/ACT inhaler INHALE 1 PUFF INTO THE LUNGS EVERY 6 HOURS AS NEEDED FOR WHEEZING OR SHORTNESS OF BREATH. 6.7 each 2   aspirin EC 81 MG tablet Take 1 tablet (81 mg total) by mouth daily. Swallow whole. 90 tablet 3   clopidogrel (PLAVIX) 75 MG tablet Take 1 tablet (75 mg total) by mouth daily. 90 tablet 1   cyanocobalamin (VITAMIN B12) 1000 MCG tablet Take 1 tablet (1,000 mcg total) by mouth daily. 90 tablet 3   oxyCODONE-acetaminophen (PERCOCET) 5-325 MG tablet Take 1 tablet by mouth every 6 (six) hours as needed for severe pain. (Patient not taking: Reported on 01/08/2024) 10 tablet 0   rosuvastatin (CRESTOR) 20 MG tablet Take 1 tablet (20 mg total) by mouth daily. 90 tablet 3   tetrahydrozoline 0.05 % ophthalmic solution Place 2 drops  into both eyes daily as needed (red eyes).     No current facility-administered medications for this visit.    Allergies  Allergen Reactions   Penicillins Other (See Comments)    Break out Childhood Reaction      REVIEW OF SYSTEMS:   X denotes positive finding, denotes negative finding Cardiac  Comments:  Chest pain or chest pressure:    Shortness of breath upon exertion:    Short of breath when lying flat:    Irregular heart rhythm:        Vascular    Pain in calf, thigh, or hip brought on by ambulation:    Pain in feet at night that wakes you up from your sleep:     Blood clot in your veins:    Leg swelling:  Pulmonary    Oxygen at home:    Productive cough:     Wheezing:         Neurologic    Sudden weakness in arms or legs:     Sudden numbness in arms or legs:     Sudden onset of difficulty speaking or slurred speech:    Temporary loss of vision in one eye:     Problems with dizziness:         Gastrointestinal    Blood in stool:     Vomited blood:         Genitourinary    Burning when urinating:     Blood in urine:        Psychiatric    Major depression:         Hematologic    Bleeding problems:    Problems with blood clotting too easily:        Skin    Rashes or ulcers:        Constitutional    Fever or chills:      PHYSICAL EXAMINATION:  Vitals:   01/08/24 1018  BP: 137/80  Pulse: 68  Temp: 98.1 F (36.7 C)  SpO2: 94%  Weight: 133 lb 4.8 oz (60.5 kg)  Height: 5\' 9"  (1.753 m)    General:  WDWN in NAD; vital signs documented above Gait: Not observed HENT: WNL, normocephalic Pulmonary: normal non-labored breathing , without Rales, rhonchi,  wheezing Cardiac: regular HR Abdomen: soft, NT, no masses Skin: without rashes Vascular Exam/Pulses: palpable L AT and PT. Absent R pedal pulses Extremities: without ischemic changes, without Gangrene , without cellulitis; without open wounds;  Musculoskeletal: no muscle wasting or  atrophy  Neurologic: A&O X 3 Psychiatric:  The pt has Normal affect.   Non-Invasive Vascular Imaging:   Widely patent left lower extremity bypass by duplex  ABI/TBIToday's ABIToday's TBIPrevious ABIPrevious TBI  +-------+-----------+-----------+------------+------------+  Right 0.75       0.51       0.80        0.50          +-------+-----------+-----------+------------+------------+  Left  0.96       0.62       1.01        0.68          +-------+-----------+-----------+------------+------------+    ASSESSMENT/PLAN:: 63 y.o. male here for surveillance of PAD with history of left iliofemoral endarterectomy and more recently had left femoral to below the knee popliteal bypass  Left lower extremity well-perfused with palpable PT and AT pulses.  Duplex demonstrates widely patent bypass without any areas of hemodynamically significant stenosis.  He does complain of bothersome swelling especially at the level of the left ankle.  He was measured for and prescribed knee-high 15 to 20 mmHg compression socks to wear on a daily basis.  Continue aspirin, Plavix, statin daily.  Encouraged smoking cessation however he has no interest in quitting.  We will repeat bypass surveillance in 6 months.  Unfortunately he is complaining of worsening right lower extremity claudication symptoms.  He states symptoms are severe enough to impact his day-to-day life.  He is without rest pain or tissue loss currently.  Plan will be aortogram with focus on the right leg with possible angioplasty and stenting of the femoral/popliteal, possible angioplasty of the tibial arteries.  This will be performed on a Friday with Dr. Lenell Antu as this is the only day during the week Mr. Cudmore is available.  Mr. Brigance  is also adamantly against having another open operation.  If there are no endovascular revascularization options, he will try to tolerate his current symptoms.  Emilie Rutter, PA-C [facility] [phone]  Clinic MD:   Hetty Blend

## 2024-01-11 ENCOUNTER — Telehealth: Payer: Self-pay

## 2024-01-11 NOTE — Telephone Encounter (Signed)
 Spoke to patient's wife, Aggie Cosier, who stated that patient is out of town working and will need to cancel aortogram at this time.  No R/S - will call back at a later date.

## 2024-01-14 ENCOUNTER — Other Ambulatory Visit: Payer: Self-pay | Admitting: *Deleted

## 2024-01-14 DIAGNOSIS — I714 Abdominal aortic aneurysm, without rupture, unspecified: Secondary | ICD-10-CM

## 2024-01-14 DIAGNOSIS — I739 Peripheral vascular disease, unspecified: Secondary | ICD-10-CM

## 2024-01-14 DIAGNOSIS — Z95828 Presence of other vascular implants and grafts: Secondary | ICD-10-CM

## 2024-01-14 DIAGNOSIS — I70222 Atherosclerosis of native arteries of extremities with rest pain, left leg: Secondary | ICD-10-CM

## 2024-01-15 ENCOUNTER — Ambulatory Visit (HOSPITAL_COMMUNITY): Admit: 2024-01-15 | Admitting: Vascular Surgery

## 2024-01-15 ENCOUNTER — Encounter (HOSPITAL_COMMUNITY): Payer: Self-pay

## 2024-01-15 SURGERY — ABDOMINAL AORTOGRAM
Anesthesia: LOCAL

## 2024-01-18 ENCOUNTER — Other Ambulatory Visit: Payer: Self-pay | Admitting: Internal Medicine

## 2024-01-25 ENCOUNTER — Ambulatory Visit: Payer: Self-pay

## 2024-01-25 NOTE — Telephone Encounter (Addendum)
 Copied From CRM [id number]. Reason for Triage: patient was diagnosed with COVID yesterday and he is also allergic to pollen. He wants to know what he can take or shouldn't be taking etc to help him. His wife is with him and she is the one that called, callback number [phone]  First attempt made; left voice message to call back.   Second and third call made; no answer and voice mail full.

## 2024-01-25 NOTE — Telephone Encounter (Signed)
 Chief Complaint: Allergy control, Covid Positive Symptoms: Coughing, allergy flare up Frequency: 2 days Pertinent Negatives: Patient denies fever Disposition: [] ED /[] Urgent Care (no appt availability in office) / [] Appointment(In office/virtual)/ []  [place] [facility]/ [x] Home Care/ [] Refused Recommended Disposition /[] [place] [facility]/ []  Follow-up with PCP Additional Notes: Patient called stating he tested positive for Covid yesterday at Urgent Care and was prescribed Paxlovid but the cost was too high for him to fill. Patient asking for home card advice on coughing and allergy symptoms, stating the allergy symptoms from pollen (that started before Covid) are what is bothering him most. Patient advised to try Flonase and non-drowsy antihistamine, along with continuing the Prednisone that was prescribed at Urgent Care that he did have filled. Patient confirmed understanding.    Copied from CRM [id number]. Topic: Clinical - Pink Word Triage >> Jan 25, 2024 11:49 AM Melissa C wrote: Reason for Triage: patient was diagnosed with COVID yesterday and he is also allergic to pollen. He wants to know what he can take or shouldn't be taking etc to help him. His wife is with him and she is the one that called, callback number [phone] Reason for Disposition  [1] COVID-19 infection suspected by caller or triager AND [2] mild symptoms (cough, fever, or others) AND [3] negative COVID-19 rapid test  Answer Assessment - Initial Assessment Questions 1. COVID-19 DIAGNOSIS: "How do you know that you have COVID?" (e.g., positive lab test or self-test, diagnosed by doctor or NP/PA, symptoms after exposure).     Patient went to Urgent Care yesterday - tested positive for Covid 2. COVID-19 EXPOSURE: "Was there any known exposure to COVID before the symptoms began?" CDC Definition of close contact: within 6 feet (2 meters) for a total of 15 minutes or more over a 24-hour period.      No 3. ONSET: "When  did the COVID-19 symptoms start?"      Two days ago 4. WORST SYMPTOM: "What is your worst symptom?" (e.g., cough, fever, shortness of breath, muscle aches)     Coughing, weakness 5. COUGH: "Do you have a cough?" If Yes, ask: "How bad is the cough?"       Yes with phlegm 6. FEVER: "Do you have a fever?" If Yes, ask: "What is your temperature, how was it measured, and when did it start?"     No 7. RESPIRATORY STATUS: "Describe your breathing?" (e.g., normal; shortness of breath, wheezing, unable to speak)      On and off 8. BETTER-SAME-WORSE: "Are you getting better, staying the same or getting worse compared to yesterday?"  If getting worse, ask, "In what way?"     About the same 9. OTHER SYMPTOMS: "Do you have any other symptoms?"  (e.g., chills, fatigue, headache, loss of smell or taste, muscle pain, sore throat)     Weakness, coughing, chills 10. HIGH RISK DISEASE: "Do you have any chronic medical problems?" (e.g., asthma, heart or lung disease, weak immune system, obesity, etc.)       COPD 11. VACCINE: "Have you had the COVID-19 vaccine?" If Yes, ask: "Which one, how many shots, when did you get it?"       No 13. O2 SATURATION MONITOR:  "Do you use an oxygen saturation monitor (pulse oximeter) at home?" If Yes, ask "What is your reading (oxygen level) today?" "What is your usual oxygen saturation reading?" (e.g., 95%)       90-91 (usually 96-97%)  Protocols used: Coronavirus (COVID-19) Diagnosed or Suspected-A-AH

## 2024-01-28 ENCOUNTER — Ambulatory Visit: Payer: Self-pay

## 2024-01-28 NOTE — Telephone Encounter (Signed)
 Chief Complaint: SOB Symptoms: SOB, CP, cough, COVID Frequency: COVID+ on 3/30 Pertinent Negatives: Patient denies N/V/D, palpitations, dizziness Disposition: [x] ED /[] Urgent Care (no appt availability in office) / [] Appointment(In office/virtual)/ []  [place] [facility]/ [] Home Care/ [] Refused Recommended Disposition /[] [place] [facility]/ []  Follow-up with PCP Additional Notes: Wife calls on behalf of pt. Wife on her way to work, patient at home with two family members. Wife states pt tested positive for COVID 3/30. Prescribed Paxlovid which he could not afford. Wife states he is taking prednisone but getting worse. Pt has a hx of COPD and is SOB. Wife states he has never been SOB before. Pt is SOB at rest. Wife endorses pt is hunching over to breathe. Wife also reports pt reports chest pain that is constant and began this AM. Wife states CP is likely a pulled muscle from coughing. Pt is frequently coughing up white mucus.   RN advised wife pt needs to go to the ED. Wife agreeable but is not with the pt. RN called the pt to try and contact him directly. Pt did not answer, RN left  a voicemail. RN called wife again, stated that the pt did not answer the phone. Wife states she will call the pt. RN advised wife to let pt know he needs to go to the ED. RN advised wife RN would like to call 911 to get him to the hospital, wife declined stating they live in [place] and pt will not want to go to [facility]. Wife states pt is at home with an elderly family member that can't be left alone and a sister and does not think either one of them can take him to the hospital. Wife stated she will have to leave work and take him. Wife states she will call the pt and call us back.     Copied from CRM [id number]. Topic: Clinical - Red Word Triage >> Jan 28, 2024  2:38 PM Martinique E wrote: [facility] that prompted transfer to Nurse Triage: Patient's wife, Rosey Bath, called in stating that her husband is having a  worsening cough which is leading to chest pain, and weakness. O2 stats read 88, and 91 today 4/3. Reason for Disposition  [1] MODERATE difficulty breathing (e.g., speaks in phrases, SOB even at rest, pulse 100-120) AND [2] NEW-onset or WORSE than normal  Answer Assessment - Initial Assessment Questions 1. RESPIRATORY STATUS: "Describe your breathing?" (e.g., wheezing, shortness of breath, unable to speak, severe coughing)      Wife states pt is hunched over struggling to get breaths in with wheezing 2. ONSET: "When did this breathing problem begin?"      "All the time" - hx of COPD, COVID+ 3/30 3. PATTERN "Does the difficult breathing come and go, or has it been constant since it started?"      Constant 4. SEVERITY: "How bad is your breathing?" (e.g., mild, moderate, severe)    - MILD: No SOB at rest, mild SOB with walking, speaks normally in sentences, can lie down, no retractions, pulse < 100.    - MODERATE: SOB at rest, SOB with minimal exertion and prefers to sit, cannot lie down flat, speaks in phrases, mild retractions, audible wheezing, pulse 100-120.    - SEVERE: Very SOB at rest, speaks in single words, struggling to breathe, sitting hunched forward, retractions, pulse > 120      Moderate - SOB at rest 5. RECURRENT SYMPTOM: "Have you had difficulty breathing before?" If Yes, ask: "When  was the last time?" and "What happened that time?"      No 6. CARDIAC HISTORY: "Do you have any history of heart disease?" (e.g., heart attack, angina, bypass surgery, angioplasty)      PAD 7. LUNG HISTORY: "Do you have any history of lung disease?"  (e.g., pulmonary embolus, asthma, emphysema)     COPD 8. CAUSE: "What do you think is causing the breathing problem?"      Possibly COVID 9. OTHER SYMPTOMS: "Do you have any other symptoms? (e.g., dizziness, runny nose, cough, chest pain, fever)     COVID+. Could not afford Paxlovid. Wife states pt is taking prednisone and he is almost done it. Prednisone  is "making him nervous." Cough with "thick liquid." Mucus is white. Wife states pt's chest hurts from so much coughing. Pt thinks he might have pulled a muscle in his chest. Chest pain is constant, started today. Denies heart palpitations. Denies dizziness. Endorses weakness. Denies N/V/D. 10. O2 SATURATION MONITOR:  "Do you use an oxygen saturation monitor (pulse oximeter) at home?" If Yes, ask: "What is your reading (oxygen level) today?" "What is your usual oxygen saturation reading?" (e.g., 95%)       O2 was 91 today, then an hour later O2 sat went to 88  Protocols used: Breathing Difficulty-A-AH

## 2024-02-01 ENCOUNTER — Ambulatory Visit: Payer: Self-pay | Admitting: Internal Medicine

## 2024-02-01 NOTE — Telephone Encounter (Signed)
 Pt has been scheduled.

## 2024-02-01 NOTE — Telephone Encounter (Signed)
 Copied from CRM [id number]. Topic: Clinical - Red Word Triage >> Feb 01, 2024  9:33 AM Albin Felling L wrote: Red Word that prompted transfer to Nurse Triage: oxygen 93%-95%, congestion still, cough   Chief Complaint: cough; covid Symptoms: productive cough, mild SOB when coughing, O2 93-95% Frequency: constant Pertinent Negatives: Patient denies fever Disposition: [] ED /[] Urgent Care (no appt availability in office) / [x] Appointment(In office/virtual)/ []  [place] [facility]/ [] Home Care/ [x] Refused Recommended Disposition /[] [place] [facility]/ [x]  Follow-up with PCP Additional Notes: Spoke with wife, she sates that patient is doing better than last week but is still having productive coughs. She states he has returned back to work today so unable to make appt today. Would like to schedule for Wednesday., RN advised pt should be see within 24 hours (by Tuesday). Wife still prefers Wednesday. Will route to clinic [facility] for f/u and scheduling. Reason for Disposition  [1] Continuous (nonstop) coughing interferes with work or school AND [2] no improvement using cough treatment per Care Advice  Answer Assessment - Initial Assessment Questions 1. COVID-19 DIAGNOSIS: "How do you know that you have COVID?" (e.g., positive lab test or self-test, diagnosed by doctor or NP/PA, symptoms after exposure).     Diagnosed at UC  2. COVID-19 EXPOSURE: "Was there any known exposure to COVID before the symptoms began?" CDC Definition of close contact: within 6 feet (2 meters) for a total of 15 minutes or more over a 24-hour period.      .  3. ONSET: "When did the COVID-19 symptoms start?"      Started on 2 weeks ago  4. WORST SYMPTOM: "What is your worst symptom?" (e.g., cough, fever, shortness of breath, muscle aches)     Cough and congestion  5. COUGH: "Do you have a cough?" If Yes, ask: "How bad is the cough?"       Yes, productive. Coughing up white mucus  6. FEVER: "Do you have a fever?" If Yes, ask:  "What is your temperature, how was it measured, and when did it start?"     No  7. RESPIRATORY STATUS: "Describe your breathing?" (e.g., normal; shortness of breath, wheezing, unable to speak)      Sometimes he is short of breath, no wheezing  8. BETTER-SAME-WORSE: "Are you getting better, staying the same or getting worse compared to yesterday?"  If getting worse, ask, "In what way?"     Getting better  9. OTHER SYMPTOMS: "Do you have any other symptoms?"  (e.g., chills, fatigue, headache, loss of smell or taste, muscle pain, sore throat)     No  10. HIGH RISK DISEASE: "Do you have any chronic medical problems?" (e.g., asthma, heart or lung disease, weak immune system, obesity, etc.)      No  11. VACCINE: "Have you had the COVID-19 vaccine?" If Yes, ask: "Which one, how many shots, when did you get it?"       No  12. PREGNANCY: "Is there any chance you are pregnant?" "When was your last menstrual period?"       N/a  13. O2 SATURATION MONITOR:  "Do you use an oxygen saturation monitor (pulse oximeter) at home?" If Yes, ask "What is your reading (oxygen level) today?" "What is your usual oxygen saturation reading?" (e.g., 95%)       93-95%  Protocols used: Coronavirus (COVID-19) Diagnosed or Suspected-A-AH

## 2024-02-03 ENCOUNTER — Encounter: Payer: Self-pay | Admitting: Internal Medicine

## 2024-02-03 ENCOUNTER — Ambulatory Visit (INDEPENDENT_AMBULATORY_CARE_PROVIDER_SITE_OTHER): Admitting: Internal Medicine

## 2024-02-03 VITALS — BP 142/80 | HR 67 | Temp 97.6°F | Ht 69.0 in | Wt 133.0 lb

## 2024-02-03 DIAGNOSIS — J441 Chronic obstructive pulmonary disease with (acute) exacerbation: Secondary | ICD-10-CM

## 2024-02-03 DIAGNOSIS — J431 Panlobular emphysema: Secondary | ICD-10-CM

## 2024-02-03 DIAGNOSIS — R030 Elevated blood-pressure reading, without diagnosis of hypertension: Secondary | ICD-10-CM

## 2024-02-03 DIAGNOSIS — J449 Chronic obstructive pulmonary disease, unspecified: Secondary | ICD-10-CM | POA: Diagnosis not present

## 2024-02-03 DIAGNOSIS — F17200 Nicotine dependence, unspecified, uncomplicated: Secondary | ICD-10-CM

## 2024-02-03 MED ORDER — PROMETHAZINE-DM 6.25-15 MG/5ML PO SYRP: 5.0000 mL | ORAL_SOLUTION | Freq: Four times a day (QID) | ORAL | 0 refills | Status: DC | PRN

## 2024-02-03 MED ORDER — METHYLPREDNISOLONE ACETATE 80 MG/ML IJ SUSP
80.0000 mg | Freq: Once | INTRAMUSCULAR | Status: AC
Start: 2024-02-03 — End: 2024-02-03
  Administered 2024-02-03: 80 mg via INTRAMUSCULAR

## 2024-02-03 MED ORDER — METHYLPREDNISOLONE 4 MG PO TBPK: ORAL_TABLET | ORAL | 0 refills | Status: DC

## 2024-02-03 MED ORDER — LEVOFLOXACIN 500 MG PO TABS: 500.0000 mg | ORAL_TABLET | Freq: Every day | ORAL | 0 refills | Status: DC

## 2024-02-03 NOTE — Patient Instructions (Signed)
 You had the steroid shot today  Please take all new medication as prescribed - the antibiotic, and medrol, and cough medicine if needed  Please continue all other medications as before, and refills have been done if requested.  Please have the pharmacy call with any other refills you may need.  Please keep your appointments with your specialists as you may have planned

## 2024-02-03 NOTE — Progress Notes (Signed)
 Patient ID: Eddie Chan, male   DOB: [birth year], 63 y.o.   MRN: [id number]        Chief Complaint: follow up copd exacerbation, smoker, elevated blood pressure       HPI:  Eddie Chan is a 63 y.o. male here with c/o 3 days onset mild to mod scant prod cough, wheezing sob doe; still smoking not ready to quit.   Pt denies fever, wt loss, night sweats, loss of appetite, or other constitutional symptoms  Pt denies chest pain orthopnea, PND, increased LE swelling, palpitations, dizziness or syncope.  Pt denies polydipsia, polyuria, or new focal neuro s/s.         Wt Readings from Last 3 Encounters:  02/03/24 133 lb (60.3 kg)  01/08/24 133 lb 4.8 oz (60.5 kg)  09/18/23 133 lb (60.3 kg)   BP Readings from Last 3 Encounters:  02/03/24 (!) 142/80  01/08/24 137/80  09/18/23 127/78         Past Medical History:  Diagnosis Date   Hyperlipidemia    Peripheral arterial disease (HCC)    Past Surgical History:  Procedure Laterality Date   ABDOMINAL AORTOGRAM W/LOWER EXTREMITY Bilateral 10/12/2020   Procedure: ABDOMINAL AORTOGRAM W/LOWER EXTREMITY;  Surgeon: Dannis Dy, MD;  Location: [facility] INVASIVE CV LAB;  Service: Cardiovascular;  Laterality: Bilateral;   ABDOMINAL AORTOGRAM W/LOWER EXTREMITY N/A 06/27/2022   Procedure: ABDOMINAL AORTOGRAM W/LOWER EXTREMITY;  Surgeon: Dannis Dy, MD;  Location: [facility] INVASIVE CV LAB;  Service: Cardiovascular;  Laterality: N/A;   ABDOMINAL AORTOGRAM W/LOWER EXTREMITY Bilateral 06/22/2023   Procedure: ABDOMINAL AORTOGRAM W/LOWER EXTREMITY;  Surgeon: Dannis Dy, MD;  Location: [facility] INVASIVE CV LAB;  Service: Cardiovascular;  Laterality: Bilateral;   APPLICATION OF WOUND VAC Left 06/30/2023   Procedure: APPLICATION OF WOUND VAC;  Surgeon: Dannis Dy, MD;  Location: [facility] OR;  Service: Vascular;  Laterality: Left;   ENDARTERECTOMY FEMORAL Left 07/18/2022   Procedure: LEFT COMMON FEMORAL ENDARTERECTOMY;  Surgeon: Dannis Dy, MD;  Location: [facility] OR;  Service: Vascular;  Laterality: Left;   FEMORAL-POPLITEAL BYPASS GRAFT Left 06/30/2023   Procedure: LEFT FEMORAL-BELOW KNEE POPLITEAL ARTERY BYPASS WITH x 80CM RINGED PTFE;  Surgeon: Dannis Dy, MD;  Location: [facility] OR;  Service: Vascular;  Laterality: Left;   Left iliac stenting     PATCH ANGIOPLASTY Left 07/18/2022   Procedure: PATCH ANGIOPLASTY OF LEFT FEMORAL ARTERY USING BOVINE PERICARDIUM PATCH;  Surgeon: Dannis Dy, MD;  Location: [facility] OR;  Service: Vascular;  Laterality: Left;   PERIPHERAL VASCULAR INTERVENTION Left 10/12/2020   Procedure: PERIPHERAL VASCULAR INTERVENTION;  Surgeon: Dannis Dy, MD;  Location: [facility] INVASIVE CV LAB;  Service: Cardiovascular;  Laterality: Left;  SFA   SHOULDER SURGERY Left    TONSILLECTOMY      reports that he has been smoking cigarettes. He has a 60 pack-year smoking history. He has never used smokeless tobacco. He reports current alcohol use of about 7.0 standard drinks of alcohol per week. He reports that he does not use drugs. family history includes Cancer in his mother; Diabetes in his father; Heart disease in his father. Allergies  Allergen Reactions   Penicillins Other (See Comments)    Break out Childhood Reaction    Current Outpatient Medications on File Prior to Visit  Medication Sig Dispense Refill   acetaminophen (TYLENOL) 500 MG tablet Take 1,000 mg by mouth 2 (two) times daily.     albuterol (PROVENTIL) (2.5 MG/3ML) 0.083% nebulizer  solution Take 3 mLs (2.5 mg total) by nebulization every 2 (two) hours as needed for wheezing or shortness of breath. 150 mL 1   albuterol (VENTOLIN HFA) 108 (90 Base) MCG/ACT inhaler INHALE 1 PUFF INTO THE LUNGS EVERY 6 HOURS AS NEEDED FOR WHEEZING OR SHORTNESS OF BREATH. 6.7 each 2   aspirin EC 81 MG tablet Take 1 tablet (81 mg total) by mouth daily. Swallow whole. 90 tablet 3   clopidogrel (PLAVIX) 75 MG tablet Take 1 tablet (75 mg total)  by mouth daily. 90 tablet 1   cyanocobalamin (VITAMIN B12) 1000 MCG tablet Take 1 tablet (1,000 mcg total) by mouth daily. 90 tablet 3   oxyCODONE-acetaminophen (PERCOCET) 5-325 MG tablet Take 1 tablet by mouth every 6 (six) hours as needed for severe pain. 10 tablet 0   rosuvastatin (CRESTOR) 20 MG tablet Take 1 tablet (20 mg total) by mouth daily. 90 tablet 3   tetrahydrozoline 0.05 % ophthalmic solution Place 2 drops into both eyes daily as needed (red eyes).     No current facility-administered medications on file prior to visit.        ROS:  All others reviewed and negative.  Objective        PE:  BP (!) 142/80 (BP Location: Right Arm, Patient Position: Sitting, Cuff Size: Normal)   Pulse 67   Temp 97.6 F (36.4 C) (Oral)   Ht 5\' 9"  (1.753 m)   Wt 133 lb (60.3 kg)   SpO2 95%   BMI 19.64 kg/m                 Constitutional: Pt appears in NAD, not using accessory muscls               HENT: Head: NCAT.                Right Ear: External ear normal.                 Left Ear: External ear normal.                Eyes: . Pupils are equal, round, and reactive to light. Conjunctivae and EOM are normal               Nose: without d/c or deformity               Neck: Neck supple. Gross normal ROM               Cardiovascular: Normal rate and regular rhythm.                 Pulmonary/Chest: Effort normal and breath sounds without rales but with mild bilateral wheezing.                Abd:  Soft, NT, ND, + BS, no organomegaly               Neurological: Pt is alert. At baseline orientation, motor grossly intact               Skin: Skin is warm. No rashes, no other new lesions, LE edema - none               Psychiatric: Pt behavior is normal without agitation   Micro: none  Cardiac tracings I have personally interpreted today:  none  Pertinent Radiological findings (summarize): none   Lab Results  Component Value Date   WBC 10.1 07/01/2023   HGB 10.3 (L) 07/01/2023  HCT 30.3 (L)  07/01/2023   PLT 122 (L) 07/01/2023   GLUCOSE 205 (H) 07/01/2023   CHOL 127 07/01/2023   TRIG 41 07/01/2023   HDL 61 07/01/2023   LDLCALC 58 07/01/2023   ALT 21 06/30/2023   AST 26 06/30/2023   NA 134 (L) 07/01/2023   K 4.2 07/01/2023   CL 96 (L) 07/01/2023   CREATININE 0.69 07/01/2023   BUN 11 07/01/2023   CO2 27 07/01/2023   TSH 0.67 08/25/2023   INR 0.9 06/30/2023   HGBA1C 5.5 08/25/2023   Assessment/Plan:  Eddie Chan is a 63 y.o. White or Caucasian [1] male with  has a past medical history of Hyperlipidemia and Peripheral arterial disease (HCC).  COPD with acute exacerbation (HCC) Mild to mod, for depomedrol 80 mg Im, medrol taper, levaquin 500  every day,  cough med prn, to f/u any worsening symptoms or concerns  Tobacco use disorder Pt counsled to quit, pt not ready  Elevated blood pressure reading Mild elevated today likely reactive, cont current med tx  Followup: Return if symptoms worsen or fail to improve.  Rosalia Colonel, MD 02/06/2024 6:55 PM [facility]

## 2024-02-05 ENCOUNTER — Telehealth: Payer: Self-pay

## 2024-02-05 NOTE — Telephone Encounter (Signed)
 Copied from CRM [id number]. Topic: Clinical - Medication Question >> Feb 05, 2024  1:27 PM Yolanda T wrote: Reason for CRM: patient spouse Rosey Bath called to get instructions on how patient should be taking methylPREDNISolone (MEDROL DOSEPAK) 4 MG TBPK tablet: 4 tab by mouth x 3 days, 2 tabs x 3 days, 1 tab x 3 days. Patient got his meds on day before yesterday and want to take his meds today

## 2024-02-06 ENCOUNTER — Encounter: Payer: Self-pay | Admitting: Internal Medicine

## 2024-02-06 DIAGNOSIS — R030 Elevated blood-pressure reading, without diagnosis of hypertension: Secondary | ICD-10-CM | POA: Insufficient documentation

## 2024-02-06 DIAGNOSIS — J441 Chronic obstructive pulmonary disease with (acute) exacerbation: Secondary | ICD-10-CM | POA: Insufficient documentation

## 2024-02-06 NOTE — Assessment & Plan Note (Signed)
 Mild elevated today likely reactive, cont current med tx

## 2024-02-06 NOTE — Assessment & Plan Note (Signed)
 Pt counsled to quit, pt not ready

## 2024-02-06 NOTE — Assessment & Plan Note (Signed)
 Mild to mod, for depomedrol 80 mg Im, medrol taper, levaquin 500  every day,  cough med prn, to f/u any worsening symptoms or concerns

## 2024-02-08 NOTE — Telephone Encounter (Signed)
 I did not prescribe but see clear instructions for use on the rx. Ok to relay that information to him.

## 2024-02-08 NOTE — Telephone Encounter (Signed)
**Note De-identified  [place] Obfuscation** Please advise 

## 2024-02-08 NOTE — Telephone Encounter (Signed)
 Called patient and advised them on it, pt wife and pt verbalized that they understood

## 2024-02-08 NOTE — Telephone Encounter (Signed)
 Ok to take as prescribed today

## 2024-02-17 ENCOUNTER — Ambulatory Visit: Payer: Self-pay

## 2024-02-17 NOTE — Telephone Encounter (Signed)
 Summary: COUGH AND CONGESTED   Copied From CRM [id number]. Reason for Triage: PT SPOUSE TERESA [phone] STATED PT IS STILL COUGHING, AND CONGESTED AND WANTS TO SEE ABOUT GETTING MORE MEDS.          Chief Complaint: cough Symptoms: productive cough  Disposition: [] ED /[] Urgent Care (no appt availability in office) / [x] Appointment(In office/virtual)/ []  [facility]/ [] Home Care/ [x] Refused Recommended Disposition /[]  Mobile Bus/ []  Follow-up with PCP Additional Notes: Wife Ammon Bales called with concerns of cough returning. Pt was seen several weeks ago and cough was" about gone", but has returned. 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 Ammon Bales stated he hasn't complained of SOB just the nagging productive cough. Mucus has been white to yellow. Denies blood.  RN offered appt today but pt is out of town driving truck. Ammon Bales is unsure when he will be back in town.  RN advised that he seek care at [facility] if symptoms worsen/prolonged trip. Ammon Bales verbalized understanding and stated she will call back to make appt. Please follow up if needed.         Reason for Disposition  SEVERE coughing spells (e.g., whooping sound after coughing, vomiting after coughing)  Answer Assessment - Initial Assessment Questions 1. ONSET: "When did the cough begin?"      Several weeks  2. SEVERITY: "How bad is the cough today?"      productive 3. SPUTUM: "Describe the color of your sputum" (none, dry cough; clear, white, yellow, green)     White-yellow 4. HEMOPTYSIS: "Are you coughing up any blood?" If so ask: "How much?" (flecks, streaks, tablespoons, etc.)     denies 5. DIFFICULTY BREATHING: "Are you having difficulty breathing?" If Yes, ask: "How bad is it?" (e.g., mild, moderate, severe)    - MILD: No SOB at rest, mild SOB with walking, speaks normally in sentences, can lie down, no retractions, pulse < 100.    - MODERATE: SOB at rest, SOB with minimal exertion and prefers to sit,  cannot lie down flat, speaks in phrases, mild retractions, audible wheezing, pulse 100-120.    - SEVERE: Very SOB at rest, speaks in single words, struggling to breathe, sitting hunched forward, retractions, pulse > 120      denies 6. FEVER: "Do you have a fever?" If Yes, ask: "What is your temperature, how was it measured, and when did it start?"     denies 7. CARDIAC HISTORY: "Do you have any history of heart disease?" (e.g., heart attack, congestive heart failure)      denies 8. LUNG HISTORY: "Do you have any history of lung disease?"  (e.g., pulmonary embolus, asthma, emphysema)     Asthma   10. OTHER SYMPTOMS: "Do you have any other symptoms?" (e.g., runny nose, wheezing, chest pain)       Runny nose  Protocols used: Cough - Acute Productive-A-AH

## 2024-02-18 NOTE — Telephone Encounter (Signed)
**Note De-identified  [place] Obfuscation** Please advise 

## 2024-02-18 NOTE — Telephone Encounter (Signed)
Lvm- 1st attempt

## 2024-02-18 NOTE — Telephone Encounter (Signed)
 Should take otc for cough. After an illness it is not uncommon for cough to linger for up to 1 month. For change in breathing needs visit.

## 2024-02-19 ENCOUNTER — Telehealth: Payer: Self-pay

## 2024-02-19 NOTE — Telephone Encounter (Signed)
 Forwarding message  Copied from CRM [id number]. Topic: General - Other >> Feb 18, 2024  4:07 PM Felizardo Hotter wrote: Reason for CRM: Pt's wife called providing message and will contact pt for appt date.

## 2024-02-19 NOTE — Telephone Encounter (Signed)
 I don't see any action needed for me here?

## 2024-04-19 ENCOUNTER — Other Ambulatory Visit: Payer: Self-pay | Admitting: Internal Medicine

## 2024-04-21 ENCOUNTER — Telehealth: Payer: Self-pay

## 2024-04-21 NOTE — Telephone Encounter (Signed)
 Pt's wife called [facility] triage line asking for an earlier appt for pt. Returned call and spoke to wife.  She wasn't exactly clear about pt's symptoms. Called pt to discuss symptoms.  No answer, left message to call back.

## 2024-04-29 ENCOUNTER — Other Ambulatory Visit: Payer: Self-pay | Admitting: Internal Medicine

## 2024-06-21 ENCOUNTER — Other Ambulatory Visit: Payer: Self-pay | Admitting: Internal Medicine

## 2024-07-12 ENCOUNTER — Ambulatory Visit

## 2024-07-12 ENCOUNTER — Ambulatory Visit (HOSPITAL_COMMUNITY)

## 2024-08-05 ENCOUNTER — Other Ambulatory Visit: Payer: Self-pay | Admitting: Internal Medicine

## 2024-09-08 ENCOUNTER — Other Ambulatory Visit: Payer: Self-pay | Admitting: Internal Medicine

## 2024-09-16 ENCOUNTER — Other Ambulatory Visit: Payer: Self-pay

## 2024-09-16 ENCOUNTER — Telehealth: Payer: Self-pay

## 2024-09-16 DIAGNOSIS — J441 Chronic obstructive pulmonary disease with (acute) exacerbation: Secondary | ICD-10-CM

## 2024-09-16 DIAGNOSIS — J449 Chronic obstructive pulmonary disease, unspecified: Secondary | ICD-10-CM

## 2024-09-16 MED ORDER — ALBUTEROL SULFATE (2.5 MG/3ML) 0.083% IN NEBU: 2.5000 mg | INHALATION_SOLUTION | RESPIRATORY_TRACT | 1 refills | Status: DC | PRN

## 2024-09-17 ENCOUNTER — Other Ambulatory Visit: Payer: Self-pay | Admitting: Internal Medicine

## 2024-09-27 ENCOUNTER — Ambulatory Visit (HOSPITAL_COMMUNITY)
Admission: RE | Admit: 2024-09-27 | Discharge: 2024-09-27 | Disposition: A | Source: Ambulatory Visit | Attending: Physician Assistant

## 2024-09-27 ENCOUNTER — Ambulatory Visit: Admitting: Physician Assistant

## 2024-09-27 VITALS — BP 130/78 | HR 72 | Temp 97.9°F | Wt 126.2 lb

## 2024-09-27 DIAGNOSIS — I714 Abdominal aortic aneurysm, without rupture, unspecified: Secondary | ICD-10-CM

## 2024-09-27 DIAGNOSIS — I70211 Atherosclerosis of native arteries of extremities with intermittent claudication, right leg: Secondary | ICD-10-CM | POA: Diagnosis not present

## 2024-09-27 DIAGNOSIS — F17200 Nicotine dependence, unspecified, uncomplicated: Secondary | ICD-10-CM

## 2024-09-27 DIAGNOSIS — I739 Peripheral vascular disease, unspecified: Secondary | ICD-10-CM

## 2024-09-27 DIAGNOSIS — I70222 Atherosclerosis of native arteries of extremities with rest pain, left leg: Secondary | ICD-10-CM

## 2024-09-27 DIAGNOSIS — Z95828 Presence of other vascular implants and grafts: Secondary | ICD-10-CM | POA: Diagnosis not present

## 2024-09-27 LAB — VAS US ABI WITH/WO TBI
Left ABI: 0.99
Right ABI: 0.86

## 2024-09-27 NOTE — Progress Notes (Signed)
 Office Note     CC:  follow up Requesting Provider:  Rollene Almarie LABOR, *  HPI: Eddie Chan is a 63 y.o. (1961-07-27) male who presents for routine follow up of PAD. He most recently underwent left common femoral artery to below the knee popliteal bypass with PTFE on 06/30/2023 by Dr. Eliza due to left foot rest pain. Prior to this he had a left iliofemoral endarterectomy with bovine patch angioplasty on 07/18/2022 by Dr. Eliza. Since his LLE interventions he has had resolution of his rest pain and no claudication or tissue loss. He has however developed claudication symptoms on his RLE. On prior Angiography in August of 2024 he had some diffuse disease in the CF, SFA, and popliteal arteries but patent runoff. AT time of his last visit in March he feel his symptoms were lifestyle limiting and he was scheduled for Aortogram, however he cancelled it due to being out of town for work.  Today he is here with his wife. Overall he feels his right leg is the same as it was at time of his last visit in March. He says he can go about 270 yards before he has to stop and rest. Cramping is resolved with rest and will occur again on continued ambulation. He does not have any pain at rest or tissue loss. He has no issues with LLE. Says some residual numbness since the surgery but otherwise feels great. Swelling is improved. He still wears compression stockings and elevates daily. He does report cramping frequently in his feet at night. He admits to drinking very minimal water. He is medically managed on Aspirin , Statin and Plavix . He continues to smoke 1.5 ppd and not interested in quitting.  Past Medical History:  Diagnosis Date   Hyperlipidemia    Peripheral arterial disease     Past Surgical History:  Procedure Laterality Date   ABDOMINAL AORTOGRAM W/LOWER EXTREMITY Bilateral 10/12/2020   Procedure: ABDOMINAL AORTOGRAM W/LOWER EXTREMITY;  Surgeon: Eliza Lonni RAMAN, MD;  Location: [facility] INVASIVE  CV LAB;  Service: Cardiovascular;  Laterality: Bilateral;   ABDOMINAL AORTOGRAM W/LOWER EXTREMITY N/A 06/27/2022   Procedure: ABDOMINAL AORTOGRAM W/LOWER EXTREMITY;  Surgeon: Eliza Lonni RAMAN, MD;  Location: [facility] INVASIVE CV LAB;  Service: Cardiovascular;  Laterality: N/A;   ABDOMINAL AORTOGRAM W/LOWER EXTREMITY Bilateral 06/22/2023   Procedure: ABDOMINAL AORTOGRAM W/LOWER EXTREMITY;  Surgeon: Eliza Lonni RAMAN, MD;  Location: [facility] INVASIVE CV LAB;  Service: Cardiovascular;  Laterality: Bilateral;   APPLICATION OF WOUND VAC Left 06/30/2023   Procedure: APPLICATION OF WOUND VAC;  Surgeon: Eliza Lonni RAMAN, MD;  Location: [facility] OR;  Service: Vascular;  Laterality: Left;   ENDARTERECTOMY FEMORAL Left 07/18/2022   Procedure: LEFT COMMON FEMORAL ENDARTERECTOMY;  Surgeon: Eliza Lonni RAMAN, MD;  Location: [facility] OR;  Service: Vascular;  Laterality: Left;   FEMORAL-POPLITEAL BYPASS GRAFT Left 06/30/2023   Procedure: LEFT FEMORAL-BELOW KNEE POPLITEAL ARTERY BYPASS WITH x 80CM RINGED PTFE;  Surgeon: Eliza Lonni RAMAN, MD;  Location: [facility] OR;  Service: Vascular;  Laterality: Left;   Left iliac stenting     PATCH ANGIOPLASTY Left 07/18/2022   Procedure: PATCH ANGIOPLASTY OF LEFT FEMORAL ARTERY USING BOVINE PERICARDIUM PATCH;  Surgeon: Eliza Lonni RAMAN, MD;  Location: [facility] OR;  Service: Vascular;  Laterality: Left;   PERIPHERAL VASCULAR INTERVENTION Left 10/12/2020   Procedure: PERIPHERAL VASCULAR INTERVENTION;  Surgeon: Eliza Lonni RAMAN, MD;  Location: [facility] INVASIVE CV LAB;  Service: Cardiovascular;  Laterality: Left;  SFA   SHOULDER SURGERY Left  TONSILLECTOMY      Social History   Socioeconomic History   Marital status: Married    Spouse name: Verneita   Number of children: 2   Years of education: 7   Highest education level: Not on file  Occupational History   Occupation: truck driver  Tobacco Use   Smoking status: Every Day    Current packs/day: 1.50    Average packs/day:  1.5 packs/day for 40.0 years (60.0 ttl pk-yrs)    Types: Cigarettes   Smokeless tobacco: Never  Vaping Use   Vaping status: Never Used  Substance and Sexual Activity   Alcohol  use: Yes    Alcohol /week: 7.0 standard drinks of alcohol     Types: 7 Cans of beer per week    Comment: social, 2 beers nightly (12 oz)    Drug use: No   Sexual activity: Yes    Birth control/protection: None  Other Topics Concern   Not on file  Social History Narrative   He drives a dump truck.  Lives at wife and mother in law in a one story home.    He has two children.   Highest level of education: 7th grade   Social Drivers of Corporate Investment Banker Strain: Not on file  Food Insecurity: No Food Insecurity (07/08/2023)   Hunger Vital Sign    Worried About Running Out of Food in the Last Year: Never true    Ran Out of Food in the Last Year: Never true  Transportation Needs: No Transportation Needs (07/08/2023)   PRAPARE - Administrator, Civil Service (Medical): No    Lack of Transportation (Non-Medical): No  Physical Activity: Not on file  Stress: Not on file  Social Connections: Not on file  Intimate Partner Violence: Not on file    Family History  Problem Relation Age of Onset   Cancer Mother        lung cancer   Heart disease Father    Diabetes Father     Current Outpatient Medications  Medication Sig Dispense Refill   acetaminophen  (TYLENOL ) 500 MG tablet Take 1,000 mg by mouth 2 (two) times daily.     albuterol  (PROVENTIL ) (2.5 MG/3ML) 0.083% nebulizer solution Take 3 mLs (2.5 mg total) by nebulization every 2 (two) hours as needed for wheezing or shortness of breath. 150 mL 1   albuterol  (VENTOLIN  HFA) 108 (90 Base) MCG/ACT inhaler INHALE 1 PUFF INTO THE LUNGS EVERY 6 HOURS AS NEEDED FOR WHEEZING OR SHORTNESS OF BREATH. 6.7 each 0   aspirin  EC 81 MG tablet Take 1 tablet (81 mg total) by mouth daily. Swallow whole. 90 tablet 3   clopidogrel  (PLAVIX ) 75 MG tablet TAKE 1  TABLET BY MOUTH EVERY DAY 90 tablet 1   cyanocobalamin  (VITAMIN B12) 1000 MCG tablet TAKE 1 TABLET BY MOUTH EVERY DAY 90 tablet 3   levofloxacin  (LEVAQUIN ) 500 MG tablet Take 1 tablet (500 mg total) by mouth daily. 10 tablet 0   methylPREDNISolone  (MEDROL  DOSEPAK) 4 MG TBPK tablet 4 tab by mouth x 3 days, 2 tabs x 3 days, 1 tab x 3 days 21 tablet 0   promethazine -dextromethorphan (PROMETHAZINE -DM) 6.25-15 MG/5ML syrup Take 5 mLs by mouth 4 (four) times daily as needed. 118 mL 0   rosuvastatin  (CRESTOR ) 20 MG tablet TAKE 1 TABLET BY MOUTH EVERY DAY 90 tablet 0   tetrahydrozoline 0.05 % ophthalmic solution Place 2 drops into both eyes daily as needed (red eyes).  No current facility-administered medications for this visit.    Allergies  Allergen Reactions   Penicillins Other (See Comments)    Break out Childhood Reaction      REVIEW OF SYSTEMS:  Negative unless noted in HPI [X]  denotes positive finding, [ ]  denotes negative finding Cardiac  Comments:  Chest pain or chest pressure:    Shortness of breath upon exertion:    Short of breath when lying flat:    Irregular heart rhythm:        Vascular    Pain in calf, thigh, or hip brought on by ambulation:    Pain in feet at night that wakes you up from your sleep:     Blood clot in your veins:    Leg swelling:         Pulmonary    Oxygen at home:    Productive cough:     Wheezing:         Neurologic    Sudden weakness in arms or legs:     Sudden numbness in arms or legs:     Sudden onset of difficulty speaking or slurred speech:    Temporary loss of vision in one eye:     Problems with dizziness:         Gastrointestinal    Blood in stool:     Vomited blood:         Genitourinary    Burning when urinating:     Blood in urine:        Psychiatric    Major depression:         Hematologic    Bleeding problems:    Problems with blood clotting too easily:        Skin    Rashes or ulcers:        Constitutional     Fever or chills:      PHYSICAL EXAMINATION:  Vitals:   09/27/24 1101  BP: 130/78  Pulse: 72  Temp: 97.9 F (36.6 C)  TempSrc: Temporal  Weight: 126 lb 3.2 oz (57.2 kg)    General:  WDWN in NAD; vital signs documented above Gait: Not observed HENT: WNL, normocephalic Pulmonary: normal non-labored breathing Cardiac: regular HR Abdomen: soft Vascular Exam/Pulses: 2+ femoral, 2+ DP pulses bilaterally. Feet are warm and well perfused Extremities: without ischemic changes, without Gangrene , without cellulitis; without open wounds;  Musculoskeletal: no muscle wasting or atrophy  Neurologic: A&O X 3 Psychiatric:  The pt has Normal affect.   Non-Invasive Vascular Imaging:   +-------+-----------+-----------+------------+------------+  ABI/TBIToday's ABIToday's TBIPrevious ABIPrevious TBI  +-------+-----------+-----------+------------+------------+  Right 0.86       0.47       0.75        0.51          +-------+-----------+-----------+------------+------------+  Left  0.99       0.53       0.96        0.62          +-------+-----------+-----------+------------+------------+  Right ABIs appear increased compared to prior study on 01/08/2024. Left ABI's appear essentially unchanged compared to prior study on 01/08/2024. Bilateral TBIs have mildly decreased since previous study.     VAS US  Lower Extremity Bypass graft Duplex: Summary:  Left: Patent left femoral-popliteal bypass graft. Essentially unchanged from previous study.   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 The largest aortic measurement is 3.9 cm.  The largest aortic diameter has mildly increased compared to prior exam. Previous diameter measurement was 3.4 cm obtained on 07/23/2023.   ASSESSMENT/PLAN:: 63 y.o. male here for routine follow up of PAD. He most recently underwent left common femoral artery to below the knee  popliteal bypass with PTFE on 06/30/2023 by Dr. Eliza due to left foot rest pain. Prior to this he had a left iliofemoral endarterectomy with bovine patch angioplasty on 07/18/2022 by Dr. Eliza. Left leg remains without any claudication, rest pain or tissue loss. He is having RLE claudication symptoms but these are not lifestyle limiting.  - ABI's overall stable as well - LLE bypass graft duplex shows patent duplex -AAA duplex is stable. This can be repeated in 1 year - encourage dedicated walking regimen to promote collaterals - continue Aspirin , Statin and Plavix  - Encourage smoking cessation although not interested - he will follow up again in 6 months with LLE bypass graft duplex and ABI's   Teretha Damme, PA-C [facility] [phone]  Clinic MD:   Mag[place]

## 2024-09-29 ENCOUNTER — Other Ambulatory Visit: Payer: Self-pay | Admitting: *Deleted

## 2024-09-29 DIAGNOSIS — I739 Peripheral vascular disease, unspecified: Secondary | ICD-10-CM

## 2024-09-29 DIAGNOSIS — I70211 Atherosclerosis of native arteries of extremities with intermittent claudication, right leg: Secondary | ICD-10-CM

## 2024-09-29 DIAGNOSIS — Z95828 Presence of other vascular implants and grafts: Secondary | ICD-10-CM

## 2024-10-04 ENCOUNTER — Encounter: Admitting: Internal Medicine

## 2024-10-14 ENCOUNTER — Other Ambulatory Visit: Payer: Self-pay | Admitting: Internal Medicine

## 2024-10-22 ENCOUNTER — Other Ambulatory Visit: Payer: Self-pay | Admitting: Internal Medicine

## 2024-11-14 NOTE — Telephone Encounter (Signed)
 Pt is aware. Handicap placard has been signed and is at the front for pickup.

## 2024-11-22 ENCOUNTER — Other Ambulatory Visit: Payer: Self-pay | Admitting: Internal Medicine

## 2024-11-29 ENCOUNTER — Ambulatory Visit: Admitting: Internal Medicine

## 2024-11-29 ENCOUNTER — Encounter: Payer: Self-pay | Admitting: Internal Medicine

## 2024-11-29 VITALS — BP 138/70 | HR 72 | Temp 98.0°F | Ht 69.0 in | Wt 130.0 lb

## 2024-11-29 DIAGNOSIS — Z0000 Encounter for general adult medical examination without abnormal findings: Secondary | ICD-10-CM

## 2024-11-29 DIAGNOSIS — L309 Dermatitis, unspecified: Secondary | ICD-10-CM | POA: Insufficient documentation

## 2024-11-29 DIAGNOSIS — I739 Peripheral vascular disease, unspecified: Secondary | ICD-10-CM

## 2024-11-29 DIAGNOSIS — E782 Mixed hyperlipidemia: Secondary | ICD-10-CM

## 2024-11-29 DIAGNOSIS — L2082 Flexural eczema: Secondary | ICD-10-CM

## 2024-11-29 DIAGNOSIS — J441 Chronic obstructive pulmonary disease with (acute) exacerbation: Secondary | ICD-10-CM

## 2024-11-29 DIAGNOSIS — R030 Elevated blood-pressure reading, without diagnosis of hypertension: Secondary | ICD-10-CM

## 2024-11-29 DIAGNOSIS — F17200 Nicotine dependence, unspecified, uncomplicated: Secondary | ICD-10-CM

## 2024-11-29 DIAGNOSIS — J449 Chronic obstructive pulmonary disease, unspecified: Secondary | ICD-10-CM

## 2024-11-29 LAB — CBC
HCT: 41.5 % (ref 39.0–52.0)
Hemoglobin: 14.2 g/dL (ref 13.0–17.0)
MCHC: 34.2 g/dL (ref 30.0–36.0)
MCV: 98 fl (ref 78.0–100.0)
Platelets: 155 10*3/uL (ref 150.0–400.0)
RBC: 4.24 Mil/uL (ref 4.22–5.81)
RDW: 13.1 % (ref 11.5–15.5)
WBC: 5.3 10*3/uL (ref 4.0–10.5)

## 2024-11-29 LAB — LIPID PANEL
Cholesterol: 152 mg/dL (ref 28–200)
HDL: 89.9 mg/dL
LDL Cholesterol: 51 mg/dL (ref 10–99)
NonHDL: 62.06
Total CHOL/HDL Ratio: 2
Triglycerides: 55 mg/dL (ref 10.0–149.0)
VLDL: 11 mg/dL (ref 0.0–40.0)

## 2024-11-29 LAB — COMPREHENSIVE METABOLIC PANEL WITH GFR
ALT: 13 U/L (ref 3–53)
AST: 20 U/L (ref 5–37)
Albumin: 4.4 g/dL (ref 3.5–5.2)
Alkaline Phosphatase: 44 U/L (ref 39–117)
BUN: 9 mg/dL (ref 6–23)
CO2: 32 meq/L (ref 19–32)
Calcium: 9.6 mg/dL (ref 8.4–10.5)
Chloride: 96 meq/L (ref 96–112)
Creatinine, Ser: 0.7 mg/dL (ref 0.40–1.50)
GFR: 97.32 mL/min
Glucose, Bld: 115 mg/dL — ABNORMAL HIGH (ref 70–99)
Potassium: 4 meq/L (ref 3.5–5.1)
Sodium: 134 meq/L — ABNORMAL LOW (ref 135–145)
Total Bilirubin: 0.4 mg/dL (ref 0.2–1.2)
Total Protein: 7.1 g/dL (ref 6.0–8.3)

## 2024-11-29 LAB — HEMOGLOBIN A1C: Hgb A1c MFr Bld: 5.9 % (ref 4.6–6.5)

## 2024-11-29 MED ORDER — ALBUTEROL SULFATE (2.5 MG/3ML) 0.083% IN NEBU: 2.5000 mg | INHALATION_SOLUTION | RESPIRATORY_TRACT | 1 refills | Status: AC | PRN

## 2024-11-29 MED ORDER — TRIAMCINOLONE ACETONIDE 0.1 % EX CREA: 1.0000 | TOPICAL_CREAM | Freq: Two times a day (BID) | CUTANEOUS | 3 refills | Status: AC

## 2024-11-29 MED ORDER — CLOPIDOGREL BISULFATE 75 MG PO TABS: 75.0000 mg | ORAL_TABLET | Freq: Every day | ORAL | 3 refills | Status: AC

## 2024-11-29 MED ORDER — ROSUVASTATIN CALCIUM 20 MG PO TABS: 20.0000 mg | ORAL_TABLET | Freq: Every day | ORAL | 3 refills | Status: AC

## 2024-11-29 MED ORDER — ALBUTEROL SULFATE HFA 108 (90 BASE) MCG/ACT IN AERS: INHALATION_SPRAY | RESPIRATORY_TRACT | 11 refills | Status: DC

## 2024-11-30 ENCOUNTER — Other Ambulatory Visit: Payer: Self-pay | Admitting: Internal Medicine

## 2024-12-01 ENCOUNTER — Telehealth: Payer: Self-pay

## 2024-12-01 NOTE — Telephone Encounter (Signed)
 Copied from CRM #[id number]. Topic: Clinical - Medication Question >> Dec 01, 2024 10:41 AM Montie POUR wrote: Reason for CRM:  Spouse, Verneita, is calling to see if primary doctor will rewrite the order for albuterol  (VENTOLIN  HFA) 108 (90 Base) MCG/ACT inhaler. He is using inhaler more per day and is running out before next refill. Pharmacy said they need a new order so insurance will pay. Please call Verneita at [phone]

## 2024-12-02 ENCOUNTER — Ambulatory Visit: Payer: Self-pay | Admitting: Internal Medicine

## 2024-12-02 MED ORDER — ALBUTEROL SULFATE HFA 108 (90 BASE) MCG/ACT IN AERS: 1.0000 | INHALATION_SPRAY | RESPIRATORY_TRACT | 11 refills | Status: AC | PRN

## 2024-12-02 NOTE — Assessment & Plan Note (Signed)
 Checking lipid panel and still taking plavix  and crestor . Advised to stop smoking.

## 2024-12-02 NOTE — Assessment & Plan Note (Signed)
 Not on meds currently and BP at goal today.

## 2024-12-02 NOTE — Assessment & Plan Note (Signed)
 Flu shot declines. Pneumonia declines. Shingrix declines. Tetanus up to date. Colonoscopy declines. Counseled about sun safety and mole surveillance. Counseled about the dangers of distracted driving. Given 10 year screening recommendations.

## 2024-12-02 NOTE — Assessment & Plan Note (Signed)
 Refilled triamcinolone  cream to use.

## 2024-12-02 NOTE — Telephone Encounter (Signed)
 Spoke with wife teresa and she is aware.

## 2024-12-02 NOTE — Assessment & Plan Note (Signed)
Counseled to quit and unable to make attempt at this time.

## 2024-12-02 NOTE — Assessment & Plan Note (Signed)
 No exacerbation today but using albuterol  and nebulizers regularly. Counseled to stop smoking and he is unable to make attempt. Declines regular inhaler as he is not having significant life impacting shortness of breath.

## 2024-12-02 NOTE — Assessment & Plan Note (Signed)
Checking lipid panel and adjust statin as needed.  

## 2024-12-02 NOTE — Telephone Encounter (Signed)
 Sent in new sig and rx

## 2025-03-28 ENCOUNTER — Ambulatory Visit (HOSPITAL_COMMUNITY)

## 2025-03-28 ENCOUNTER — Ambulatory Visit
